# Patient Record
Sex: Male | Born: 1940 | Race: White | Hispanic: No | Smoking: Former smoker
Health system: Southern US, Community
[De-identification: ages and names within clinical notes are randomized; demographics above are authoritative.]

## PROBLEM LIST (undated history)

## (undated) DIAGNOSIS — K635 Polyp of colon: Secondary | ICD-10-CM

## (undated) DIAGNOSIS — N451 Epididymitis: Secondary | ICD-10-CM

## (undated) DIAGNOSIS — J0100 Acute maxillary sinusitis, unspecified: Secondary | ICD-10-CM

## (undated) DIAGNOSIS — J449 Chronic obstructive pulmonary disease, unspecified: Secondary | ICD-10-CM

## (undated) DIAGNOSIS — K602 Anal fissure, unspecified: Secondary | ICD-10-CM

## (undated) DIAGNOSIS — J069 Acute upper respiratory infection, unspecified: Secondary | ICD-10-CM

## (undated) DIAGNOSIS — T7840XA Allergy, unspecified, initial encounter: Secondary | ICD-10-CM

## (undated) DIAGNOSIS — L820 Inflamed seborrheic keratosis: Secondary | ICD-10-CM

## (undated) HISTORY — PX: HEMORRHOID BANDING: SHX5850

## (undated) HISTORY — DX: Anal fissure, unspecified: K60.2

## (undated) HISTORY — DX: Inflamed seborrheic keratosis: L82.0

## (undated) HISTORY — DX: Chronic obstructive pulmonary disease, unspecified: J44.9

## (undated) HISTORY — DX: Polyp of colon: K63.5

## (undated) HISTORY — DX: Epididymitis: N45.1

## (undated) HISTORY — DX: Hemochromatosis, unspecified: E83.119

## (undated) HISTORY — DX: Acute maxillary sinusitis, unspecified: J01.00

## (undated) HISTORY — DX: Acute upper respiratory infection, unspecified: J06.9

## (undated) HISTORY — DX: Allergy, unspecified, initial encounter: T78.40XA

---
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
## 2004-01-12 ENCOUNTER — Ambulatory Visit (HOSPITAL_COMMUNITY): Admission: RE | Admit: 2004-01-12 | Discharge: 2004-01-12 | Payer: Self-pay | Admitting: General Surgery

## 2004-01-12 ENCOUNTER — Encounter (INDEPENDENT_AMBULATORY_CARE_PROVIDER_SITE_OTHER): Payer: Self-pay | Admitting: *Deleted

## 2004-09-06 ENCOUNTER — Ambulatory Visit: Payer: Self-pay | Admitting: Internal Medicine

## 2004-10-06 ENCOUNTER — Ambulatory Visit: Payer: Self-pay | Admitting: Internal Medicine

## 2004-10-12 ENCOUNTER — Ambulatory Visit: Payer: Self-pay | Admitting: Internal Medicine

## 2004-11-03 ENCOUNTER — Ambulatory Visit: Payer: Self-pay | Admitting: Internal Medicine

## 2004-12-08 ENCOUNTER — Ambulatory Visit: Payer: Self-pay | Admitting: Internal Medicine

## 2005-01-03 ENCOUNTER — Ambulatory Visit: Payer: Self-pay | Admitting: Internal Medicine

## 2005-02-26 ENCOUNTER — Ambulatory Visit: Payer: Self-pay | Admitting: Internal Medicine

## 2005-05-25 ENCOUNTER — Ambulatory Visit: Payer: Self-pay | Admitting: Internal Medicine

## 2005-08-27 ENCOUNTER — Ambulatory Visit: Payer: Self-pay | Admitting: Internal Medicine

## 2005-11-26 ENCOUNTER — Ambulatory Visit: Payer: Self-pay | Admitting: Internal Medicine

## 2005-12-21 ENCOUNTER — Ambulatory Visit: Payer: Self-pay | Admitting: Internal Medicine

## 2006-01-21 ENCOUNTER — Ambulatory Visit: Payer: Self-pay | Admitting: Internal Medicine

## 2006-02-27 ENCOUNTER — Ambulatory Visit: Payer: Self-pay | Admitting: Internal Medicine

## 2006-03-26 ENCOUNTER — Ambulatory Visit: Payer: Self-pay | Admitting: Internal Medicine

## 2006-04-29 ENCOUNTER — Ambulatory Visit: Payer: Self-pay | Admitting: Internal Medicine

## 2006-05-27 ENCOUNTER — Ambulatory Visit: Payer: Self-pay | Admitting: Internal Medicine

## 2006-06-07 ENCOUNTER — Ambulatory Visit: Payer: Self-pay | Admitting: Family Medicine

## 2006-08-26 ENCOUNTER — Ambulatory Visit: Payer: Self-pay | Admitting: Internal Medicine

## 2006-08-26 LAB — CONVERTED CEMR LAB: Ferritin: 45.6 ng/mL (ref 22.0–322.0)

## 2006-11-25 ENCOUNTER — Ambulatory Visit: Payer: Self-pay | Admitting: Internal Medicine

## 2006-12-04 ENCOUNTER — Ambulatory Visit: Payer: Self-pay | Admitting: Internal Medicine

## 2007-01-31 ENCOUNTER — Ambulatory Visit: Payer: Self-pay | Admitting: Internal Medicine

## 2007-03-03 ENCOUNTER — Ambulatory Visit: Payer: Self-pay | Admitting: Internal Medicine

## 2007-04-02 ENCOUNTER — Ambulatory Visit: Payer: Self-pay | Admitting: Internal Medicine

## 2007-05-02 ENCOUNTER — Ambulatory Visit: Payer: Self-pay | Admitting: Internal Medicine

## 2007-05-02 LAB — CONVERTED CEMR LAB
HCT: 46.1 % (ref 39.0–52.0)
Hemoglobin: 16.3 g/dL (ref 13.0–17.0)

## 2007-06-02 ENCOUNTER — Ambulatory Visit: Payer: Self-pay | Admitting: Internal Medicine

## 2007-06-02 LAB — CONVERTED CEMR LAB
Basophils Relative: 0.3 % (ref 0.0–1.0)
Eosinophils Relative: 2.1 % (ref 0.0–5.0)
Hemoglobin: 15.6 g/dL (ref 13.0–17.0)
Monocytes Absolute: 0.6 10*3/uL (ref 0.2–0.7)
Monocytes Relative: 7.8 % (ref 3.0–11.0)
RDW: 12.6 % (ref 11.5–14.6)
WBC: 7.2 10*3/uL (ref 4.5–10.5)

## 2007-07-04 ENCOUNTER — Ambulatory Visit: Payer: Self-pay | Admitting: Internal Medicine

## 2007-07-04 LAB — CONVERTED CEMR LAB
Eosinophils Absolute: 0.2 10*3/uL (ref 0.0–0.6)
Eosinophils Relative: 3.1 % (ref 0.0–5.0)
HCT: 43 % (ref 39.0–52.0)
Lymphocytes Relative: 22.7 % (ref 12.0–46.0)
MCV: 97.8 fL (ref 78.0–100.0)
Neutro Abs: 3.2 10*3/uL (ref 1.4–7.7)
Neutrophils Relative %: 60.6 % (ref 43.0–77.0)
Platelets: 167 10*3/uL (ref 150–400)
WBC: 5.3 10*3/uL (ref 4.5–10.5)

## 2007-08-01 ENCOUNTER — Ambulatory Visit: Payer: Self-pay | Admitting: Internal Medicine

## 2007-08-15 ENCOUNTER — Ambulatory Visit: Payer: Self-pay | Admitting: Family Medicine

## 2007-08-15 DIAGNOSIS — L820 Inflamed seborrheic keratosis: Secondary | ICD-10-CM

## 2007-08-29 ENCOUNTER — Encounter (INDEPENDENT_AMBULATORY_CARE_PROVIDER_SITE_OTHER): Payer: Self-pay | Admitting: *Deleted

## 2007-08-29 ENCOUNTER — Telehealth (INDEPENDENT_AMBULATORY_CARE_PROVIDER_SITE_OTHER): Payer: Self-pay | Admitting: *Deleted

## 2007-08-29 ENCOUNTER — Ambulatory Visit: Payer: Self-pay | Admitting: Family Medicine

## 2007-08-29 LAB — CONVERTED CEMR LAB
Glucose, Bld: 87 mg/dL (ref 70–99)
Total CHOL/HDL Ratio: 3.7
Triglycerides: 112 mg/dL (ref 0–149)
VLDL: 22 mg/dL (ref 0–40)

## 2007-09-01 ENCOUNTER — Ambulatory Visit: Payer: Self-pay | Admitting: Internal Medicine

## 2007-10-02 ENCOUNTER — Ambulatory Visit: Payer: Self-pay | Admitting: Internal Medicine

## 2007-11-03 ENCOUNTER — Ambulatory Visit: Payer: Self-pay | Admitting: Internal Medicine

## 2007-12-01 ENCOUNTER — Ambulatory Visit: Payer: Self-pay | Admitting: Internal Medicine

## 2007-12-01 LAB — CONVERTED CEMR LAB: Ferritin: 22 ng/mL (ref 22.0–322.0)

## 2007-12-29 ENCOUNTER — Ambulatory Visit: Payer: Self-pay | Admitting: Internal Medicine

## 2007-12-29 LAB — CONVERTED CEMR LAB
Ferritin: 18 ng/mL — ABNORMAL LOW (ref 22.0–322.0)
HCT: 48 % (ref 39.0–52.0)

## 2008-01-19 ENCOUNTER — Ambulatory Visit: Payer: Self-pay | Admitting: Family Medicine

## 2008-01-19 DIAGNOSIS — J069 Acute upper respiratory infection, unspecified: Secondary | ICD-10-CM | POA: Insufficient documentation

## 2008-01-26 ENCOUNTER — Ambulatory Visit: Payer: Self-pay | Admitting: Internal Medicine

## 2008-01-26 LAB — CONVERTED CEMR LAB: Hemoglobin: 14.5 g/dL (ref 13.0–17.0)

## 2008-02-25 ENCOUNTER — Ambulatory Visit: Payer: Self-pay | Admitting: Internal Medicine

## 2008-04-05 ENCOUNTER — Ambulatory Visit: Payer: Self-pay | Admitting: Internal Medicine

## 2008-04-08 LAB — CONVERTED CEMR LAB
Ferritin: 10.7 ng/mL — ABNORMAL LOW (ref 22.0–322.0)
HCT: 42.4 % (ref 39.0–52.0)

## 2008-04-26 ENCOUNTER — Ambulatory Visit: Payer: Self-pay | Admitting: Internal Medicine

## 2008-04-26 LAB — CONVERTED CEMR LAB: Hemoglobin: 14.4 g/dL (ref 13.0–17.0)

## 2008-05-26 ENCOUNTER — Ambulatory Visit: Payer: Self-pay | Admitting: Internal Medicine

## 2008-05-26 LAB — CONVERTED CEMR LAB: Hemoglobin: 14 g/dL (ref 13.0–17.0)

## 2008-06-30 ENCOUNTER — Ambulatory Visit: Payer: Self-pay | Admitting: Internal Medicine

## 2008-07-01 LAB — CONVERTED CEMR LAB: Hemoglobin: 14.1 g/dL (ref 13.0–17.0)

## 2008-07-26 ENCOUNTER — Ambulatory Visit: Payer: Self-pay | Admitting: Internal Medicine

## 2008-08-30 ENCOUNTER — Ambulatory Visit: Payer: Self-pay | Admitting: Family Medicine

## 2008-09-01 ENCOUNTER — Ambulatory Visit: Payer: Self-pay | Admitting: Internal Medicine

## 2008-09-02 LAB — CONVERTED CEMR LAB
Basophils Absolute: 0 10*3/uL (ref 0.0–0.1)
Basophils Relative: 1 % (ref 0.0–3.0)
Ferritin: 9 ng/mL — ABNORMAL LOW (ref 22.0–322.0)
HCT: 40.9 % (ref 39.0–52.0)
Hemoglobin: 13.7 g/dL (ref 13.0–17.0)
MCHC: 33.5 g/dL (ref 30.0–36.0)
MCV: 90.4 fL (ref 78.0–100.0)
Monocytes Absolute: 0.5 10*3/uL (ref 0.1–1.0)
Neutro Abs: 2.9 10*3/uL (ref 1.4–7.7)
RBC: 4.53 M/uL (ref 4.22–5.81)
RDW: 13.1 % (ref 11.5–14.6)

## 2008-09-17 ENCOUNTER — Encounter (INDEPENDENT_AMBULATORY_CARE_PROVIDER_SITE_OTHER): Payer: Self-pay | Admitting: *Deleted

## 2008-09-27 ENCOUNTER — Ambulatory Visit: Payer: Self-pay | Admitting: Internal Medicine

## 2008-10-20 ENCOUNTER — Ambulatory Visit: Payer: Self-pay | Admitting: Internal Medicine

## 2008-10-20 ENCOUNTER — Encounter: Payer: Self-pay | Admitting: Internal Medicine

## 2008-10-20 HISTORY — PX: COLONOSCOPY W/ BIOPSIES: SHX1374

## 2008-10-25 ENCOUNTER — Encounter: Payer: Self-pay | Admitting: Internal Medicine

## 2008-12-24 ENCOUNTER — Ambulatory Visit: Payer: Self-pay | Admitting: Internal Medicine

## 2009-03-25 ENCOUNTER — Ambulatory Visit: Payer: Self-pay | Admitting: Internal Medicine

## 2009-03-28 LAB — CONVERTED CEMR LAB: Ferritin: 12.6 ng/mL — ABNORMAL LOW (ref 22.0–322.0)

## 2009-04-01 ENCOUNTER — Ambulatory Visit: Payer: Self-pay | Admitting: Internal Medicine

## 2009-06-28 ENCOUNTER — Ambulatory Visit: Payer: Self-pay | Admitting: Internal Medicine

## 2009-06-28 LAB — CONVERTED CEMR LAB
Ferritin: 18.2 ng/mL — ABNORMAL LOW (ref 22.0–322.0)
HCT: 44.9 % (ref 39.0–52.0)

## 2009-07-18 ENCOUNTER — Ambulatory Visit: Payer: Self-pay | Admitting: Family Medicine

## 2009-07-18 ENCOUNTER — Encounter: Payer: Self-pay | Admitting: Family Medicine

## 2009-07-18 DIAGNOSIS — R05 Cough: Secondary | ICD-10-CM

## 2009-07-20 ENCOUNTER — Encounter: Payer: Self-pay | Admitting: Family Medicine

## 2009-07-20 LAB — CONVERTED CEMR LAB
ALT: 14 units/L (ref 0–53)
Albumin: 4.1 g/dL (ref 3.5–5.2)
BUN: 15 mg/dL (ref 6–23)
Basophils Relative: 0.3 % (ref 0.0–3.0)
Bilirubin, Direct: 0.1 mg/dL (ref 0.0–0.3)
CO2: 31 meq/L (ref 19–32)
Chloride: 106 meq/L (ref 96–112)
Cholesterol: 192 mg/dL (ref 0–200)
Creatinine, Ser: 1.1 mg/dL (ref 0.4–1.5)
Eosinophils Absolute: 0.1 10*3/uL (ref 0.0–0.7)
Eosinophils Relative: 1.8 % (ref 0.0–5.0)
HCT: 44.5 % (ref 39.0–52.0)
Lymphs Abs: 1.5 10*3/uL (ref 0.7–4.0)
MCHC: 34 g/dL (ref 30.0–36.0)
MCV: 98.8 fL (ref 78.0–100.0)
Monocytes Absolute: 0.8 10*3/uL (ref 0.1–1.0)
Neutro Abs: 5.3 10*3/uL (ref 1.4–7.7)
Neutrophils Relative %: 68.6 % (ref 43.0–77.0)
PSA: 1.1 ng/mL (ref 0.10–4.00)
Potassium: 4.4 meq/L (ref 3.5–5.1)
RBC: 4.51 M/uL (ref 4.22–5.81)
TSH: 1.65 microintl units/mL (ref 0.35–5.50)
Total CHOL/HDL Ratio: 4
Total Protein: 6.8 g/dL (ref 6.0–8.3)
Triglycerides: 103 mg/dL (ref 0.0–149.0)
WBC: 7.7 10*3/uL (ref 4.5–10.5)

## 2009-07-21 ENCOUNTER — Encounter: Payer: Self-pay | Admitting: Family Medicine

## 2009-09-23 ENCOUNTER — Ambulatory Visit: Payer: Self-pay | Admitting: Internal Medicine

## 2009-09-26 LAB — CONVERTED CEMR LAB: HCT: 46.7 % (ref 39.0–52.0)

## 2009-12-23 ENCOUNTER — Ambulatory Visit: Payer: Self-pay | Admitting: Internal Medicine

## 2009-12-23 LAB — CONVERTED CEMR LAB
Basophils Absolute: 0 10*3/uL (ref 0.0–0.1)
Ferritin: 34.2 ng/mL (ref 22.0–322.0)
HCT: 47.6 % (ref 39.0–52.0)
Hemoglobin: 15.8 g/dL (ref 13.0–17.0)
Lymphs Abs: 0.8 10*3/uL (ref 0.7–4.0)
MCHC: 33.2 g/dL (ref 30.0–36.0)
MCV: 100.5 fL — ABNORMAL HIGH (ref 78.0–100.0)
Monocytes Absolute: 0.5 10*3/uL (ref 0.1–1.0)
Monocytes Relative: 9.9 % (ref 3.0–12.0)
Neutro Abs: 4.1 10*3/uL (ref 1.4–7.7)
Platelets: 181 10*3/uL (ref 150.0–400.0)
RDW: 12.3 % (ref 11.5–14.6)

## 2010-03-24 ENCOUNTER — Ambulatory Visit: Payer: Self-pay | Admitting: Internal Medicine

## 2010-03-29 LAB — CONVERTED CEMR LAB: Ferritin: 30.7 ng/mL (ref 22.0–322.0)

## 2010-06-23 ENCOUNTER — Ambulatory Visit: Payer: Self-pay | Admitting: Internal Medicine

## 2010-06-23 LAB — CONVERTED CEMR LAB
Basophils Relative: 0.3 % (ref 0.0–3.0)
Eosinophils Absolute: 0.2 10*3/uL (ref 0.0–0.7)
Eosinophils Relative: 3 % (ref 0.0–5.0)
HCT: 45 % (ref 39.0–52.0)
Lymphs Abs: 1.3 10*3/uL (ref 0.7–4.0)
MCHC: 35 g/dL (ref 30.0–36.0)
MCV: 98.7 fL (ref 78.0–100.0)
Monocytes Absolute: 0.7 10*3/uL (ref 0.1–1.0)
Platelets: 185 10*3/uL (ref 150.0–400.0)
WBC: 5.9 10*3/uL (ref 4.5–10.5)

## 2010-08-07 ENCOUNTER — Ambulatory Visit: Payer: Self-pay | Admitting: Family Medicine

## 2010-08-07 ENCOUNTER — Encounter: Payer: Self-pay | Admitting: Family Medicine

## 2010-08-25 ENCOUNTER — Ambulatory Visit: Payer: Self-pay | Admitting: Family Medicine

## 2010-08-25 LAB — CONVERTED CEMR LAB
Glucose, Urine, Semiquant: NEGATIVE
Protein, U semiquant: NEGATIVE
Urobilinogen, UA: 0.2
WBC Urine, dipstick: NEGATIVE

## 2010-08-26 ENCOUNTER — Encounter: Payer: Self-pay | Admitting: Family Medicine

## 2010-08-31 LAB — CONVERTED CEMR LAB
ALT: 14 units/L (ref 0–53)
AST: 16 units/L (ref 0–37)
BUN: 18 mg/dL (ref 6–23)
Basophils Relative: 0.1 % (ref 0.0–3.0)
CO2: 31 meq/L (ref 19–32)
Chloride: 106 meq/L (ref 96–112)
Cholesterol: 173 mg/dL (ref 0–200)
Creatinine, Ser: 1.1 mg/dL (ref 0.4–1.5)
Eosinophils Relative: 2.9 % (ref 0.0–5.0)
HCT: 48.3 % (ref 39.0–52.0)
LDL Cholesterol: 104 mg/dL — ABNORMAL HIGH (ref 0–99)
Lymphocytes Relative: 21.4 % (ref 12.0–46.0)
MCHC: 34.2 g/dL (ref 30.0–36.0)
MCV: 101.3 fL — ABNORMAL HIGH (ref 78.0–100.0)
Monocytes Relative: 13.2 % — ABNORMAL HIGH (ref 3.0–12.0)
Neutro Abs: 4.1 10*3/uL (ref 1.4–7.7)
Neutrophils Relative %: 62.4 % (ref 43.0–77.0)
Potassium: 4.3 meq/L (ref 3.5–5.1)
Total CHOL/HDL Ratio: 3
Total Protein: 6.3 g/dL (ref 6.0–8.3)
Triglycerides: 91 mg/dL (ref 0.0–149.0)

## 2010-09-06 ENCOUNTER — Ambulatory Visit: Payer: Self-pay | Admitting: Family Medicine

## 2010-09-08 LAB — CONVERTED CEMR LAB
Basophils Relative: 0.2 % (ref 0.0–3.0)
Eosinophils Absolute: 0.2 10*3/uL (ref 0.0–0.7)
Eosinophils Relative: 3.1 % (ref 0.0–5.0)
Hemoglobin: 16.1 g/dL (ref 13.0–17.0)
Lymphocytes Relative: 22.6 % (ref 12.0–46.0)
MCHC: 33.8 g/dL (ref 30.0–36.0)
MCV: 101.6 fL — ABNORMAL HIGH (ref 78.0–100.0)
Neutro Abs: 4 10*3/uL (ref 1.4–7.7)
Neutrophils Relative %: 63.1 % (ref 43.0–77.0)
RBC: 4.69 M/uL (ref 4.22–5.81)
WBC: 6.4 10*3/uL (ref 4.5–10.5)

## 2010-09-25 ENCOUNTER — Ambulatory Visit: Payer: Self-pay | Admitting: Internal Medicine

## 2010-09-26 LAB — CONVERTED CEMR LAB
Basophils Absolute: 0 10*3/uL (ref 0.0–0.1)
Basophils Relative: 0.4 % (ref 0.0–3.0)
Eosinophils Absolute: 0.2 10*3/uL (ref 0.0–0.7)
Hemoglobin: 15.4 g/dL (ref 13.0–17.0)
Lymphs Abs: 1.4 10*3/uL (ref 0.7–4.0)
MCHC: 34.9 g/dL (ref 30.0–36.0)
MCV: 99 fL (ref 78.0–100.0)
Monocytes Absolute: 0.7 10*3/uL (ref 0.1–1.0)
Neutro Abs: 6 10*3/uL (ref 1.4–7.7)
RBC: 4.46 M/uL (ref 4.22–5.81)
RDW: 13.3 % (ref 11.5–14.6)

## 2010-10-06 ENCOUNTER — Ambulatory Visit: Payer: Self-pay | Admitting: Family Medicine

## 2010-11-19 LAB — CONVERTED CEMR LAB
ALT: 17 units/L (ref 0–53)
AST: 18 units/L (ref 0–37)
Alkaline Phosphatase: 68 units/L (ref 39–117)
Basophils Absolute: 0.1 10*3/uL (ref 0.0–0.1)
Basophils Relative: 1.2 % (ref 0.0–3.0)
Bilirubin, Direct: 0.1 mg/dL (ref 0.0–0.3)
CO2: 30 meq/L (ref 19–32)
Chloride: 106 meq/L (ref 96–112)
Cholesterol: 167 mg/dL (ref 0–200)
Creatinine, Ser: 1.1 mg/dL (ref 0.4–1.5)
LDL Cholesterol: 97 mg/dL (ref 0–99)
Lymphocytes Relative: 22.5 % (ref 12.0–46.0)
MCHC: 34 g/dL (ref 30.0–36.0)
Neutrophils Relative %: 58.7 % (ref 43.0–77.0)
Platelets: 136 10*3/uL — ABNORMAL LOW (ref 150–400)
Potassium: 4.5 meq/L (ref 3.5–5.1)
RBC: 4.6 M/uL (ref 4.22–5.81)
Sodium: 141 meq/L (ref 135–145)
Total Bilirubin: 0.7 mg/dL (ref 0.3–1.2)
VLDL: 16 mg/dL (ref 0–40)

## 2010-11-21 NOTE — Assessment & Plan Note (Signed)
Summary: cpx/cbs   Vital Signs:  Patient profile:   70 year old male Height:      73 inches Weight:      181 pounds BMI:     23.97 Temp:     97.9 degrees F oral Pulse rate:   76 / minute Resp:     18 per minute BP sitting:   110 / 78  (left arm)  Vitals Entered By: Jeremy Johann CMA (August 07, 2010 1:09 PM) CC: yearly, not fasting  Does patient need assistance? Functional Status Self care, Cook/clean, Shopping, Social activities Ambulation Normal Comments Pt decline to get flu shot today pt is able to do all adls and can read and write pt sees no other doctors except Dentist[facility]  Dr Erroll Luna  Vision Screening:Left eye w/o correction: 20 / 30 Right Eye w/o correction: 20 / 25 Both eyes w/o correction:  20/ 25        Vision Entered By: Almeta Monas CMA Duncan Dull) (August 07, 2010 1:44 PM) 40db HL: Left  Right  Audiometry Comment: grossly normal    History of Present Illness: Pt here for cpe.   No complaints.    Preventive Screening-Counseling & Management  Alcohol-Tobacco     Alcohol drinks/day: <1     Alcohol type: scotch      Smoking Status: quit     Year Quit: 6/ 2008     Pack years: 50     Passive Smoke Exposure: no  Caffeine-Diet-Exercise     Caffeine use/day: 3     Does Patient Exercise: yes     Type of exercise: walking     Exercise (avg: min/session): >60     Times/week: 3  Hep-HIV-STD-Contraception     HIV Risk: no     Dental Visit-last 6 months yes     Dental Care Counseling: not indicated; dental care within six months  Safety-Violence-Falls     Seat Belt Use: 100      Sexual History:  divorced.        Drug Use:  never.    Current Medications (verified): 1)  Zostavax 25956 Unt/0.29ml Solr (Zoster Vaccine Live) .Marland Kitchen.. 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  Past Medical History: Reviewed history from 08/30/2008 and no changes required.  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 Appendectomy Inguinal herniorrhaphy Tonsillectomy  Family History: Reviewed history from 08/30/2008 and no changes required. Family History Diabetes 1st degree relative  Social History: Reviewed history from 08/30/2008 and no changes required. 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 Eyes:  Denies blurring, discharge, double vision, eye irritation, eye pain, halos, itching, light sensitivity, red eye, vision loss-1 eye, and vision loss-both eyes. ENT:  Denies decreased hearing, difficulty swallowing, ear discharge, earache, hoarseness, nasal congestion, nosebleeds, postnasal drainage, ringing in ears, sinus pressure, and sore throat. CV:  Denies bluish discoloration of lips or nails, chest pain or discomfort, difficulty  breathing at night, difficulty breathing while lying down, fainting, fatigue, leg cramps with exertion, lightheadness, near fainting, palpitations, shortness of breath with exertion, swelling of feet, swelling of hands, and weight gain. Resp:  Denies chest discomfort, chest pain with inspiration, cough, coughing up blood, excessive snoring, hypersomnolence, morning headaches, pleuritic, shortness of breath, sputum productive, and wheezing. GI:  Denies abdominal pain, bloody stools, change in bowel habits, constipation, dark tarry stools, diarrhea, excessive appetite, gas, hemorrhoids, indigestion, loss of appetite, nausea, and vomiting. GU:  Denies decreased libido, discharge, dysuria, erectile dysfunction, genital sores, hematuria, incontinence, nocturia, urinary frequency, and urinary hesitancy. MS:  Denies joint pain, joint redness, joint swelling, loss of strength, low back pain, mid back pain, muscle aches, muscle , cramps, muscle weakness, stiffness, and thoracic pain. Derm:  Denies changes in color of skin, changes in nail beds, dryness, excessive perspiration, flushing, hair loss, insect bite(s), itching, lesion(s), poor wound healing, and rash. Neuro:  Denies brief paralysis, difficulty with concentration, disturbances in coordination, falling down, headaches, inability to speak, memory loss, numbness, poor balance, seizures, sensation of room spinning, tingling, tremors, visual disturbances, and weakness. Psych:  Denies alternate hallucination ( auditory/visual), anxiety, depression, easily angered, easily tearful, irritability, mental problems, panic attacks, sense of great danger, suicidal thoughts/plans, thoughts of violence, unusual visions or sounds, and thoughts /plans of harming others. Endo:  Denies cold intolerance, excessive hunger, excessive thirst, excessive urination, heat intolerance, polyuria, and weight change. Heme:  Denies abnormal bruising, bleeding, enlarge lymph nodes, fevers,  pallor, and skin discoloration. Allergy:  Denies hives or rash, itching eyes, persistent infections, seasonal allergies, and sneezing.  Physical Exam  General:  Well-developed,well-nourished,in no acute distress; alert,appropriate and cooperative throughout examination Head:  Normocephalic and atraumatic without obvious abnormalities. No apparent alopecia or balding. Eyes:  pupils equal, pupils round, and pupils reactive to light.   Ears:  External ear exam shows no significant lesions or deformities.  Otoscopic examination reveals clear canals, tympanic membranes are intact bilaterally without bulging, retraction, inflammation or discharge. Hearing is grossly normal bilaterally. Nose:  External nasal examination shows no deformity or inflammation. Nasal mucosa are pink and moist without lesions or exudates. Mouth:  Oral mucosa and oropharynx without lesions or exudates.  Teeth in good repair. Neck:  No deformities, masses, or tenderness noted. Chest Wall:  No deformities, masses, tenderness or gynecomastia noted. Lungs:  Normal respiratory effort, chest expands symmetrically. Lungs are clear to auscultation, no crackles or wheezes. Heart:  normal rate and no murmur.   Abdomen:  Bowel sounds positive,abdomen soft and non-tender without masses, organomegaly or hernias noted. Rectal:  No external abnormalities noted. Normal sphincter tone. No rectal masses or tenderness. Genitalia:  Testes bilaterally descended without nodularity, tenderness or masses. No scrotal masses or lesions. No penis lesions or urethral discharge. Prostate:  Prostate gland firm and smooth, no enlargement, nodularity, tenderness, mass, asymmetry or induration. Msk:  normal ROM, no joint tenderness, no joint swelling, no joint warmth, no redness over joints, no joint deformities, no joint instability, and no crepitation.   Pulses:  R and L  carotid,radial,femoral,dorsalis pedis and posterior tibial pulses are full and equal  bilaterally Extremities:  No clubbing, cyanosis, edema, or deformity noted with normal full range of motion of all joints.   Neurologic:  No cranial nerve deficits noted. Station and gait are normal. Plantar reflexes are down-going bilaterally. DTRs are symmetrical throughout. Sensory, motor and coordinative functions appear intact. Skin:  Intact without suspicious lesions or rashes Cervical Nodes:  No lymphadenopathy noted Psych:  Cognition and judgment appear intact. Alert and cooperative with normal attention span and concentration. No apparent delusions, illusions, hallucinations   Impression & Recommendations:  Problem # 1:  PREVENTIVE HEALTH CARE (ICD-V70.0)  rto fasting labs Reviewed preventive care protocols, scheduled due services, and updated immunizations.  Orders: Medicare -1st Annual Wellness Visit [phone]) EKG w/ Interpretation (93000)  Complete Medication List: 1)  Zostavax 60454 Unt/0.24ml Solr (Zoster vaccine live) .Marland Kitchen.. 1 ml im x1  Patient Instructions: 1)  RTO fasting labs----V70.0  cbcd, bmp, hep, lipid, psa, UA mPrescriptions: ZOSTAVAX 09811 UNT/0.65ML SOLR (ZOSTER VACCINE LIVE) 1 ml IM x1  #1 x 0   Entered and Authorized by:   Loreen Freud DO   Signed by:   Loreen Freud DO on 08/07/2010   Method used:   Print then Give to Patient   RxID:   [id number]    Orders Added: 1)  Medicare -1st Annual Wellness Visit [G0438] 2)  EKG w/ Interpretation [93000]    Last Flu Vaccine:  refused (07/18/2009 11:14:30 AM) Flu Vaccine Next Due:  Refused Pneumovax Next Due:  Refused

## 2010-11-23 NOTE — Assessment & Plan Note (Signed)
Summary: TO REMOVE A SPOT//PH   Vital Signs:  Patient profile:   70 year old male Weight:      181.6 pounds Temp:     98.3 degrees F oral BP sitting:   118 / 72  (left arm) Cuff size:   regular  Vitals Entered By: Almeta Monas CMA Duncan Dull) (October 06, 2010 2:35 PM) CC: mole removal   History of Present Illness: pt here to have SK frozen.  Pt has had procedure before but it came back.  Current Medications (verified): 1)  Zostavax 10272 Unt/0.98ml Solr (Zoster Vaccine Live) .Marland Kitchen.. 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)  Past Surgical History: Last updated: 08/15/2007 Appendectomy Inguinal herniorrhaphy Tonsillectomy  Family History: Last updated: 08/30/2008 Family History Diabetes 1st degree relative  Social History: Last updated: 08/30/2008 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  Single Alcohol use-yes 2 drinks per weeks Former Smoker 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 Family History Diabetes 1st degree relative  Social History: Reviewed history from 08/30/2008 and no changes required. 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 1 ml im x1   Orders Added: 1)  Cryotherapy/Destruction benign or premalignant lesion (1st lesion)  [17000] 2)  Cryotherapy/Destruction benign or premalignant lesion (2nd-14th lesions) [17003]    Procedure Note  Mole Biopsy/Removal: The patient complains of redness and irritation. Onset of lesion: > 3 months Indication: inflamed lesion Consent signed: yes  Procedure # 1: cryo with liquid nitrogen    Size (in cm): 0.5 x 0.5    Region: lateral    Location: temporal-right    Comment: SK on R temple    Anesthesia: none  Procedure # 2: cryo with liquid nitrogen    Size (in cm): 1.0 x 0.5    Region: lateral    Location: mandibular-left    Anesthesia: none  Additional Instructions: procedure d/w pt---he has had this before so is aware of what to expect

## 2010-11-23 NOTE — Letter (Signed)
Summary: Wend[facility]reslow at [facility]  [address] [place], [place] [address]   Phone: [phone]  Fax: [phone]    I hereby consent to, and authorize:   to perform the following procedure:   on patient: Devin Schmidt Trimmer   utilizing the following anesthetic or anesthesia: local  administered by the following individual:   My physician has explained the following to me in language that I understand: The nature of the treatment/procedure, the risks of the procedure, the possible complications of the procedure, the expected benefits or effects of the treatment/procedure, and any alternatives to the procedure and their risks and benefits.  I understand that no guarantees have been made to me concerning the results of treatment, surgery, or other procedures.  If unforeseen conditions require additional procedures, and it is not reasonably practical to obtain my consent, I authorize my physician to proceed as he/she considers advisable and in my best interest, unless otherwise specified as follows.  Exceptions, if any:   I have read the previous information, and I understand it.  Any questions which may have occurred to me have been answered to my satisfaction.  ______________________________________________ Patient Signature/Date/Time  ______________________________________________ Parent, Guardian, or Legal Representative/Date (State your relationship to the patient)  ______________________________________________ Witness Signature/Date/Time

## 2010-11-23 NOTE — Miscellaneous (Signed)
Summary: Consent to Special Procedure  Consent to Special Procedure   Imported By: Lanelle Bal 10/13/2010 12:47:50  _____________________________________________________________________  External Attachment:    Type:   Image     Comment:   External Document

## 2010-11-24 ENCOUNTER — Telehealth (INDEPENDENT_AMBULATORY_CARE_PROVIDER_SITE_OTHER): Payer: Self-pay | Admitting: *Deleted

## 2010-12-06 ENCOUNTER — Encounter (INDEPENDENT_AMBULATORY_CARE_PROVIDER_SITE_OTHER): Payer: Self-pay | Admitting: *Deleted

## 2010-12-06 ENCOUNTER — Other Ambulatory Visit (INDEPENDENT_AMBULATORY_CARE_PROVIDER_SITE_OTHER): Payer: Medicare Other

## 2010-12-06 ENCOUNTER — Other Ambulatory Visit: Payer: Self-pay

## 2010-12-06 DIAGNOSIS — D7289 Other specified disorders of white blood cells: Secondary | ICD-10-CM

## 2010-12-07 NOTE — Progress Notes (Signed)
Summary: need diag code for [facility] on 2/15---added to appt  Phone Note Call from Patient   Caller: Patient Summary of Call: need diag code for CBCD lab for 2/15 at 10:00 Initial call taken by: Jerolyn Shin,  November 24, 2010 1:04 PM  Follow-up for Phone Call        288.8 Follow-up by: Almeta Monas CMA Duncan Dull),  November 24, 2010 3:17 PM

## 2010-12-08 ENCOUNTER — Other Ambulatory Visit: Payer: Self-pay | Admitting: Family Medicine

## 2010-12-08 ENCOUNTER — Encounter (INDEPENDENT_AMBULATORY_CARE_PROVIDER_SITE_OTHER): Payer: Self-pay | Admitting: *Deleted

## 2010-12-08 ENCOUNTER — Other Ambulatory Visit (INDEPENDENT_AMBULATORY_CARE_PROVIDER_SITE_OTHER): Payer: Medicare Other

## 2010-12-08 DIAGNOSIS — D7289 Other specified disorders of white blood cells: Secondary | ICD-10-CM

## 2010-12-08 LAB — CBC WITH DIFFERENTIAL/PLATELET
Basophils Relative: 0.2 % (ref 0.0–3.0)
Eosinophils Relative: 2.7 % (ref 0.0–5.0)
HCT: 47.1 % (ref 39.0–52.0)
Hemoglobin: 16.3 g/dL (ref 13.0–17.0)
Lymphs Abs: 1.5 10*3/uL (ref 0.7–4.0)
MCV: 100.5 fl — ABNORMAL HIGH (ref 78.0–100.0)
Monocytes Absolute: 0.8 10*3/uL (ref 0.1–1.0)
Monocytes Relative: 11.1 % (ref 3.0–12.0)
RBC: 4.69 Mil/uL (ref 4.22–5.81)
WBC: 7.5 10*3/uL (ref 4.5–10.5)

## 2010-12-19 ENCOUNTER — Encounter (INDEPENDENT_AMBULATORY_CARE_PROVIDER_SITE_OTHER): Payer: Self-pay | Admitting: *Deleted

## 2010-12-22 ENCOUNTER — Other Ambulatory Visit: Payer: Self-pay | Admitting: Internal Medicine

## 2010-12-22 ENCOUNTER — Encounter (INDEPENDENT_AMBULATORY_CARE_PROVIDER_SITE_OTHER): Payer: Self-pay | Admitting: *Deleted

## 2010-12-22 ENCOUNTER — Other Ambulatory Visit: Payer: Medicare Other

## 2010-12-22 LAB — CBC WITH DIFFERENTIAL/PLATELET
Basophils Absolute: 0 10*3/uL (ref 0.0–0.1)
Eosinophils Relative: 2.1 % (ref 0.0–5.0)
HCT: 45.5 % (ref 39.0–52.0)
Hemoglobin: 16 g/dL (ref 13.0–17.0)
Lymphs Abs: 1.2 10*3/uL (ref 0.7–4.0)
MCV: 99.3 fl (ref 78.0–100.0)
Monocytes Absolute: 0.7 10*3/uL (ref 0.1–1.0)
Neutro Abs: 3.7 10*3/uL (ref 1.4–7.7)
Platelets: 170 10*3/uL (ref 150.0–400.0)
RDW: 13.1 % (ref 11.5–14.6)

## 2010-12-25 ENCOUNTER — Other Ambulatory Visit: Payer: Self-pay | Admitting: Family Medicine

## 2010-12-25 ENCOUNTER — Encounter (INDEPENDENT_AMBULATORY_CARE_PROVIDER_SITE_OTHER): Payer: Self-pay | Admitting: *Deleted

## 2010-12-25 ENCOUNTER — Other Ambulatory Visit (INDEPENDENT_AMBULATORY_CARE_PROVIDER_SITE_OTHER): Payer: Medicare Other

## 2010-12-25 DIAGNOSIS — D7289 Other specified disorders of white blood cells: Secondary | ICD-10-CM

## 2010-12-25 LAB — CBC WITH DIFFERENTIAL/PLATELET
Basophils Absolute: 0 10*3/uL (ref 0.0–0.1)
Eosinophils Relative: 3.1 % (ref 0.0–5.0)
HCT: 43.8 % (ref 39.0–52.0)
Lymphs Abs: 1.1 10*3/uL (ref 0.7–4.0)
MCV: 100.6 fl — ABNORMAL HIGH (ref 78.0–100.0)
Monocytes Absolute: 0.6 10*3/uL (ref 0.1–1.0)
Neutrophils Relative %: 66.5 % (ref 43.0–77.0)
Platelets: 190 10*3/uL (ref 150.0–400.0)
RDW: 13.2 % (ref 11.5–14.6)
WBC: 5.4 10*3/uL (ref 4.5–10.5)

## 2010-12-28 NOTE — Letter (Signed)
Summary: Generic Letter  [facility] [place]  [address] [place], [place] [address]   Phone: [phone]  Fax: [phone]    12/19/2010    Devin Schmidt [address] [place], [place]  [address]  [place]     Dear [name],    I have made several attempts to contact you by telephone to review your previous results with no success. Please give me a call or call the office to schedule a repeat CBC with Diff. We will be available Monday through Friday from 8am until 5pm.       Sincerely,   Almeta Monas CMA Duncan Dull)  Appended Document: Generic Letter spoke with patient and letter not mailed

## 2011-03-09 NOTE — Op Note (Signed)
NAME:  Devin Schmidt, Devin Schmidt                          ACCOUNT NO.:  [id number]   MEDICAL RECORD NO.:  [id number]                   PATIENT TYPE:  AMB   LOCATION:  DAY                                  FACILITY:  [facility]   PHYSICIAN:  Leonie Man, M.D.                DATE OF BIRTH:  [birth year]   DATE OF PROCEDURE:  01/12/2004  DATE OF DISCHARGE:                                 OPERATIVE REPORT   PREOPERATIVE DIAGNOSES:  Hemorrhoidal disease, grade 3.   POSTOPERATIVE DIAGNOSES:  Hemorrhoidal disease, grade 3.   PROCEDURE:  PPH hemorrhoidopexy.   SURGEON:  Leonie Man, M.D.   ASSISTANT:  Nurse.   ANESTHESIA:  General.   The patient is a 70 year old recently retired man with persistent bleeding  and prolapse of his hemorrhoids who presents now for hemorrhoidopexy. He  understands the risks and potential benefits of the proposed procedure and  specifically the risk of postoperative bleeding, infection, tenesmus and  spasm.  He gives consent for surgery.   DESCRIPTION OF PROCEDURE:  Following the induction of satisfactory general  anesthesia, the patient is positioned in the prone jackknife position and  the perianal region is infiltrated with 0.25% Marcaine with epinephrine. The  anal sphincter is dilated up to approximately three fingerbreadths. The  operating anoscope is inserted into the anus and a pursestring suture of 2-0  Prolene is placed at approximately 5 cm above the dentate line.  The  pursestring suture is then drawn taught and tested.  There were no areas of  unretracted mucosa. The BPH stapling device was inserted with its anvil  above the pursestring and the pursestring sutures tied down above the anvil.  The sutures were then brought through appropriately and the EEA device was  closed down pulling the hemorrhoidal tissues and mucosa into the device.  This was held for one full minute.  The device was then fired and then  released.  The circle of mucosa was removed in  order to be intact and there  was no associated muscle with the mucosa. The suture line was then  thoroughly inspected for hemostasis and was noted to be dry.  A few bleeding  points were treated with electrocautery.  Sponge, instrument and sharp  counts were then verified. I placed a Gelfoam roll at the suture line for  additional hemostasis and sterile dressings were then placed on the wound.  The anesthetic reversed, patient removed from the operating room to the  recovery room in stable condition.  He tolerated the procedure well.                                               Leonie Man, M.D.    PB/MEDQ  D:  01/12/2004  T:  01/13/2004  Job:  [id number]

## 2011-03-26 ENCOUNTER — Other Ambulatory Visit: Payer: Self-pay | Admitting: Internal Medicine

## 2011-04-07 ENCOUNTER — Encounter: Payer: Self-pay | Admitting: Family Medicine

## 2011-04-30 ENCOUNTER — Ambulatory Visit (INDEPENDENT_AMBULATORY_CARE_PROVIDER_SITE_OTHER): Payer: Medicare Other | Admitting: Family Medicine

## 2011-04-30 ENCOUNTER — Ambulatory Visit: Payer: Medicare Other | Admitting: Family Medicine

## 2011-04-30 ENCOUNTER — Encounter: Payer: Self-pay | Admitting: Family Medicine

## 2011-04-30 VITALS — BP 124/72 | HR 81 | Temp 98.7°F | Wt 178.6 lb

## 2011-04-30 DIAGNOSIS — G250 Essential tremor: Secondary | ICD-10-CM | POA: Insufficient documentation

## 2011-04-30 DIAGNOSIS — R251 Tremor, unspecified: Secondary | ICD-10-CM

## 2011-04-30 MED ORDER — ALPRAZOLAM 0.25 MG PO TABS: ORAL_TABLET | ORAL | Status: DC

## 2011-04-30 NOTE — Progress Notes (Signed)
  Subjective:    Patient ID: MACALLAN ORD, male    DOB: [birth year], 70 y.o.   MRN: [id number]  HPI Pt here c/o tremor that is progressively getting worse.  No other complaints.  He notices it the worse when he is eating.     Review of Systems As above    Objective:   Physical Exam  Constitutional: He is oriented to person, place, and time. He appears well-developed and well-nourished.  Neurological: He is alert and oriented to person, place, and time.       When pt reaches out his hand shakes   Skin: Skin is warm and dry. No rash noted. No erythema. No pallor.  Psychiatric: He has a normal mood and affect. His behavior is normal. Judgment and thought content normal.          Assessment & Plan:

## 2011-04-30 NOTE — Assessment & Plan Note (Signed)
Xanax 0.25 mg tid prn  Neuro if no better

## 2011-04-30 NOTE — Patient Instructions (Signed)
Tremor Tremor is a rhythmic, involuntary muscular contraction characterized by oscillations (to-and-fro movements) of a part of the body. The most common of all involuntary movements, tremor can affect various body parts such as the hands, head, facial structures, vocal cords, trunk, and legs; most tremors, however, occur in the hands. Tremor often accompanies neurological disorders associated with aging. Although the disorder is not life-threatening, it can be responsible for functional disability and social embarrassment. TREATMENT There are many types of tremor and several ways in which tremor is classified. The most common classification is by behavioral context or position. There are five categories of tremor within this classification: resting, postural, kinetic, task-specific, and psychogenic. Resting or static tremor occurs when the muscle is at rest, for example when the hands are lying on the lap. This type of tremor is often seen in patients with Parkinson's disease. Postural tremor occurs when a patient attempts to maintain posture, such as holding the hands outstretched. Postural tremors include physiological tremor, essential tremor, tremor with basal ganglia disease (also seen in patients with Parkinson's disease), cerebellar postural tremor, tremor with peripheral neuropathy, post-traumatic tremor, and alcoholic tremor. Kinetic or intention (action) tremor occurs during purposeful movement, for example during finger-to-nose testing. Task-specific tremor appears when performing goal-oriented tasks such as handwriting, speaking, or standing. This group consists of primary writing tremor, vocal tremor, and orthostatic tremor. Psychogenic tremor occurs in both older and younger patients. The key feature of this tremor is that it dramatically lessens or disappears when the patient is distracted. WHAT WILL HAPPEN There are some treatment options available for tremor; the appropriate treatment depends  on accurate diagnosis of the cause. Some tremors respond to treatment of the underlying condition, for example in some cases of psychogenic tremor treating the patient's underlying mental problem may cause the tremor to disappear. Also, patients with tremor due to Parkinson's disease may be treated with Levodopa drug therapy. Symptomatic drug therapy is available for several other tremors as well. For those cases of tremor in which there is no effective drug treatment, physical measures such as teaching the patient to brace the affected limb during the tremor are sometimes useful. Surgical intervention such as thalamotomy or deep brain stimulation may be useful in certain cases. Document Released: 09/28/2002 Document Re-Released: 03/28/2010 [facility] Patient Information 2011 [place], [place].

## 2011-06-27 ENCOUNTER — Other Ambulatory Visit: Payer: Self-pay

## 2011-06-27 ENCOUNTER — Other Ambulatory Visit (INDEPENDENT_AMBULATORY_CARE_PROVIDER_SITE_OTHER): Payer: Medicare Other

## 2011-06-27 LAB — HEMATOCRIT: HCT: 46.6 % (ref 39.0–52.0)

## 2011-06-27 NOTE — Progress Notes (Signed)
Quick Note:  Have him repeat phlebotomy with a ferritin in 1 month please (sooner than in past but ferritin 54 in March and need to keep it below 50. Will advise further plans after next phlebotomy ______

## 2011-07-27 ENCOUNTER — Other Ambulatory Visit (INDEPENDENT_AMBULATORY_CARE_PROVIDER_SITE_OTHER): Payer: Medicare Other

## 2011-07-27 LAB — HEMATOCRIT: HCT: 47 % (ref 39.0–52.0)

## 2011-07-31 NOTE — Progress Notes (Signed)
Quick Note:  Repeat phlebotomy in 2 months with Hgb and ferritin again then dx: hemochroomatosis ______

## 2011-08-01 ENCOUNTER — Other Ambulatory Visit: Payer: Self-pay

## 2011-08-14 ENCOUNTER — Encounter: Payer: Self-pay | Admitting: Family Medicine

## 2011-08-15 ENCOUNTER — Encounter: Payer: Self-pay | Admitting: Family Medicine

## 2011-08-15 ENCOUNTER — Ambulatory Visit (INDEPENDENT_AMBULATORY_CARE_PROVIDER_SITE_OTHER): Payer: Medicare Other | Admitting: Family Medicine

## 2011-08-15 DIAGNOSIS — R319 Hematuria, unspecified: Secondary | ICD-10-CM

## 2011-08-15 DIAGNOSIS — Z0000 Encounter for general adult medical examination without abnormal findings: Secondary | ICD-10-CM

## 2011-08-15 DIAGNOSIS — Z125 Encounter for screening for malignant neoplasm of prostate: Secondary | ICD-10-CM

## 2011-08-15 LAB — LIPID PANEL
Cholesterol: 188 mg/dL (ref 0–200)
HDL: 56.4 mg/dL (ref >39.00)

## 2011-08-15 LAB — BASIC METABOLIC PANEL
BUN: 17 mg/dL (ref 6–23)
Chloride: 105 mEq/L (ref 96–112)
Creatinine, Ser: 1.2 mg/dL (ref 0.4–1.5)
Glucose, Bld: 95 mg/dL (ref 70–99)

## 2011-08-15 LAB — HEPATIC FUNCTION PANEL
Albumin: 4.2 g/dL (ref 3.5–5.2)
Alkaline Phosphatase: 70 U/L (ref 39–117)
Total Protein: 6.8 g/dL (ref 6.0–8.3)

## 2011-08-15 LAB — CBC WITH DIFFERENTIAL/PLATELET
Basophils Absolute: 0 10*3/uL (ref 0.0–0.1)
Eosinophils Absolute: 0.3 10*3/uL (ref 0.0–0.7)
Eosinophils Relative: 3.5 % (ref 0.0–5.0)
MCV: 101.3 fl — ABNORMAL HIGH (ref 78.0–100.0)
Monocytes Absolute: 0.8 10*3/uL (ref 0.1–1.0)
Neutrophils Relative %: 66.6 % (ref 43.0–77.0)
Platelets: 171 10*3/uL (ref 150.0–400.0)
RDW: 13.8 % (ref 11.5–14.6)
WBC: 7.2 10*3/uL (ref 4.5–10.5)

## 2011-08-15 LAB — POCT URINALYSIS DIPSTICK
Bilirubin, UA: NEGATIVE
Ketones, UA: NEGATIVE
Leukocytes, UA: NEGATIVE
Spec Grav, UA: 1.01
pH, UA: 5

## 2011-08-15 LAB — PSA: PSA: 1.17 ng/mL (ref 0.10–4.00)

## 2011-08-15 NOTE — Progress Notes (Signed)
Subjective:    Devin Schmidt is a 70 y.o. male who presents for Medicare Annual/Subsequent preventive examination.   Preventive Screening-Counseling & Management  Tobacco History  Smoking status  . Former Smoker  Smokeless tobacco  . Never Used    Problems Prior to Visit 1. none  Current Problems (verified) Patient Active Problem List  Diagnoses  . HEMOCHROMATOSIS  . URI  . SEBORRHEIC KERATOSIS, INFLAMED  . COUGH  . HEMOCHROMATOSIS  . LYMPHOPENIA  . Tremor, essential    Medications Prior to Visit No current outpatient prescriptions on file prior to visit.    Current Medications (verified) No current outpatient prescriptions on file.     Allergies (verified) Review of patient's allergies indicates no known allergies.   PAST HISTORY  Family History Family History  Problem Relation Age of Onset  . Diabetes      Social History History  Substance Use Topics  . Smoking status: Former Games developer  . Smokeless tobacco: Never Used  . Alcohol Use: 1.0 oz/week    2 drink(s) per week    Are there smokers in your home (other than you)?  No  Risk Factors Current exercise habits: The patient does not participate in regular exercise at present. --- works on golf course Dietary issues discussed: na   Cardiac risk factors: advanced age (older than 28 for men, 16 for women) and male gender.  Depression Screen (Note: if answer to either of the following is "Yes", a more complete depression screening is indicated)   Q1: Over the past two weeks, have you felt down, depressed or hopeless? No  Q2: Over the past two weeks, have you felt little interest or pleasure in doing things? No  Have you lost interest or pleasure in daily life? No  Do you often feel hopeless? No  Do you cry easily over simple problems? No  Activities of Daily Living In your present state of health, do you have any difficulty performing the following activities?:  Driving? No Managing money?   No Feeding yourself? No Getting from bed to chair? No Climbing a flight of stairs? No Preparing food and eating?: No Bathing or showering? No Getting dressed: No Getting to the toilet? No Using the toilet:No Moving around from place to place: No In the past year have you fallen or had a near fall?:No   Are you sexually active?  No  Do you have more than one partner?  No  Hearing Difficulties: No Do you often ask people to speak up or repeat themselves? No Do you experience ringing or noises in your ears? No Do you have difficulty understanding soft or whispered voices? No   Do you feel that you have a problem with memory? No  Do you often misplace items? No  Do you feel safe at home?  No  Cognitive Testing  Alert? Yes  Normal Appearance?Yes  Oriented to person? Yes  Place? Yes   Time? Yes  Recall of three objects?  Yes  Can perform simple calculations? Yes  Displays appropriate judgment?Yes  Can read the correct time from a watch face?Yes   Advanced Directives have been discussed with the patient? Yes   List the Names of Other Physician/Practitioners you currently use: 1.  Dr Cyril Loosen any recent Medical Services you may have received from other than [facility] providers in the past year (date may be approximate).  There is no immunization history for the selected administration types on file for this patient.  Screening Tests  Health Maintenance  Topic Date Due  . Tetanus/tdap  11/19/1957  . Zostavax  11/19/1998  . Pneumococcal Polysaccharide Vaccine Age 81 And Over  11/20/2003  . Influenza Vaccine  07/23/2011  . Colonoscopy  10/20/2018    All answers were reviewed with the patient and necessary referrals were made:  Loreen Freud, DO   08/15/2011   History reviewed: allergies, current medications, past family history, past medical history, past social history, past surgical history and problem list  Review of Systems  Review of Systems  Constitutional:  Negative for activity change, appetite change and fatigue.  HENT: Negative for hearing loss, congestion, tinnitus and ear discharge.  dentist-- q36m Eyes: Negative for visual disturbance (see opt-- due) Respiratory: Negative for cough, chest tightness and shortness of breath.   Cardiovascular: Negative for chest pain, palpitations and leg swelling.  Gastrointestinal: Negative for abdominal pain, diarrhea, constipation and abdominal distention.  Genitourinary: Negative for urgency, frequency, decreased urine volume and difficulty urinating.  Musculoskeletal: Negative for back pain, arthralgias and gait problem.  Skin: Negative for color change, pallor and rash.  Neurological: Negative for dizziness, light-headedness, numbness and headaches.  Hematological: Negative for adenopathy. Does not bruise/bleed easily.  Psychiatric/Behavioral: Negative for suicidal ideas, confusion, sleep disturbance, self-injury, dysphoric mood, decreased concentration and agitation.  Pt is able to read and write and can do all ADLs No risk for falling No abuse/ violence in home     Objective:     Vision by Snellen chart: right eye:20/20, left eye:20/20 Blood pressure 120/74, pulse 73, temperature 97.9 F (36.6 C), temperature source Oral, height 6' (1.829 m), weight 177 lb 6.4 oz (80.468 kg), SpO2 98.00%. Body mass index is 24.06 kg/(m^2). BP 120/74  Pulse 73  Temp(Src) 97.9 F (36.6 C) (Oral)  Ht 6' (1.829 m)  Wt 177 lb 6.4 oz (80.468 kg)  BMI 24.06 kg/m2  SpO2 98%  General Appearance:    Alert, cooperative, no distress, appears stated age  Head:    Normocephalic, without obvious abnormality, atraumatic  Eyes:    PERRL, conjunctiva/corneas clear, EOM's intact, fundi    benign, both eyes       Ears:    Normal TM's and external ear canals, both ears  Nose:   Nares normal, septum midline, mucosa normal, no drainage   or sinus tenderness  Throat:   Lips, mucosa, and tongue normal; teeth and gums normal    Neck:   Supple, symmetrical, trachea midline, no adenopathy;       thyroid:  No enlargement/tenderness/nodules; no carotid   bruit or JVD  Back:     Symmetric, no curvature, ROM normal, no CVA tenderness  Lungs:     Clear to auscultation bilaterally, respirations unlabored  Chest wall:    No tenderness or deformity  Heart:    Regular rate and rhythm, S1 and S2 normal, no murmur, rub   or gallop  Abdomen:     Soft, non-tender, bowel sounds active all four quadrants,    no masses, no organomegaly  Genitalia:    Normal male without lesion, discharge or tenderness  Rectal:    Normal tone, normal prostate, no masses or tenderness;   guaiac negative stool  Extremities:   Extremities normal, atraumatic, no cyanosis or edema  Pulses:   2+ and symmetric all extremities  Skin:   Skin color, texture, turgor normal, no rashes or lesions  Lymph nodes:   Cervical, supraclavicular, and axillary nodes normal  Neurologic:   CNII-XII intact. Normal strength, sensation  and reflexes      throughout       Assessment:     cpe--  ghm utd             Check labs              See AVS            Plan:     During the course of the visit the patient was educated and counseled about appropriate screening and preventive services including:    Pneumococcal vaccine   Influenza vaccine  Hepatitis B vaccine  Prostate cancer screening  Colorectal cancer screening  Advanced directives: has an advanced directive - a copy HAS NOT been provided.  Diet review for nutrition referral? Yes ____  Not Indicated _x___   Patient Instructions (the written plan) was given to the patient.  Medicare Attestation I have personally reviewed: The patient's medical and social history Their use of alcohol, tobacco or illicit drugs Their current medications and supplements The patient's functional ability including ADLs,fall risks, home safety risks, cognitive, and hearing and visual impairment Diet and  physical activities Evidence for depression or mood disorders  The patient's weight, height, BMI, and visual acuity have been recorded in the chart.  I have made referrals, counseling, and provided education to the patient based on review of the above and I have provided the patient with a written personalized care plan for preventive services.     Loreen Freud, DO   08/15/2011

## 2011-08-15 NOTE — Progress Notes (Signed)
Addended by: Legrand Como on: 08/15/2011 01:50 PM   Modules accepted: Orders

## 2011-08-15 NOTE — Patient Instructions (Signed)
Preventative Care for Adults, Male A healthy lifestyle and preventative care can promote health and wellness. Preventative health guidelines for men include the following key practices:  A routine yearly physical is a good way to check with your caregiver about your health and preventative screening. It is a chance to share any concerns and updates on your health, and to receive a thorough exam.   Visit your dentist for a routine exam and preventative care every 6 months. Brush your teeth twice a day and floss once a day. Good oral hygiene prevents tooth decay and gum disease.   The frequency of eye exams is based on your age, health, family medical history, use of contact lenses, and other factors. Follow your caregiver's recommendations for frequency of eye exams.   Eat a healthy diet. Foods like vegetables, fruits, whole grains, low-fat dairy products, and lean protein foods contain the nutrients you need without too many calories. Decrease your intake of foods high in solid fats, added sugars, and salt. Eat the right amount of calories for you.Get information about a proper diet from your caregiver, if necessary.   Regular physical exercise is one of the most important things you can do for your health. Most adults should get at least 150 minutes of moderate-intensity exercise (any activity that increases your heart rate and causes you to sweat) each week. In addition, most adults need muscle-strengthening exercises on 2 or more days a week.   Maintain a healthy weight. The body mass index (BMI) is a screening tool to identify possible weight problems. It provides an estimate of body fat based on height and weight. Your caregiver can help determine your BMI, and can help you achieve or maintain a healthy weight.For adults 20 years and older:   A BMI below 18.5 is considered underweight.   A BMI of 18.5 to 24.9 is normal.   A BMI of 25 to 29.9 is considered overweight.   A BMI of 30 and  above is considered obese.   Maintain normal blood lipids and cholesterol levels by exercising and minimizing your intake of saturated fat. Eat a balanced diet with plenty of fruit and vegetables. Blood tests for lipids and cholesterol should begin at age 20 and be repeated every 5 years. If your lipid or cholesterol levels are high, you are over 50, or you are a high risk for heart disease, you may need your cholesterol levels checked more frequently.Ongoing high lipid and cholesterol levels should be treated with medicines if diet and exercise are not effective.   If you smoke, find out from your caregiver how to quit. If you do not use tobacco, do not start.   If you choose to drink alcohol, do not exceed 2 drinks per day. One drink is considered to be 12 ounces (355 mL) of beer, 5 ounces (148 mL) of wine, or 1.5 ounces (44 mL) of liquor.   Avoid use of street drugs. Do not share needles with anyone. Ask for help if you need support or instructions about stopping the use of drugs.   High blood pressure causes heart disease and increases the risk of stroke. Your blood pressure should be checked at least every 1 to 2 years. Ongoing high blood pressure should be treated with medicines, if weight loss and exercise are not effective.   If you are 45 to 72 years old, ask your caregiver if you should take aspirin to prevent heart disease.   Diabetes screening involves taking a blood   sample to check your fasting blood sugar level. This should be done once every 3 years, after age 45, if you are within normal weight and without risk factors for diabetes. Testing should be considered at a younger age or be carried out more frequently if you are overweight and have at least 1 risk factor for diabetes.   Colorectal cancer can be detected and often prevented. Most routine colorectal cancer screening begins at the age of 50 and continues through age 75. However, your caregiver may recommend screening at an  earlier age if you have risk factors for colon cancer. On a yearly basis, your caregiver may provide home test kits to check for hidden blood in the stool. Use of a small camera at the end of a tube, to directly examine the colon (sigmoidoscopy or colonoscopy), can detect the earliest forms of colorectal cancer. Talk to your caregiver about this at age 50, when routine screening begins. Direct examination of the colon should be repeated every 5 to 10 years through age 75, unless early forms of pre-cancerous polyps or small growths are found.   Practice safe sex. Use condoms and avoid high-risk sexual practices to reduce the spread of sexually transmitted infections (STIs). STIs include gonorrhea, chlamydia, syphilis, trichomonas, herpes, HPV, and human immunodeficiency virus (HIV). Herpes, HIV, and HPV are viral illnesses that have no cure. They can result in disability, cancer, and death.   A one-time screening for abdominal aortic aneurysm (AAA) and surgical repair of large AAAs by sound wave imaging (ultrasonography) is recommended for ages 65 to 75 years who are current or former smokers.   Healthy men should no longer receive prostate-specific antigen (PSA) blood tests as part of routine cancer screening. Consult with your caregiver about prostate cancer screening.   Use sunscreen with skin protection factor (SPF) of 30 or more. Apply sunscreen liberally and repeatedly throughout the day. You should seek shade when your shadow is shorter than you. Protect yourself by wearing long sleeves, pants, a wide-brimmed hat, and sunglasses year round, whenever you are outdoors.   Once a month, do a whole body skin exam, using a mirror to look at the skin on your back. Notify your caregiver of new moles, moles that have irregular borders, moles that are larger than a pencil eraser, or moles that have changed in shape or color.   Stay current with required immunizations.   Influenza. You need a dose every  fall (or winter). The composition of the flu vaccine changes each year, so being vaccinated once is not enough.   Pneumococcal polysaccharide. You need 1 to 2 doses if you smoke cigarettes or if you have certain chronic medical conditions. You need 1 dose at age 65 (or older) if you have never been vaccinated.   Tetanus, diphtheria, pertussis (Tdap, Td). Get 1 dose of Tdap vaccine if you are younger than age 65 years, are over 65 and have contact with an infant, are a healthcare worker, or simply want to be protected from whooping cough. After that, you need a Td booster dose every 10 years. Consult your caregiver if you have not had at least 3 tetanus and diphtheria-containing shots sometime in your life or have a deep or dirty wound.   HPV. This vaccine is recommended for males 13 through 72 years of age. This vaccine may be given to men 22 through 72 years of age who have not completed the 3 dose series. It is recommended for men through age 26   who have sex with men or whose immune system is weakened because of HIV infection, other illness, or medications. The vaccine is given in 3 doses over 6 months.   Measles, mumps, rubella (MMR). You need at least 1 dose of MMR if you were born in 1957 or later. You may also need a 2nd dose.   Meningococcal. If you are age 19 to 21 years and a first-year college student living in a residence hall, or have one of several medical conditions, you need to get vaccinated against meningococcal disease. You may also need additional booster doses.   Zoster (shingles). If you are age 60 years or older, you should get this vaccine.   Varicella (chickenpox). If you have never had chickenpox or you were vaccinated but received only 1 dose, talk to your caregiver to find out if you need this vaccine.   Hepatitis A. You need this vaccine if you have a specific risk factor for hepatitis A virus infection, or you simply wish to be protected from this disease. The vaccine is  usually given as 2 doses, 6 to 18 months apart.   Hepatitis B. You need this vaccine if you have a specific risk factor for hepatitis B virus infection or you simply wish to be protected from this disease. The vaccine is given in 3 doses, usually over 6 months.  Preventative Service / Frequency Ages 19 to 39  Blood pressure check.** / Every 1 to 2 years.   Lipid and cholesterol check.**/ Every 5 years beginning at age 20.   Skin self-exam. / Monthly.   Influenza immunization.** / Every year.   Pneumococcal polysaccharide immunization.** / 1 to 2 doses if you smoke cigarettes or if you have certain chronic medical conditions.   Tetanus, diphtheria, pertussis (Tdap,Td) immunization. / A one-time dose of Tdap vaccine. After that, you need a Td booster dose every 10 years.   HPV immunization. / 3 doses over 6 months, if 26 and younger.   Measles, mumps, rubella (MMR) immunization. / You need at least 1 dose of MMR if you were born in 1957 or later. You may also need a 2nd dose.   Meningococcal immunization. / 1 dose if you are age 19 to 21 years and a first-year college student living in a residence hall, or have one of several medical conditions, you need to get vaccinated against meningococcal disease. You may also need additional booster doses.   Varicella immunization. **/ Consult your caregiver.   Hepatitis A immunization. ** / Consult your caregiver. 2 doses, 6 to 18 months apart.   Hepatitis B immunization.** / Consult your caregiver. 3 doses usually over 6 months.  Ages 40 to 64  Blood pressure check.** / Every 1 to 2 years.   Lipid and cholesterol check.**/ Every 5 years beginning at age 20.   Fecal occult blood test (FOBT) of stool. / Every year beginning at age 50 and continuing until age 75. You may not have to do this test if you get colonoscopy every 10 years.   Flexible sigmoidoscopy** or colonoscopy.** / Every 5 years for a flexible sigmoidoscopy or every 10 years for  a colonoscopy beginning at age 50 and continuing until age 75.   Skin self-exam. / Monthly.   Influenza immunization.** / Every year.   Pneumococcal polysaccharide immunization.** / 1 to 2 doses if you smoke cigarettes or if you have certain chronic medical conditions.   Tetanus, diphtheria, pertussis (Tdap/Td) immunization.** / A one-time dose of   Tdap vaccine. After that, you need a Td booster dose every 10 years.   Measles, mumps, rubella (MMR) immunization. / You need at least 1 dose of MMR if you were born in 1957 or later. You may also need a 2nd dose.   Varicella immunization. **/ Consult your caregiver.   Meningococcal immunization.** / Consult your caregiver.   Hepatitis A immunization. ** / Consult your caregiver. 2 doses, 6 to 18 months apart.   Hepatitis B immunization.** / Consult your caregiver. 3 doses, usually over 6 months.  Ages 65 and over  Blood pressure check.** / Every 1 to 2 years.   Lipid and cholesterol check.**/ Every 5 years beginning at age 20.   Fecal occult blood test (FOBT) of stool. / Every year beginning at age 50 and continuing until age 75. You may not have to do this test if you get colonoscopy every 10 years.   Flexible sigmoidoscopy** or colonoscopy.** / Every 5 years for a flexible sigmoidoscopy or every 10 years for a colonoscopy beginning at age 50 and continuing until age 75.   Abdominal aortic aneurysm (AAA) screening.** / A one-time screening for ages 65 to 75 years who are current or former smokers.   Skin self-exam. / Monthly.   Influenza immunization.** / Every year.   Pneumococcal polysaccharide immunization.** / 1 dose at age 65 (or older) if you have never been vaccinated.   Tetanus, diphtheria, pertussis (Tdap, Td) immunization. / A one-time dose of Tdap vaccine if you are over 65 and have contact with an infant, are a healthcare worker, or simply want to be protected from whooping cough. After that, you need a Td booster dose  every 10 years.   Varicella immunization. **/ Consult your caregiver.   Meningococcal immunization.** / Consult your caregiver.   Hepatitis A immunization. ** / Consult your caregiver. 2 doses, 6 to 18 months apart.   Hepatitis B immunization.** / Check with your caregiver. 3 doses, usually over 6 months.  **Family history and personal history of risk and conditions may change your caregiver's recommendations. Document Released: 12/04/2001 Document Revised: 06/20/2011 Document Reviewed: 03/05/2011 [employer] Patient Information 2012 [employer]. 

## 2011-08-20 LAB — URINE CULTURE

## 2011-09-28 ENCOUNTER — Other Ambulatory Visit (INDEPENDENT_AMBULATORY_CARE_PROVIDER_SITE_OTHER): Payer: Medicare Other

## 2011-09-28 LAB — FERRITIN: Ferritin: 34 ng/mL (ref 22.0–322.0)

## 2011-09-28 NOTE — Progress Notes (Signed)
Quick Note:  Continue phlebotomy for hemochromatosis every 2 months. Do not need a ferritin next time. ______

## 2011-11-26 ENCOUNTER — Other Ambulatory Visit (INDEPENDENT_AMBULATORY_CARE_PROVIDER_SITE_OTHER): Payer: Medicare Other

## 2011-11-26 NOTE — Progress Notes (Signed)
Quick Note:  Repeat phlebotomy in 2 months ______

## 2012-02-07 ENCOUNTER — Other Ambulatory Visit (INDEPENDENT_AMBULATORY_CARE_PROVIDER_SITE_OTHER): Payer: Medicare Other

## 2012-02-07 LAB — HEMATOCRIT: HCT: 47.3 % (ref 39.0–52.0)

## 2012-02-08 ENCOUNTER — Other Ambulatory Visit: Payer: Self-pay

## 2012-02-08 NOTE — Progress Notes (Signed)
Quick Note:  Repeat phlebotomy in 2 months Check ferritin then also ______ 

## 2012-03-26 ENCOUNTER — Other Ambulatory Visit (INDEPENDENT_AMBULATORY_CARE_PROVIDER_SITE_OTHER): Payer: Medicare Other

## 2012-03-26 ENCOUNTER — Other Ambulatory Visit: Payer: Self-pay

## 2012-03-26 LAB — HEMOGLOBIN: Hemoglobin: 16.5 g/dL (ref 13.0–17.0)

## 2012-03-26 LAB — HEMATOCRIT: HCT: 48.9 % (ref 39.0–52.0)

## 2012-03-26 NOTE — Progress Notes (Signed)
Quick Note:  Phlebotomy again in 2 months  Draw ferritin then ______

## 2012-05-23 ENCOUNTER — Other Ambulatory Visit (INDEPENDENT_AMBULATORY_CARE_PROVIDER_SITE_OTHER): Payer: Medicare Other

## 2012-05-23 LAB — HEMOGLOBIN: Hemoglobin: 16.1 g/dL (ref 13.0–17.0)

## 2012-05-23 LAB — FERRITIN: Ferritin: 35.7 ng/mL (ref 22.0–322.0)

## 2012-05-23 LAB — HEMATOCRIT: HCT: 47.7 % (ref 39.0–52.0)

## 2012-07-24 ENCOUNTER — Other Ambulatory Visit (INDEPENDENT_AMBULATORY_CARE_PROVIDER_SITE_OTHER): Payer: Medicare Other

## 2012-07-24 ENCOUNTER — Other Ambulatory Visit: Payer: Self-pay

## 2012-07-24 NOTE — Progress Notes (Signed)
Quick Note:  Continue phlebotomy every 2 months ______ 

## 2012-08-22 ENCOUNTER — Ambulatory Visit (INDEPENDENT_AMBULATORY_CARE_PROVIDER_SITE_OTHER): Payer: Medicare Other | Admitting: Family Medicine

## 2012-08-22 ENCOUNTER — Encounter: Payer: Self-pay | Admitting: Family Medicine

## 2012-08-22 VITALS — BP 116/74 | HR 72 | Temp 98.2°F | Ht 72.0 in | Wt 177.2 lb

## 2012-08-22 DIAGNOSIS — Z125 Encounter for screening for malignant neoplasm of prostate: Secondary | ICD-10-CM

## 2012-08-22 DIAGNOSIS — Z0000 Encounter for general adult medical examination without abnormal findings: Secondary | ICD-10-CM

## 2012-08-22 DIAGNOSIS — D229 Melanocytic nevi, unspecified: Secondary | ICD-10-CM

## 2012-08-22 NOTE — Assessment & Plan Note (Signed)
Per GI

## 2012-08-22 NOTE — Patient Instructions (Addendum)
Preventive Care for Adults, Male A healthy lifestyle and preventive care can promote health and wellness. Preventive health guidelines for men include the following key practices:  A routine yearly physical is a good way to check with your caregiver about your health and preventative screening. It is a chance to share any concerns and updates on your health, and to receive a thorough exam.  Visit your dentist for a routine exam and preventative care every 6 months. Brush your teeth twice a day and floss once a day. Good oral hygiene prevents tooth decay and gum disease.  The frequency of eye exams is based on your age, health, family medical history, use of contact lenses, and other factors. Follow your caregiver's recommendations for frequency of eye exams.  Eat a healthy diet. Foods like vegetables, fruits, whole grains, low-fat dairy products, and lean protein foods contain the nutrients you need without too many calories. Decrease your intake of foods high in solid fats, added sugars, and salt. Eat the right amount of calories for you.Get information about a proper diet from your caregiver, if necessary.  Regular physical exercise is one of the most important things you can do for your health. Most adults should get at least 150 minutes of moderate-intensity exercise (any activity that increases your heart rate and causes you to sweat) each week. In addition, most adults need muscle-strengthening exercises on 2 or more days a week.  Maintain a healthy weight. The body mass index (BMI) is a screening tool to identify possible weight problems. It provides an estimate of body fat based on height and weight. Your caregiver can help determine your BMI, and can help you achieve or maintain a healthy weight.For adults 20 years and older:  A BMI below 18.5 is considered underweight.  A BMI of 18.5 to 24.9 is normal.  A BMI of 25 to 29.9 is considered overweight.  A BMI of 30 and above is  considered obese.  Maintain normal blood lipids and cholesterol levels by exercising and minimizing your intake of saturated fat. Eat a balanced diet with plenty of fruit and vegetables. Blood tests for lipids and cholesterol should begin at age 20 and be repeated every 5 years. If your lipid or cholesterol levels are high, you are over 50, or you are a high risk for heart disease, you may need your cholesterol levels checked more frequently.Ongoing high lipid and cholesterol levels should be treated with medicines if diet and exercise are not effective.  If you smoke, find out from your caregiver how to quit. If you do not use tobacco, do not start.  If you choose to drink alcohol, do not exceed 2 drinks per day. One drink is considered to be 12 ounces (355 mL) of beer, 5 ounces (148 mL) of wine, or 1.5 ounces (44 mL) of liquor.  Avoid use of street drugs. Do not share needles with anyone. Ask for help if you need support or instructions about stopping the use of drugs.  High blood pressure causes heart disease and increases the risk of stroke. Your blood pressure should be checked at least every 1 to 2 years. Ongoing high blood pressure should be treated with medicines, if weight loss and exercise are not effective.  If you are 45 to 73 years old, ask your caregiver if you should take aspirin to prevent heart disease.  Diabetes screening involves taking a blood sample to check your fasting blood sugar level. This should be done once every 3 years,   after age 45, if you are within normal weight and without risk factors for diabetes. Testing should be considered at a younger age or be carried out more frequently if you are overweight and have at least 1 risk factor for diabetes.  Colorectal cancer can be detected and often prevented. Most routine colorectal cancer screening begins at the age of 50 and continues through age 75. However, your caregiver may recommend screening at an earlier age if you  have risk factors for colon cancer. On a yearly basis, your caregiver may provide home test kits to check for hidden blood in the stool. Use of a small camera at the end of a tube, to directly examine the colon (sigmoidoscopy or colonoscopy), can detect the earliest forms of colorectal cancer. Talk to your caregiver about this at age 50, when routine screening begins. Direct examination of the colon should be repeated every 5 to 10 years through age 75, unless early forms of pre-cancerous polyps or small growths are found.  Hepatitis C blood testing is recommended for all people born from 1945 through 1965 and any individual with known risks for hepatitis C.  Practice safe sex. Use condoms and avoid high-risk sexual practices to reduce the spread of sexually transmitted infections (STIs). STIs include gonorrhea, chlamydia, syphilis, trichomonas, herpes, HPV, and human immunodeficiency virus (HIV). Herpes, HIV, and HPV are viral illnesses that have no cure. They can result in disability, cancer, and death.  A one-time screening for abdominal aortic aneurysm (AAA) and surgical repair of large AAAs by sound wave imaging (ultrasonography) is recommended for ages 65 to 75 years who are current or former smokers.  Healthy men should no longer receive prostate-specific antigen (PSA) blood tests as part of routine cancer screening. Consult with your caregiver about prostate cancer screening.  Testicular cancer screening is not recommended for adult males who have no symptoms. Screening includes self-exam, caregiver exam, and other screening tests. Consult with your caregiver about any symptoms you have or any concerns you have about testicular cancer.  Use sunscreen with skin protection factor (SPF) of 30 or more. Apply sunscreen liberally and repeatedly throughout the day. You should seek shade when your shadow is shorter than you. Protect yourself by wearing long sleeves, pants, a wide-brimmed hat, and  sunglasses year round, whenever you are outdoors.  Once a month, do a whole body skin exam, using a mirror to look at the skin on your back. Notify your caregiver of new moles, moles that have irregular borders, moles that are larger than a pencil eraser, or moles that have changed in shape or color.  Stay current with required immunizations.  Influenza. You need a dose every fall (or winter). The composition of the flu vaccine changes each year, so being vaccinated once is not enough.  Pneumococcal polysaccharide. You need 1 to 2 doses if you smoke cigarettes or if you have certain chronic medical conditions. You need 1 dose at age 65 (or older) if you have never been vaccinated.  Tetanus, diphtheria, pertussis (Tdap, Td). Get 1 dose of Tdap vaccine if you are younger than age 65 years, are over 65 and have contact with an infant, are a healthcare worker, or simply want to be protected from whooping cough. After that, you need a Td booster dose every 10 years. Consult your caregiver if you have not had at least 3 tetanus and diphtheria-containing shots sometime in your life or have a deep or dirty wound.  HPV. This vaccine is recommended   for males 13 through 73 years of age. This vaccine may be given to men 22 through 73 years of age who have not completed the 3 dose series. It is recommended for men through age 26 who have sex with men or whose immune system is weakened because of HIV infection, other illness, or medications. The vaccine is given in 3 doses over 6 months.  Measles, mumps, rubella (MMR). You need at least 1 dose of MMR if you were born in 1957 or later. You may also need a 2nd dose.  Meningococcal. If you are age 19 to 21 years and a first-year college student living in a residence hall, or have one of several medical conditions, you need to get vaccinated against meningococcal disease. You may also need additional booster doses.  Zoster (shingles). If you are age 60 years or  older, you should get this vaccine.  Varicella (chickenpox). If you have never had chickenpox or you were vaccinated but received only 1 dose, talk to your caregiver to find out if you need this vaccine.  Hepatitis A. You need this vaccine if you have a specific risk factor for hepatitis A virus infection, or you simply wish to be protected from this disease. The vaccine is usually given as 2 doses, 6 to 18 months apart.  Hepatitis B. You need this vaccine if you have a specific risk factor for hepatitis B virus infection or you simply wish to be protected from this disease. The vaccine is given in 3 doses, usually over 6 months. Preventative Service / Frequency Ages 19 to 39  Blood pressure check.** / Every 1 to 2 years.  Lipid and cholesterol check.** / Every 5 years beginning at age 20.  Hepatitis C blood test.** / For any individual with known risks for hepatitis C.  Skin self-exam. / Monthly.  Influenza immunization.** / Every year.  Pneumococcal polysaccharide immunization.** / 1 to 2 doses if you smoke cigarettes or if you have certain chronic medical conditions.  Tetanus, diphtheria, pertussis (Tdap,Td) immunization. / A one-time dose of Tdap vaccine. After that, you need a Td booster dose every 10 years.  HPV immunization. / 3 doses over 6 months, if 26 and younger.  Measles, mumps, rubella (MMR) immunization. / You need at least 1 dose of MMR if you were born in 1957 or later. You may also need a 2nd dose.  Meningococcal immunization. / 1 dose if you are age 19 to 21 years and a first-year college student living in a residence hall, or have one of several medical conditions, you need to get vaccinated against meningococcal disease. You may also need additional booster doses.  Varicella immunization.** / Consult your caregiver.  Hepatitis A immunization.** / Consult your caregiver. 2 doses, 6 to 18 months apart.  Hepatitis B immunization.** / Consult your caregiver. 3 doses  usually over 6 months. Ages 40 to 64  Blood pressure check.** / Every 1 to 2 years.  Lipid and cholesterol check.** / Every 5 years beginning at age 20.  Fecal occult blood test (FOBT) of stool. / Every year beginning at age 50 and continuing until age 75. You may not have to do this test if you get colonoscopy every 10 years.  Flexible sigmoidoscopy** or colonoscopy.** / Every 5 years for a flexible sigmoidoscopy or every 10 years for a colonoscopy beginning at age 50 and continuing until age 75.  Hepatitis C blood test.** / For all people born from 1945 through 1965 and any   individual with known risks for hepatitis C.  Skin self-exam. / Monthly.  Influenza immunization.** / Every year.  Pneumococcal polysaccharide immunization.** / 1 to 2 doses if you smoke cigarettes or if you have certain chronic medical conditions.  Tetanus, diphtheria, pertussis (Tdap/Td) immunization.** / A one-time dose of Tdap vaccine. After that, you need a Td booster dose every 10 years.  Measles, mumps, rubella (MMR) immunization. / You need at least 1 dose of MMR if you were born in 1957 or later. You may also need a 2nd dose.  Varicella immunization.**/ Consult your caregiver.  Meningococcal immunization.** / Consult your caregiver.  Hepatitis A immunization.** / Consult your caregiver. 2 doses, 6 to 18 months apart.  Hepatitis B immunization.** / Consult your caregiver. 3 doses, usually over 6 months. Ages 65 and over  Blood pressure check.** / Every 1 to 2 years.  Lipid and cholesterol check.**/ Every 5 years beginning at age 20.  Fecal occult blood test (FOBT) of stool. / Every year beginning at age 50 and continuing until age 75. You may not have to do this test if you get colonoscopy every 10 years.  Flexible sigmoidoscopy** or colonoscopy.** / Every 5 years for a flexible sigmoidoscopy or every 10 years for a colonoscopy beginning at age 50 and continuing until age 75.  Hepatitis C blood  test.** / For all people born from 1945 through 1965 and any individual with known risks for hepatitis C.  Abdominal aortic aneurysm (AAA) screening.** / A one-time screening for ages 65 to 75 years who are current or former smokers.  Skin self-exam. / Monthly.  Influenza immunization.** / Every year.  Pneumococcal polysaccharide immunization.** / 1 dose at age 65 (or older) if you have never been vaccinated.  Tetanus, diphtheria, pertussis (Tdap, Td) immunization. / A one-time dose of Tdap vaccine if you are over 65 and have contact with an infant, are a healthcare worker, or simply want to be protected from whooping cough. After that, you need a Td booster dose every 10 years.  Varicella immunization. ** / Consult your caregiver.  Meningococcal immunization.** / Consult your caregiver.  Hepatitis A immunization. ** / Consult your caregiver. 2 doses, 6 to 18 months apart.  Hepatitis B immunization.** / Check with your caregiver. 3 doses, usually over 6 months. **Family history and personal history of risk and conditions may change your caregiver's recommendations. Document Released: 12/04/2001 Document Revised: 12/31/2011 Document Reviewed: 03/05/2011 Exit[employer] Patient Information 2013 [employer].  

## 2012-08-22 NOTE — Progress Notes (Signed)
Subjective:    Devin Schmidt is a 71 y.o. male who presents for Medicare Annual/Subsequent preventive examination.   Preventive Screening-Counseling & Management  Tobacco History  Smoking status  . Former Smoker -- 1.0 packs/day for 50 years  . Types: Cigarettes  . Quit date: 08/14/2009  Smokeless tobacco  . Never Used    Problems Prior to Visit 1.   Current Problems (verified) Patient Active Problem List  Diagnosis  . HEMOCHROMATOSIS  . URI  . SEBORRHEIC KERATOSIS, INFLAMED  . COUGH  . HEMOCHROMATOSIS  . LYMPHOPENIA  . Tremor, essential    Medications Prior to Visit No current outpatient prescriptions on file prior to visit.    Current Medications (verified) No current outpatient prescriptions on file.     Allergies (verified) Review of patient's allergies indicates no known allergies.   PAST HISTORY  Family History History reviewed. No pertinent family history.  Social History History  Substance Use Topics  . Smoking status: Former Smoker -- 1.0 packs/day for 50 years    Types: Cigarettes    Quit date: 08/14/2009  . Smokeless tobacco: Never Used  . Alcohol Use: 1.0 oz/week    2 drink(s) per week     rare    Are there smokers in your home (other than you)?  No  Risk Factors Current exercise habits: Gym/ health club routine includes cardio and swimming.  Dietary issues discussed: na   Cardiac risk factors: advanced age (older than 51 for men, 25 for women) and male gender.  Depression Screen (Note: if answer to either of the following is "Yes", a more complete depression screening is indicated)   Q1: Over the past two weeks, have you felt down, depressed or hopeless? No  Q2: Over the past two weeks, have you felt little interest or pleasure in doing things? No  Have you lost interest or pleasure in daily life? No  Do you often feel hopeless? No  Do you cry easily over simple problems? No  Activities of Daily Living In your present state  of health, do you have any difficulty performing the following activities?:  Driving? No Managing money?  No Feeding yourself? No Getting from bed to chair? No Climbing a flight of stairs? No Preparing food and eating?: No Bathing or showering? No Getting dressed: No Getting to the toilet? No Using the toilet:No Moving around from place to place: No In the past year have you fallen or had a near fall?:No   Are you sexually active?  Yes  Do you have more than one partner?  No  Hearing Difficulties: No Do you often ask people to speak up or repeat themselves? No Do you experience ringing or noises in your ears? No Do you have difficulty understanding soft or whispered voices? No   Do you feel that you have a problem with memory? No  Do you often misplace items? No  Do you feel safe at home?  Yes  Cognitive Testing  Alert? Yes  Normal Appearance?Yes  Oriented to person? Yes  Place? Yes   Time? Yes  Recall of three objects?  Yes  Can perform simple calculations? Yes  Displays appropriate judgment?Yes  Can read the correct time from a watch face?Yes   Advanced Directives have been discussed with the patient? Yes   List the Names of Other Physician/Practitioners you currently use: 1.  none  Indicate any recent Medical Services you may have received from other than [facility] providers in the past year (date  may be approximate).  There is no immunization history for the selected administration types on file for this patient.  Screening Tests Health Maintenance  Topic Date Due  . Zostavax  11/19/1998  . Influenza Vaccine  06/22/2012  . Colonoscopy  10/20/2013  . Tetanus/tdap  08/14/2021  . Pneumococcal Polysaccharide Vaccine Age 21 And Over  Addressed    All answers were reviewed with the patient and necessary referrals were made:  Loreen Freud, DO   08/22/2012   History reviewed:  He  has a past medical history of URI (upper respiratory infection); Inflamed seborrheic  keratosis; and Hemochromatosis. He  does not have any pertinent problems on file. He  has past surgical history that includes Appendectomy; Inguinal hernia repair; and Tonsillectomy. His family history is not on file. He  reports that he quit smoking about 3 years ago. His smoking use included Cigarettes. He has a 50 pack-year smoking history. He has never used smokeless tobacco. He reports that he drinks about one ounce of alcohol per week. He reports that he does not use illicit drugs. He currently has no medications in their medication list. No current outpatient prescriptions on file prior to visit.   He  has no known allergies.  Review of Systems  Review of Systems  Constitutional: Negative for activity change, appetite change and fatigue.  HENT: Negative for hearing loss, congestion, tinnitus and ear discharge.   Eyes: Negative for visual disturbance (see optho --no) Respiratory: Negative for cough, chest tightness and shortness of breath.   Cardiovascular: Negative for chest pain, palpitations and leg swelling.  Gastrointestinal: Negative for abdominal pain, diarrhea, constipation and abdominal distention.  Genitourinary: Negative for urgency, frequency, decreased urine volume and difficulty urinating.  Musculoskeletal: Negative for back pain, arthralgias and gait problem.  Skin: Negative for color change, pallor and rash.  Neurological: Negative for dizziness, light-headedness, numbness and headaches.  Hematological: Negative for adenopathy. Does not bruise/bleed easily.  Psychiatric/Behavioral: Negative for suicidal ideas, confusion, sleep disturbance, self-injury, dysphoric mood, decreased concentration and agitation.  Pt is able to read and write and can do all ADLs No risk for falling No abuse/ violence in home      Objective:     Vision by Snellen chart: right eye:20/30, left eye:20/30 Blood pressure 116/74, pulse 72, temperature 98.2 F (36.8 C), temperature source  Oral, height 6' (1.829 m), weight 177 lb 3.2 oz (80.377 kg), SpO2 98.00%. Body mass index is 24.03 kg/(m^2).   BP 116/74  Pulse 72  Temp 98.2 F (36.8 C) (Oral)  Ht 6' (1.829 m)  Wt 177 lb 3.2 oz (80.377 kg)  BMI 24.03 kg/m2  SpO2 98%  General Appearance:    Alert, cooperative, no distress, appears stated age  Head:    Normocephalic, without obvious abnormality, atraumatic  Eyes:    PERRL, conjunctiva/corneas clear, EOM's intact, fundi    benign, both eyes       Ears:    Normal TM's and external ear canals, both ears  Nose:   Nares normal, septum midline, mucosa normal, no drainage    or sinus tenderness  Throat:   Lips, mucosa, and tongue normal; teeth and gums normal  Neck:   Supple, symmetrical, trachea midline, no adenopathy;       thyroid:  No enlargement/tenderness/nodules; no carotid   bruit or JVD  Back:     Symmetric, no curvature, ROM normal, no CVA tenderness  Lungs:     Clear to auscultation bilaterally, respirations unlabored  Chest wall:  No tenderness or deformity  Heart:    Regular rate and rhythm, S1 and S2 normal, no murmur, rub   or gallop  Abdomen:     Soft, non-tender, bowel sounds active all four quadrants,    no masses, no organomegaly  Genitalia:    Normal male without lesion, discharge or tenderness  Rectal:    Normal tone, normal prostate, no masses or tenderness;   guaiac negative stool  Extremities:   Extremities normal, atraumatic, no cyanosis or edema  Pulses:   2+ and symmetric all extremities  Skin:   Skin color, texture, turgor normal, no rashes or lesions  Lymph nodes:   Cervical, supraclavicular, and axillary nodes normal  Neurologic:   CNII-XII intact. Normal strength, sensation and reflexes      throughout       Assessment:    cpe     Plan:     During the course of the visit the patient was educated and counseled about appropriate screening and preventive services including:    Pneumococcal vaccine   Influenza vaccine  Td  vaccine  Prostate cancer screening  Colorectal cancer screening  Advanced directives: has NO advanced directive  - add't info requested. Referral to SW: pt will call  Diet review for nutrition referral? Yes ____  Not Indicated ___x_   Patient Instructions (the written plan) was given to the patient.  Medicare Attestation I have personally reviewed: The patient's medical and social history Their use of alcohol, tobacco or illicit drugs Their current medications and supplements The patient's functional ability including ADLs,fall risks, home safety risks, cognitive, and hearing and visual impairment Diet and physical activities Evidence for depression or mood disorders  The patient's weight, height, BMI, and visual acuity have been recorded in the chart.  I have made referrals, counseling, and provided education to the patient based on review of the above and I have provided the patient with a written personalized care plan for preventive services.     Loreen Freud, DO   08/22/2012

## 2012-09-26 ENCOUNTER — Other Ambulatory Visit (INDEPENDENT_AMBULATORY_CARE_PROVIDER_SITE_OTHER): Payer: Medicare Other

## 2012-09-26 LAB — HEMATOCRIT: HCT: 47 % (ref 39.0–52.0)

## 2012-09-28 NOTE — Progress Notes (Signed)
Quick Note:  Continue every 2 month phlebotomy ______ 

## 2012-11-11 ENCOUNTER — Telehealth: Payer: Self-pay | Admitting: Family Medicine

## 2012-11-11 DIAGNOSIS — R251 Tremor, unspecified: Secondary | ICD-10-CM

## 2012-11-11 NOTE — Telephone Encounter (Signed)
Pt would like referral for Dr.Tat an [facility] [place] for movement disorders

## 2012-11-11 NOTE — Telephone Encounter (Signed)
Please advise, He had an apt back in 2012 for Tremors       KP

## 2012-11-11 NOTE — Telephone Encounter (Signed)
Ok to put referral in 

## 2012-11-20 ENCOUNTER — Ambulatory Visit (INDEPENDENT_AMBULATORY_CARE_PROVIDER_SITE_OTHER): Payer: Medicare Other | Admitting: Neurology

## 2012-11-20 ENCOUNTER — Encounter: Payer: Self-pay | Admitting: Neurology

## 2012-11-20 ENCOUNTER — Other Ambulatory Visit: Payer: Self-pay | Admitting: Neurology

## 2012-11-20 VITALS — BP 110/76 | HR 62 | Temp 97.8°F | Resp 12 | Ht 72.0 in | Wt 183.0 lb

## 2012-11-20 DIAGNOSIS — G252 Other specified forms of tremor: Secondary | ICD-10-CM

## 2012-11-20 DIAGNOSIS — G609 Hereditary and idiopathic neuropathy, unspecified: Secondary | ICD-10-CM | POA: Insufficient documentation

## 2012-11-20 DIAGNOSIS — G250 Essential tremor: Secondary | ICD-10-CM

## 2012-11-20 LAB — COMPREHENSIVE METABOLIC PANEL
ALT: 15 U/L (ref 0–53)
AST: 16 U/L (ref 0–37)
Alkaline Phosphatase: 68 U/L (ref 39–117)
BUN: 16 mg/dL (ref 6–23)
Calcium: 9.1 mg/dL (ref 8.4–10.5)
Chloride: 103 mEq/L (ref 96–112)
Creatinine, Ser: 1.2 mg/dL (ref 0.4–1.5)
Potassium: 4.4 mEq/L (ref 3.5–5.1)

## 2012-11-20 LAB — TSH: TSH: 0.6 u[IU]/mL (ref 0.35–5.50)

## 2012-11-20 LAB — VITAMIN B12: Vitamin B-12: 1020 pg/mL — ABNORMAL HIGH (ref 211–911)

## 2012-11-20 MED ORDER — PRIMIDONE 50 MG PO TABS: 50.0000 mg | ORAL_TABLET | Freq: Every day | ORAL | Status: DC

## 2012-11-20 NOTE — Progress Notes (Signed)
Subjective:   Devin Schmidt was seen in consultation in the movement disorder clinic at the request of Loreen Freud, DO.  The evaluation is for tremor.  The patient is a 72 y.o. right handed male with a history of tremor.Onset of symptoms was gradual, starting about 2 years ago. Tremor primarily involves the bilateral hand.  The tremor was symmetric but he started taking B12 about a year and a half ago and it helped especially on the R side.   Therefore, the L is worse now.  He notices it most when the hand is being brought to the mouth.  He notices it when eating peas or soup.  He does not need to wear a bib.  Alcohol helps the tremor.  Caffeine worsens it.  Social situations and anxiety increase tremor.  He notices it on the golf course when putting the ball on the tea.  There is no fam hx of tremor.  He tried xanax about 2 years ago and took it for 10 days without relief.  It only made him sleep.  Current/Previously tried tremor medications: xanax, fatigue and no help.  Current medications that may exacerbate tremor:  n/a   No Known Allergies  Current Outpatient Prescriptions on File Prior to Visit  Medication Sig Dispense Refill  . primidone (MYSOLINE) 50 MG tablet Take 1 tablet (50 mg total) by mouth at bedtime.  30 tablet  4    Past Medical History  Diagnosis Date  . URI (upper respiratory infection)   . Inflamed seborrheic keratosis   . Hemochromatosis     HX OF    Past Surgical History  Procedure Date  . Appendectomy   . Inguinal hernia repair   . Tonsillectomy     History   Social History  . Marital Status: Single    Spouse Name: N/A    Number of Children: N/A  . Years of Education: N/A   Occupational History  . RETIRED    Social History Main Topics  . Smoking status: Former Smoker -- 1.0 packs/day for 50 years    Types: Cigarettes    Quit date: 08/14/2009  . Smokeless tobacco: Never Used  . Alcohol Use: 1.0 oz/week    2 drink(s) per week     Comment: rare  (one time per month)  . Drug Use: No  . Sexually Active: Not Currently   Other Topics Concern  . Not on file   Social History Narrative   Exercise-- , except golf, swims     Family Status  Relation Status Death Age  . Mother Deceased 58    old age  . Father Deceased 22    MVA  . Brother Alive     detached retina  . Child Alive     healthy    Review of Systems A complete 10 system ROS was obtained and was negative apart from what is mentioned.   Objective:   VITALS:   Filed Vitals:   11/20/12 1014  BP: 110/76  Pulse: 62  Temp: 97.8 F (36.6 C)  Resp: 12  Height: 6' (1.829 m)  Weight: 183 lb (83.008 kg)   Gen:  Appears stated age and in NAD. HEENT:  Normocephalic, atraumatic. The mucous membranes are moist. The superficial temporal arteries are without ropiness or tenderness. Cardiovascular: Regular rate and rhythm. Lungs: Clear to auscultation bilaterally. Neck: There are no carotid bruits noted bilaterally.  NEUROLOGICAL:  Orientation:  The patient is alert and oriented x 3.  Recent and remote memory are intact.  Attention span and concentration are normal.  Able to name objects and repeat without trouble.  Fund of knowledge is appropriate Cranial nerves: There is good facial symmetry. The pupils are equal round and reactive to light bilaterally. Fundoscopic exam reveals clear disc margins bilaterally. Extraocular muscles are intact and visual fields are full to confrontational testing. Speech is fluent and clear. Soft palate rises symmetrically and there is no tongue deviation. Hearing is intact to conversational tone. Tone: Tone is good throughout. Sensation: Sensation is intact to light touch and pinprick throughout (facial, trunk, extremities). Vibration is decreased in a distal fashion. There is no extinction with double simultaneous stimulation. There is no sensory dermatomal level identified. Coordination:  The patient has no dysdiadichokinesia or  dysmetria. Motor: Strength is 5/5 in the bilateral upper and lower extremities.  Shoulder shrug is equal bilaterally.  There is no pronator drift.  There are no fasciculations noted. DTR's: Deep tendon reflexes are 1/4 at the bilateral biceps, triceps, brachioradialis, patella and achilles.  Plantar responses are downgoing bilaterally. Gait and Station: The patient is able to ambulate without difficulty. The patient is able to heel toe walk without any difficulty. The patient is able to ambulate in a tandem fashion. The patient is able to stand in the Romberg position.   MOVEMENT EXAM: Tremor:  There is mild tremor in the UE, noted most significantly with action.  The patient is  able to draw Archimedes spirals with minimal difficulty on the L.  There is no tremor at rest.  The patient has trouble pouring water from one full glass to another.  Tremor is most obvious with this action.  LABS:  Lab Results  Component Value Date   WBC 7.2 08/15/2011   HGB 15.7 09/26/2012   HCT 47.0 09/26/2012   MCV 101.3* 08/15/2011   PLT 171.0 08/15/2011   Lab Results  Component Value Date   TSH 1.65 07/18/2009   No results found for this basename: VITAMINB12     Chemistry      Component Value Date/Time   NA 142 08/15/2011 1110   K 4.5 08/15/2011 1110   CL 105 08/15/2011 1110   CO2 31 08/15/2011 1110   BUN 17 08/15/2011 1110   CREATININE 1.2 08/15/2011 1110      Component Value Date/Time   CALCIUM 9.4 08/15/2011 1110   ALKPHOS 70 08/15/2011 1110   AST 22 08/15/2011 1110   ALT 15 08/15/2011 1110   BILITOT 1.2 08/15/2011 1110          Assessment:   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  The patient has been stable with qoweek blood letting. 3.  PE evidence of PN.   Plan:    -Basic Metabolic Profile. -Thyroid studies. -Liver function panel. -spep/upep with immunofix, B12, folate, rpr, vit D -Primidone  (see orders).   Risks, benefits, side effects and  alternative therapies were discussed.  The opportunity to ask questions was given and they were answered to the best of my ability.  The patient expressed understanding and willingness to follow the outlined treatment protocols. -Pt education was provided -follow up 3-4 months. -

## 2012-11-20 NOTE — Addendum Note (Signed)
Addended by: Aubery Lapping on: 11/20/2012 11:40 AM   Modules accepted: Orders

## 2012-11-20 NOTE — Patient Instructions (Addendum)
1.  Start primidone (aka mysoline) - 50 mg - 1/2 tablet at night for 3 nights and then increase to 1 tablet nightly 2.  Get labs done

## 2012-11-21 LAB — RPR

## 2012-11-24 LAB — IMMUNOFIXATION ELECTROPHORESIS
IgA: 150 mg/dL (ref 68–379)
IgM, Serum: 286 mg/dL — ABNORMAL HIGH (ref 41–251)
Total Protein, Serum Electrophoresis: 6.9 g/dL (ref 6.0–8.3)

## 2012-11-26 ENCOUNTER — Other Ambulatory Visit (INDEPENDENT_AMBULATORY_CARE_PROVIDER_SITE_OTHER): Payer: Medicare Other

## 2012-11-27 ENCOUNTER — Other Ambulatory Visit: Payer: Self-pay

## 2012-11-27 NOTE — Progress Notes (Signed)
Quick Note:  Continue every 2 month phlebotomy Check ferritin next blood draw ______

## 2013-01-21 ENCOUNTER — Other Ambulatory Visit (INDEPENDENT_AMBULATORY_CARE_PROVIDER_SITE_OTHER): Payer: Medicare Other

## 2013-01-21 LAB — FERRITIN: Ferritin: 35.6 ng/mL (ref 22.0–322.0)

## 2013-01-23 NOTE — Progress Notes (Signed)
Quick Note:  Continue every 2 month phlebotomy please ______

## 2013-03-25 ENCOUNTER — Other Ambulatory Visit: Payer: Self-pay

## 2013-03-25 ENCOUNTER — Other Ambulatory Visit (INDEPENDENT_AMBULATORY_CARE_PROVIDER_SITE_OTHER): Payer: Medicare Other

## 2013-03-25 LAB — HEMATOCRIT: HCT: 47.9 % (ref 39.0–52.0)

## 2013-03-25 LAB — HEMOGLOBIN: Hemoglobin: 16.1 g/dL (ref 13.0–17.0)

## 2013-03-25 NOTE — Progress Notes (Signed)
Quick Note:  Continue every 3 month phlebotomy ______

## 2013-03-26 NOTE — Progress Notes (Signed)
Quick Note:  My mistake - continue every 2 months ______

## 2013-04-06 ENCOUNTER — Telehealth: Payer: Self-pay

## 2013-04-06 MED ORDER — PRIMIDONE 50 MG PO TABS: 50.0000 mg | ORAL_TABLET | Freq: Every day | ORAL | Status: DC

## 2013-04-06 NOTE — Telephone Encounter (Signed)
Pt requesting refill of primidone to [facility].

## 2013-04-06 NOTE — Telephone Encounter (Signed)
One month RX.  Needs a follow up in next month.

## 2013-04-06 NOTE — Telephone Encounter (Signed)
Unable to reach pt via phone.  Does something weird each time I try to call.  Sent in refill with message to the pharmacist that pt needs f/u.  Will try to call again later.

## 2013-04-16 ENCOUNTER — Ambulatory Visit (INDEPENDENT_AMBULATORY_CARE_PROVIDER_SITE_OTHER): Payer: Medicare Other | Admitting: Neurology

## 2013-04-16 ENCOUNTER — Encounter: Payer: Self-pay | Admitting: Neurology

## 2013-04-16 VITALS — BP 128/72 | HR 80 | Temp 98.1°F | Resp 16 | Wt 178.0 lb

## 2013-04-16 DIAGNOSIS — G609 Hereditary and idiopathic neuropathy, unspecified: Secondary | ICD-10-CM

## 2013-04-16 DIAGNOSIS — G252 Other specified forms of tremor: Secondary | ICD-10-CM

## 2013-04-16 DIAGNOSIS — G250 Essential tremor: Secondary | ICD-10-CM

## 2013-04-16 MED ORDER — PRIMIDONE 50 MG PO TABS: 50.0000 mg | ORAL_TABLET | Freq: Every day | ORAL | Status: DC

## 2013-04-16 NOTE — Progress Notes (Signed)
Subjective:   Devin Schmidt was seen in consultation in the movement disorder clinic at for followup regarding his essential tremor.  He was last seen in January.  Time, we started him on primidone.  The patient states that he is 80-85% better.  He is very pleased with the medication.  He denies any side effects.  He does state that he combines the primidone with a scotch, then he is 100% better.  However, he does not do that often and has not had a scotch in over a month.  He does continue to have blood letting every 2 months for his hemachromatosis.  He has been quite stable in that regard.  He did have a workup for peripheral neuropathy since last visit.  There was no monoclonal protein the serum.  B12 was 1020, folate greater than 24.8, RPR negative, vitamin D 31 and TSH 0.60.   Current/Previously tried tremor medications: xanax, fatigue and no help. Now on primidone, helpful  Current medications that may exacerbate tremor:  n/a   No Known Allergies  Current Outpatient Prescriptions on File Prior to Visit  Medication Sig Dispense Refill  . primidone (MYSOLINE) 50 MG tablet Take 1 tablet (50 mg total) by mouth at bedtime.  30 tablet  0   No current facility-administered medications on file prior to visit.    Past Medical History  Diagnosis Date  . URI (upper respiratory infection)   . Inflamed seborrheic keratosis   . Hemochromatosis     HX OF    Past Surgical History  Procedure Laterality Date  . Appendectomy    . Inguinal hernia repair    . Tonsillectomy      History   Social History  . Marital Status: Single    Spouse Name: N/A    Number of Children: N/A  . Years of Education: N/A   Occupational History  . RETIRED    Social History Main Topics  . Smoking status: Former Smoker -- 1.00 packs/day for 50 years    Types: Cigarettes    Quit date: 08/14/2009  . Smokeless tobacco: Never Used  . Alcohol Use: 1.0 oz/week    2 drink(s) per week     Comment: rare (one  time per month)  . Drug Use: No  . Sexually Active: Not Currently   Other Topics Concern  . Not on file   Social History Narrative   Exercise-- , except golf, swims     Family Status  Relation Status Death Age  . Mother Deceased 49    old age  . Father Deceased 33    MVA  . Brother Alive     detached retina  . Child Alive     healthy    Review of Systems A complete 10 system ROS was obtained and was negative apart from what is mentioned.   Objective:   VITALS:   Filed Vitals:   04/16/13 0901  BP: 128/72  Pulse: 80  Temp: 98.1 F (36.7 C)  Resp: 16  Weight: 178 lb (80.74 kg)   Gen:  Appears stated age and in NAD. HEENT:  Normocephalic, atraumatic. The mucous membranes are moist. The superficial temporal arteries are without ropiness or tenderness. Cardiovascular: Regular rate and rhythm. Lungs: Clear to auscultation bilaterally. Neck: There are no carotid bruits noted bilaterally.  NEUROLOGICAL:  Orientation:  The patient is alert and oriented x 3.  Recent and remote memory are intact.  Attention span and concentration are normal.  Able to  name objects and repeat without trouble.  Fund of knowledge is appropriate Cranial nerves: There is good facial symmetry. The pupils are equal round and reactive to light bilaterally. Fundoscopic exam reveals clear disc margins bilaterally. Extraocular muscles are intact and visual fields are full to confrontational testing. Speech is fluent and clear. Soft palate rises symmetrically and there is no tongue deviation. Hearing is intact to conversational tone. Tone: Tone is good throughout. Sensation: Sensation is intact to light touch and pinprick throughout (facial, trunk, extremities). Vibration is decreased in a distal fashion. There is no extinction with double simultaneous stimulation. There is no sensory dermatomal level identified. Coordination:  The patient has no dysdiadichokinesia or dysmetria. Motor: Strength is 5/5 in  the bilateral upper and lower extremities.  Shoulder shrug is equal bilaterally.  There is no pronator drift.  There are no fasciculations noted. DTR's: Deep tendon reflexes are 1/4 at the bilateral biceps, triceps, brachioradialis, patella and achilles.  Plantar responses are downgoing bilaterally. Gait and Station: The patient is able to ambulate without difficulty. The patient is able to heel toe walk without any difficulty. The patient is able to ambulate in a tandem fashion. The patient is able to stand in the Romberg position.   MOVEMENT EXAM: Tremor:  There is minimal tremor in the UE, noted most significantly with action.  The patients Archimedes spirals are greatly improved.  There is no tremor at rest.    LABS:  Lab Results  Component Value Date   WBC 7.2 08/15/2011   HGB 16.1 03/25/2013   HCT 47.9 03/25/2013   MCV 101.3* 08/15/2011   PLT 171.0 08/15/2011   Lab Results  Component Value Date   TSH 0.60 11/20/2012   Lab Results  Component Value Date   VITAMINB12 1020* 11/20/2012     Chemistry      Component Value Date/Time   NA 138 11/20/2012 1126   K 4.4 11/20/2012 1126   CL 103 11/20/2012 1126   CO2 29 11/20/2012 1126   BUN 16 11/20/2012 1126   CREATININE 1.2 11/20/2012 1126      Component Value Date/Time   CALCIUM 9.1 11/20/2012 1126   ALKPHOS 68 11/20/2012 1126   AST 16 11/20/2012 1126   ALT 15 11/20/2012 1126   BILITOT 0.7 11/20/2012 1126          Assessment:   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  The patient has been stable with qoweek blood letting. 3.  PE evidence of PN.   Plan:    -the patient is doing very well on primidone.  He will remain on this medication.  He requests a year prescription with yearly followups.  I have no objection to that, as his liver is being monitored elsewhere for his hemachromatosis.  He will let me know if you have any difficulties.  Risks, benefits, side effects and alternative therapies were  discussed.  The opportunity to ask questions was given and they were answered to the best of my ability.  The patient expressed understanding and willingness to follow the outlined treatment protocols. -His laboratory workup for peripheral neuropathy was unremarkable. -Followup in one year.

## 2013-04-16 NOTE — Patient Instructions (Addendum)
Follow-up in one year.

## 2013-05-27 ENCOUNTER — Other Ambulatory Visit (INDEPENDENT_AMBULATORY_CARE_PROVIDER_SITE_OTHER): Payer: Medicare Other

## 2013-05-27 LAB — HEMOGLOBIN: Hemoglobin: 15.5 g/dL (ref 13.0–17.0)

## 2013-06-01 NOTE — Progress Notes (Signed)
Quick Note:  Please repeat phlebotomy in 2 months as before but also check a ferritin ______

## 2013-06-02 ENCOUNTER — Other Ambulatory Visit: Payer: Self-pay

## 2013-07-29 ENCOUNTER — Other Ambulatory Visit (INDEPENDENT_AMBULATORY_CARE_PROVIDER_SITE_OTHER): Payer: Medicare Other

## 2013-07-29 LAB — FERRITIN: Ferritin: 28 ng/mL (ref 22.0–322.0)

## 2013-07-31 NOTE — Progress Notes (Signed)
Quick Note:  Labs ok Continue every other month phlebotomy Does not need ferritin level next time only Hgb ______

## 2013-08-31 ENCOUNTER — Encounter: Payer: Medicare Other | Admitting: Family Medicine

## 2013-09-08 ENCOUNTER — Telehealth: Payer: Self-pay

## 2013-09-08 NOTE — Telephone Encounter (Addendum)
Left message for call back identifiable Medication List and allergies: reviewed and updated  90 day supply/mail order: na Local prescriptions: [facility]  Immunizations due: declines flu vaccine  A/P:   No changes to FH or PSH PSA--08/2012--1.17 CCS--09/2008--adenomatous findings--next due 09/2013 Tdap--declined 07/2011 PNA--declined 07/2011  To Discuss with Provider: Not at this time

## 2013-09-09 ENCOUNTER — Ambulatory Visit (INDEPENDENT_AMBULATORY_CARE_PROVIDER_SITE_OTHER): Payer: Medicare Other | Admitting: Family Medicine

## 2013-09-09 ENCOUNTER — Encounter: Payer: Self-pay | Admitting: Family Medicine

## 2013-09-09 VITALS — BP 110/68 | HR 57 | Temp 97.6°F | Ht 72.0 in | Wt 179.8 lb

## 2013-09-09 DIAGNOSIS — R319 Hematuria, unspecified: Secondary | ICD-10-CM

## 2013-09-09 DIAGNOSIS — Z136 Encounter for screening for cardiovascular disorders: Secondary | ICD-10-CM

## 2013-09-09 DIAGNOSIS — Z0000 Encounter for general adult medical examination without abnormal findings: Secondary | ICD-10-CM

## 2013-09-09 DIAGNOSIS — R351 Nocturia: Secondary | ICD-10-CM

## 2013-09-09 DIAGNOSIS — Z0100 Encounter for examination of eyes and vision without abnormal findings: Secondary | ICD-10-CM

## 2013-09-09 LAB — CBC WITH DIFFERENTIAL/PLATELET
Basophils Absolute: 0 10*3/uL (ref 0.0–0.1)
Eosinophils Absolute: 0.2 10*3/uL (ref 0.0–0.7)
Hemoglobin: 16.3 g/dL (ref 13.0–17.0)
Lymphocytes Relative: 18 % (ref 12.0–46.0)
MCHC: 34.4 g/dL (ref 30.0–36.0)
Monocytes Relative: 8.4 % (ref 3.0–12.0)
Neutro Abs: 5.5 10*3/uL (ref 1.4–7.7)
Neutrophils Relative %: 70.3 % (ref 43.0–77.0)
RBC: 4.85 Mil/uL (ref 4.22–5.81)
RDW: 12.9 % (ref 11.5–14.6)

## 2013-09-09 LAB — HEPATIC FUNCTION PANEL
AST: 14 U/L (ref 0–37)
Albumin: 4 g/dL (ref 3.5–5.2)
Alkaline Phosphatase: 69 U/L (ref 39–117)
Bilirubin, Direct: 0.1 mg/dL (ref 0.0–0.3)

## 2013-09-09 LAB — LIPID PANEL
Cholesterol: 177 mg/dL (ref 0–200)
HDL: 53.6 mg/dL (ref >39.00)
Total CHOL/HDL Ratio: 3
Triglycerides: 110 mg/dL (ref 0.0–149.0)
VLDL: 22 mg/dL (ref 0.0–40.0)

## 2013-09-09 LAB — POCT URINALYSIS DIPSTICK
Bilirubin, UA: NEGATIVE
Glucose, UA: NEGATIVE
Ketones, UA: NEGATIVE
Nitrite, UA: NEGATIVE
Spec Grav, UA: >=1.03
Urobilinogen, UA: 0.2

## 2013-09-09 LAB — BASIC METABOLIC PANEL
Calcium: 9.3 mg/dL (ref 8.4–10.5)
Creatinine, Ser: 1.2 mg/dL (ref 0.4–1.5)
GFR: 64.63 mL/min (ref >60.00)
Glucose, Bld: 86 mg/dL (ref 70–99)
Sodium: 137 mEq/L (ref 135–145)

## 2013-09-09 LAB — PSA: PSA: 1.16 ng/mL (ref 0.10–4.00)

## 2013-09-09 NOTE — Progress Notes (Signed)
Subjective:    Devin Schmidt is a 72 y.o. male who presents for Medicare Annual/Subsequent preventive examination.   Preventive Screening-Counseling & Management  Tobacco History  Smoking status  . Former Smoker -- 1.00 packs/day for 50 years  . Types: Cigarettes  . Quit date: 08/14/2009  Smokeless tobacco  . Never Used    Problems Prior to Visit 1. none  Current Problems (verified) Patient Active Problem List   Diagnosis Date Noted  . Essential tremor 11/20/2012  . Unspecified hereditary and idiopathic peripheral neuropathy 11/20/2012  . Tremor, essential 04/30/2011  . LYMPHOPENIA 12/08/2010  . COUGH 07/18/2009  . URI 01/19/2008  . HEMOCHROMATOSIS 08/15/2007  . SEBORRHEIC KERATOSIS, INFLAMED 08/15/2007  . HEMOCHROMATOSIS 08/15/2007    Medications Prior to Visit Current Outpatient Prescriptions on File Prior to Visit  Medication Sig Dispense Refill  . primidone (MYSOLINE) 50 MG tablet Take 50 mg by mouth once.       No current facility-administered medications on file prior to visit.    Current Medications (verified) Current Outpatient Prescriptions  Medication Sig Dispense Refill  . primidone (MYSOLINE) 50 MG tablet Take 50 mg by mouth once.       No current facility-administered medications for this visit.     Allergies (verified) Review of patient's allergies indicates no known allergies.   PAST HISTORY  Family History History reviewed. No pertinent family history.  Social History History  Substance Use Topics  . Smoking status: Former Smoker -- 1.00 packs/day for 50 years    Types: Cigarettes    Quit date: 08/14/2009  . Smokeless tobacco: Never Used  . Alcohol Use: 1.0 oz/week    2 drink(s) per week     Comment: rare (one time per month)    Are there smokers in your home (other than you)?  No  Risk Factors Current exercise habits: walk q am and golf 2x a week  Dietary issues discussed: na   Cardiac risk factors: advanced age  (older than 63 for men, 23 for women) and male gender.  Depression Screen (Note: if answer to either of the following is "Yes", a more complete depression screening is indicated)   Q1: Over the past two weeks, have you felt down, depressed or hopeless? No  Q2: Over the past two weeks, have you felt little interest or pleasure in doing things? No  Have you lost interest or pleasure in daily life? No  Do you often feel hopeless? No  Do you cry easily over simple problems? No  Activities of Daily Living In your present state of health, do you have any difficulty performing the following activities?:  Driving? No Managing money?  No Feeding yourself? No Getting from bed to chair? No Climbing a flight of stairs? No Preparing food and eating?: No Bathing or showering? No Getting dressed: No Getting to the toilet? No Using the toilet:No Moving around from place to place: No In the past year have you fallen or had a near fall?:No   Are you sexually active?  No  Do you have more than one partner?  No  Hearing Difficulties: No Do you often ask people to speak up or repeat themselves? No Do you experience ringing or noises in your ears? No Do you have difficulty understanding soft or whispered voices? No   Do you feel that you have a problem with memory? No  Do you often misplace items? No  Do you feel safe at home?  No  Cognitive Testing  Alert? Yes  Normal Appearance?Yes  Oriented to person? Yes  Place? Yes   Time? Yes  Recall of three objects?  Yes  Can perform simple calculations? Yes  Displays appropriate judgment?Yes  Can read the correct time from a watch face?Yes   Advanced Directives have been discussed with the patient? Yes   List the Names of Other Physician/Practitioners you currently use: 1.  Dentist- [facility] works 2 neuro--Dr Tat 3.  GI-- [facility]  Indicate any recent Medical Services you may have received from other than [facility] providers in the past year (date  may be approximate).  There is no immunization history for the selected administration types on file for this patient.  Screening Tests Health Maintenance  Topic Date Due  . Influenza Vaccine  09/09/2014  . Pneumococcal Polysaccharide Vaccine Age 67 And Over  09/09/2014  . Zostavax  09/09/2014  . Colonoscopy  10/20/2013  . Tetanus/tdap  08/14/2021    All answers were reviewed with the patient and necessary referrals were made:  Loreen Freud, DO   09/09/2013   History reviewed:  He  has a past medical history of URI (upper respiratory infection); Inflamed seborrheic keratosis; and Hemochromatosis. He  does not have any pertinent problems on file. He  has past surgical history that includes Appendectomy; Inguinal hernia repair; and Tonsillectomy. His family history is not on file. He  reports that he quit smoking about 4 years ago. His smoking use included Cigarettes. He has a 50 pack-year smoking history. He has never used smokeless tobacco. He reports that he drinks about 1.0 ounces of alcohol per week. He reports that he does not use illicit drugs. He has a current medication list which includes the following prescription(s): primidone. Current Outpatient Prescriptions on File Prior to Visit  Medication Sig Dispense Refill  . primidone (MYSOLINE) 50 MG tablet Take 50 mg by mouth once.       No current facility-administered medications on file prior to visit.   He has No Known Allergies.  Review of Systems  Review of Systems  Constitutional: Negative for activity change, appetite change and fatigue.  HENT: Negative for hearing loss, congestion, tinnitus and ear discharge.   Eyes: Negative for visual disturbance (see optho --due)  Respiratory: Negative for cough, chest tightness and shortness of breath.   Cardiovascular: Negative for chest pain, palpitations and leg swelling.  Gastrointestinal: Negative for abdominal pain, diarrhea, constipation and abdominal distention.   Genitourinary: Negative for urgency, frequency, decreased urine volume and difficulty urinating.  Musculoskeletal: Negative for back pain, arthralgias and gait problem.  Skin: Negative for color change, pallor and rash.  Neurological: Negative for dizziness, light-headedness, numbness and headaches.  Hematological: Negative for adenopathy. Does not bruise/bleed easily.  Psychiatric/Behavioral: Negative for suicidal ideas, confusion, sleep disturbance, self-injury, dysphoric mood, decreased concentration and agitation.  Pt is able to read and write and can do all ADLs No risk for falling No abuse/ violence in home     Objective:     Vision by Snellen chart: right eye:20/30, left eye:20/30 Blood pressure 110/68, pulse 57, temperature 97.6 F (36.4 C), temperature source Oral, height 6' (1.829 m), weight 179 lb 12.8 oz (81.557 kg), SpO2 98.00%. Body mass index is 24.38 kg/(m^2).  BP 110/68  Pulse 57  Temp(Src) 97.6 F (36.4 C) (Oral)  Ht 6' (1.829 m)  Wt 179 lb 12.8 oz (81.557 kg)  BMI 24.38 kg/m2  SpO2 98%  General Appearance:    Alert, cooperative, no distress, appears  stated age  Head:    Normocephalic, without obvious abnormality, atraumatic  Eyes:    PERRL, conjunctiva/corneas clear, EOM's intact, fundi    benign, both eyes       Ears:    Normal TM's and external ear canals, both ears  Nose:   Nares normal, septum midline, mucosa normal, no drainage    or sinus tenderness  Throat:   Lips, mucosa, and tongue normal; teeth and gums normal  Neck:   Supple, symmetrical, trachea midline, no adenopathy;       thyroid:  No enlargement/tenderness/nodules; no carotid   bruit or JVD  Back:     Symmetric, no curvature, ROM normal, no CVA tenderness  Lungs:     Clear to auscultation bilaterally, respirations unlabored  Chest wall:    No tenderness or deformity  Heart:    Regular rate and rhythm, S1 and S2 normal, no murmur, rub   or gallop  Abdomen:     Soft, non-tender, bowel  sounds active all four quadrants,    no masses, no organomegaly  Genitalia:    Normal male without lesion, discharge or tenderness  Rectal:    Normal tone, normal prostate, no masses or tenderness;   guaiac negative stool  Extremities:   Extremities normal, atraumatic, no cyanosis or edema  Pulses:   2+ and symmetric all extremities  Skin:   Skin color, texture, turgor normal, no rashes or lesions  Lymph nodes:   Cervical, supraclavicular, and axillary nodes normal  Neurologic:   CNII-XII intact. Normal strength, sensation and reflexes      throughout       Assessment:     cpe      Plan:     During the course of the visit the patient was educated and counseled about appropriate screening and preventive services including:    Prostate cancer screening  Colorectal cancer screening  Advanced directives: has NO advanced directive - not interested in additional information -     Pt declined all immunizations Diet review for nutrition referral? Yes ____  Not Indicated __x__   Patient Instructions (the written plan) was given to the patient.  Medicare Attestation I have personally reviewed: The patient's medical and social history Their use of alcohol, tobacco or illicit drugs Their current medications and supplements The patient's functional ability including ADLs,fall risks, home safety risks, cognitive, and hearing and visual impairment Diet and physical activities Evidence for depression or mood disorders  The patient's weight, height, BMI, and visual acuity have been recorded in the chart.  I have made referrals, counseling, and provided education to the patient based on review of the above and I have provided the patient with a written personalized care plan for preventive services.     Loreen Freud, DO   09/09/2013

## 2013-09-09 NOTE — Progress Notes (Signed)
Pre visit review using our clinic review tool, if applicable. No additional management support is needed unless otherwise documented below in the visit note. 

## 2013-09-09 NOTE — Patient Instructions (Signed)
 Preventive Care for Adults, Male A healthy lifestyle and preventive care can promote health and wellness. Preventive health guidelines for men include the following key practices:  A routine yearly physical is a good way to check with your caregiver about your health and preventative screening. It is a chance to share any concerns and updates on your health, and to receive a thorough exam.  Visit your dentist for a routine exam and preventative care every 6 months. Brush your teeth twice a day and floss once a day. Good oral hygiene prevents tooth decay and gum disease.  The frequency of eye exams is based on your age, health, family medical history, use of contact lenses, and other factors. Follow your caregiver's recommendations for frequency of eye exams.  Eat a healthy diet. Foods like vegetables, fruits, whole grains, low-fat dairy products, and lean protein foods contain the nutrients you need without too many calories. Decrease your intake of foods high in solid fats, added sugars, and salt. Eat the right amount of calories for you.Get information about a proper diet from your caregiver, if necessary.  Regular physical exercise is one of the most important things you can do for your health. Most adults should get at least 150 minutes of moderate-intensity exercise (any activity that increases your heart rate and causes you to sweat) each week. In addition, most adults need muscle-strengthening exercises on 2 or more days a week.  Maintain a healthy weight. The body mass index (BMI) is a screening tool to identify possible weight problems. It provides an estimate of body fat based on height and weight. Your caregiver can help determine your BMI, and can help you achieve or maintain a healthy weight.For adults 20 years and older:  A BMI below 18.5 is considered underweight.  A BMI of 18.5 to 24.9 is normal.  A BMI of 25 to 29.9 is considered overweight.  A BMI of 30 and above is  considered obese.  Maintain normal blood lipids and cholesterol levels by exercising and minimizing your intake of saturated fat. Eat a balanced diet with plenty of fruit and vegetables. Blood tests for lipids and cholesterol should begin at age 20 and be repeated every 5 years. If your lipid or cholesterol levels are high, you are over 50, or you are a high risk for heart disease, you may need your cholesterol levels checked more frequently.Ongoing high lipid and cholesterol levels should be treated with medicines if diet and exercise are not effective.  If you smoke, find out from your caregiver how to quit. If you do not use tobacco, do not start.  Lung cancer screening is recommended for adults aged 55 80 years who are at high risk for developing lung cancer because of a history of smoking. Yearly low-dose computed tomography (CT) is recommended for people who have at least a 30-pack-year history of smoking and are a current smoker or have quit within the past 15 years. A pack year of smoking is smoking an average of 1 pack of cigarettes a day for 1 year (for example: 1 pack a day for 30 years or 2 packs a day for 15 years). Yearly screening should continue until the smoker has stopped smoking for at least 15 years. Yearly screening should also be stopped for people who develop a health problem that would prevent them from having lung cancer treatment.  If you choose to drink alcohol, do not exceed 2 drinks per day. One drink is considered to be 12   ounces (355 mL) of beer, 5 ounces (148 mL) of wine, or 1.5 ounces (44 mL) of liquor.  Avoid use of street drugs. Do not share needles with anyone. Ask for help if you need support or instructions about stopping the use of drugs.  High blood pressure causes heart disease and increases the risk of stroke. Your blood pressure should be checked at least every 1 to 2 years. Ongoing high blood pressure should be treated with medicines, if weight loss and  exercise are not effective.  If you are 45 to 74 years old, ask your caregiver if you should take aspirin to prevent heart disease.  Diabetes screening involves taking a blood sample to check your fasting blood sugar level. This should be done once every 3 years, after age 45, if you are within normal weight and without risk factors for diabetes. Testing should be considered at a younger age or be carried out more frequently if you are overweight and have at least 1 risk factor for diabetes.  Colorectal cancer can be detected and often prevented. Most routine colorectal cancer screening begins at the age of 50 and continues through age 75. However, your caregiver may recommend screening at an earlier age if you have risk factors for colon cancer. On a yearly basis, your caregiver may provide home test kits to check for hidden blood in the stool. Use of a small camera at the end of a tube, to directly examine the colon (sigmoidoscopy or colonoscopy), can detect the earliest forms of colorectal cancer. Talk to your caregiver about this at age 50, when routine screening begins. Direct examination of the colon should be repeated every 5 to 10 years through age 75, unless early forms of pre-cancerous polyps or small growths are found.  Hepatitis C blood testing is recommended for all people born from 1945 through 1965 and any individual with known risks for hepatitis C.  Practice safe sex. Use condoms and avoid high-risk sexual practices to reduce the spread of sexually transmitted infections (STIs). STIs include gonorrhea, chlamydia, syphilis, trichomonas, herpes, HPV, and human immunodeficiency virus (HIV). Herpes, HIV, and HPV are viral illnesses that have no cure. They can result in disability, cancer, and death.  A one-time screening for abdominal aortic aneurysm (AAA) and surgical repair of large AAAs by sound wave imaging (ultrasonography) is recommended for ages 65 to 75 years who are current or  former smokers.  Healthy men should no longer receive prostate-specific antigen (PSA) blood tests as part of routine cancer screening. Consult with your caregiver about prostate cancer screening.  Testicular cancer screening is not recommended for adult males who have no symptoms. Screening includes self-exam, caregiver exam, and other screening tests. Consult with your caregiver about any symptoms you have or any concerns you have about testicular cancer.  Use sunscreen. Apply sunscreen liberally and repeatedly throughout the day. You should seek shade when your shadow is shorter than you. Protect yourself by wearing long sleeves, pants, a wide-brimmed hat, and sunglasses year round, whenever you are outdoors.  Once a month, do a whole body skin exam, using a mirror to look at the skin on your back. Notify your caregiver of new moles, moles that have irregular borders, moles that are larger than a pencil eraser, or moles that have changed in shape or color.  Stay current with required immunizations.  Influenza vaccine. All adults should be immunized every year.  Tetanus, diphtheria, and acellular pertussis (Td, Tdap) vaccine. An adult who has not   previously received Tdap or who does not know his vaccine status should receive 1 dose of Tdap. This initial dose should be followed by tetanus and diphtheria toxoids (Td) booster doses every 10 years. Adults with an unknown or incomplete history of completing a 3-dose immunization series with Td-containing vaccines should begin or complete a primary immunization series including a Tdap dose. Adults should receive a Td booster every 10 years.  Varicella vaccine. An adult without evidence of immunity to varicella should receive 2 doses or a second dose if he has previously received 1 dose.  Human papillomavirus (HPV) vaccine. Males aged 13 21 years who have not received the vaccine previously should receive the 3-dose series. Males aged 22 26 years may be  immunized. Immunization is recommended through the age of 26 years for any male who has sex with males and did not get any or all doses earlier. Immunization is recommended for any person with an immunocompromised condition through the age of 26 years if he did not get any or all doses earlier. During the 3-dose series, the second dose should be obtained 4 8 weeks after the first dose. The third dose should be obtained 24 weeks after the first dose and 16 weeks after the second dose.  Zoster vaccine. One dose is recommended for adults aged 60 years or older unless certain conditions are present.  Measles, mumps, and rubella (MMR) vaccine. Adults born before 1957 generally are considered immune to measles and mumps. Adults born in 1957 or later should have 1 or more doses of MMR vaccine unless there is a contraindication to the vaccine or there is laboratory evidence of immunity to each of the three diseases. A routine second dose of MMR vaccine should be obtained at least 28 days after the first dose for students attending postsecondary schools, health care workers, or international travelers. People who received inactivated measles vaccine or an unknown type of measles vaccine during 1963 1967 should receive 2 doses of MMR vaccine. People who received inactivated mumps vaccine or an unknown type of mumps vaccine before 1979 and are at high risk for mumps infection should consider immunization with 2 doses of MMR vaccine. Unvaccinated health care workers born before 1957 who lack laboratory evidence of measles, mumps, or rubella immunity or laboratory confirmation of disease should consider measles and mumps immunization with 2 doses of MMR vaccine or rubella immunization with 1 dose of MMR vaccine.  Pneumococcal 13-valent conjugate (PCV13) vaccine. When indicated, a person who is uncertain of his immunization history and has no record of immunization should receive the PCV13 vaccine. An adult aged 19 years or  older who has certain medical conditions and has not been previously immunized should receive 1 dose of PCV13 vaccine. This PCV13 should be followed with a dose of pneumococcal polysaccharide (PPSV23) vaccine. The PPSV23 vaccine dose should be obtained at least 8 weeks after the dose of PCV13 vaccine. An adult aged 19 years or older who has certain medical conditions and previously received 1 or more doses of PPSV23 vaccine should receive 1 dose of PCV13. The PCV13 vaccine dose should be obtained 1 or more years after the last PPSV23 vaccine dose.  Pneumococcal polysaccharide (PPSV23) vaccine. When PCV13 is also indicated, PCV13 should be obtained first. All adults aged 65 years and older should be immunized. An adult younger than age 65 years who has certain medical conditions should be immunized. Any person who resides in a nursing home or long-term care facility should be   immunized. An adult smoker should be immunized. People with an immunocompromised condition and certain other conditions should receive both PCV13 and PPSV23 vaccines. People with human immunodeficiency virus (HIV) infection should be immunized as soon as possible after diagnosis. Immunization during chemotherapy or radiation therapy should be avoided. Routine use of PPSV23 vaccine is not recommended for American Indians, [place] Natives, or people younger than 65 years unless there are medical conditions that require PPSV23 vaccine. When indicated, people who have unknown immunization and have no record of immunization should receive PPSV23 vaccine. One-time revaccination 5 years after the first dose of PPSV23 is recommended for people aged 19 64 years who have chronic kidney failure, nephrotic syndrome, asplenia, or immunocompromised conditions. People who received 1 2 doses of PPSV23 before age 65 years should receive another dose of PPSV23 vaccine at age 65 years or later if at least 5 years have passed since the previous dose. Doses of  PPSV23 are not needed for people immunized with PPSV23 at or after age 65 years.  Meningococcal vaccine. Adults with asplenia or persistent complement component deficiencies should receive 2 doses of quadrivalent meningococcal conjugate (MenACWY-D) vaccine. The doses should be obtained at least 2 months apart. Microbiologists working with certain meningococcal bacteria, military recruits, people at risk during an outbreak, and people who travel to or live in countries with a high rate of meningitis should be immunized. A first-year college student up through age 21 years who is living in a residence hall should receive a dose if he did not receive a dose on or after his 16th birthday. Adults who have certain high-risk conditions should receive one or more doses of vaccine.  Hepatitis A vaccine. Adults who wish to be protected from this disease, have certain high-risk conditions, work with hepatitis A-infected animals, work in hepatitis A research labs, or travel to or work in countries with a high rate of hepatitis A should be immunized. Adults who were previously unvaccinated and who anticipate close contact with an international adoptee during the first 60 days after arrival in the [place] from a country with a high rate of hepatitis A should be immunized.  Hepatitis B vaccine. Adults who wish to be protected from this disease, have certain high-risk conditions, may be exposed to blood or other infectious body fluids, are household contacts or sex partners of hepatitis B positive people, are clients or workers in certain care facilities, or travel to or work in countries with a high rate of hepatitis B should be immunized.  Haemophilus influenzae type b (Hib) vaccine. A previously unvaccinated person with asplenia or sickle cell disease or having a scheduled splenectomy should receive 1 dose of Hib vaccine. Regardless of previous immunization, a recipient of a hematopoietic stem cell transplant  should receive a 3-dose series 6 12 months after his successful transplant. Hib vaccine is not recommended for adults with HIV infection. Preventive Service / Frequency Ages 19 to 39  Blood pressure check.** / Every 1 to 2 years.  Lipid and cholesterol check.** / Every 5 years beginning at age 20.  Hepatitis C blood test.** / For any individual with known risks for hepatitis C.  Skin self-exam. / Monthly.  Influenza vaccine. / Every year.  Tetanus, diphtheria, and acellular pertussis (Tdap, Td) vaccine.** / Consult your caregiver. 1 dose of Td every 10 years.  Varicella vaccine.** / Consult your caregiver.  HPV vaccine. / 3 doses over 6 months, if 26 and younger.  Measles, mumps, rubella (MMR) vaccine.** /   You need at least 1 dose of MMR if you were born in 1957 or later. You may also need a 2nd dose.  Pneumococcal 13-valent conjugate (PCV13) vaccine.** / Consult your caregiver.  Pneumococcal polysaccharide (PPSV23) vaccine.** / 1 to 2 doses if you smoke cigarettes or if you have certain conditions.  Meningococcal vaccine.** / 1 dose if you are age 19 to 21 years and a first-year college student living in a residence hall, or have one of several medical conditions, you need to get vaccinated against meningococcal disease. You may also need additional booster doses.  Hepatitis A vaccine.** / Consult your caregiver.  Hepatitis B vaccine.** / Consult your caregiver.  Haemophilus influenzae type b (Hib) vaccine.** / Consult your caregiver. Ages 40 to 64  Blood pressure check.** / Every 1 to 2 years.  Lipid and cholesterol check.** / Every 5 years beginning at age 20.  Lung cancer screening. / Every year if you are aged 55 80 years and have a 30-pack-year history of smoking and currently smoke or have quit within the past 15 years. Yearly screening is stopped once you have quit smoking for at least 15 years or develop a health problem that would prevent you from having lung cancer  treatment.  Fecal occult blood test (FOBT) of stool. / Every year beginning at age 50 and continuing until age 75. You may not have to do this test if you get colonoscopy every 10 years.  Flexible sigmoidoscopy** or colonoscopy.** / Every 5 years for a flexible sigmoidoscopy or every 10 years for a colonoscopy beginning at age 50 and continuing until age 75.  Hepatitis C blood test.** / For all people born from 1945 through 1965 and any individual with known risks for hepatitis C.  Skin self-exam. / Monthly.  Influenza vaccine. / Every year.  Tetanus, diphtheria, and acellular pertussis (Tdap/Td) vaccine.** / Consult your caregiver. 1 dose of Td every 10 years.  Varicella vaccine.** / Consult your caregiver.  Zoster vaccine.** / 1 dose for adults aged 60 years or older.  Measles, mumps, rubella (MMR) vaccine.** / You need at least 1 dose of MMR if you were born in 1957 or later. You may also need a 2nd dose.  Pneumococcal 13-valent conjugate (PCV13) vaccine.** / Consult your caregiver.  Pneumococcal polysaccharide (PPSV23) vaccine.** / 1 to 2 doses if you smoke cigarettes or if you have certain conditions.  Meningococcal vaccine.** / Consult your caregiver.  Hepatitis A vaccine.** / Consult your caregiver.  Hepatitis B vaccine.** / Consult your caregiver.  Haemophilus influenzae type b (Hib) vaccine.** / Consult your caregiver. Ages 65 and over  Blood pressure check.** / Every 1 to 2 years.  Lipid and cholesterol check.**/ Every 5 years beginning at age 20.  Lung cancer screening. / Every year if you are aged 55 80 years and have a 30-pack-year history of smoking and currently smoke or have quit within the past 15 years. Yearly screening is stopped once you have quit smoking for at least 15 years or develop a health problem that would prevent you from having lung cancer treatment.  Fecal occult blood test (FOBT) of stool. / Every year beginning at age 50 and continuing until  age 75. You may not have to do this test if you get colonoscopy every 10 years.  Flexible sigmoidoscopy** or colonoscopy.** / Every 5 years for a flexible sigmoidoscopy or every 10 years for a colonoscopy beginning at age 50 and continuing until age 75.  Hepatitis C blood   test.** / For all people born from 1945 through 1965 and any individual with known risks for hepatitis C.  Abdominal aortic aneurysm (AAA) screening.** / A one-time screening for ages 65 to 75 years who are current or former smokers.  Skin self-exam. / Monthly.  Influenza vaccine. / Every year.  Tetanus, diphtheria, and acellular pertussis (Tdap/Td) vaccine.** / 1 dose of Td every 10 years.  Varicella vaccine.** / Consult your caregiver.  Zoster vaccine.** / 1 dose for adults aged 60 years or older.  Pneumococcal 13-valent conjugate (PCV13) vaccine.** / Consult your caregiver.  Pneumococcal polysaccharide (PPSV23) vaccine.** / 1 dose for all adults aged 65 years and older.  Meningococcal vaccine.** / Consult your caregiver.  Hepatitis A vaccine.** / Consult your caregiver.  Hepatitis B vaccine.** / Consult your caregiver.  Haemophilus influenzae type b (Hib) vaccine.** / Consult your caregiver. **Family history and personal history of risk and conditions may change your caregiver's recommendations. Document Released: 12/04/2001 Document Revised: 02/02/2013 Document Reviewed: 03/05/2011 [employer] Patient Information 2014 [employer].  

## 2013-09-10 LAB — URINE CULTURE
Colony Count: NO GROWTH
Organism ID, Bacteria: NO GROWTH

## 2013-09-25 ENCOUNTER — Other Ambulatory Visit (INDEPENDENT_AMBULATORY_CARE_PROVIDER_SITE_OTHER): Payer: Medicare Other

## 2013-09-25 LAB — HEMOGLOBIN: Hemoglobin: 15.3 g/dL (ref 13.0–17.0)

## 2013-09-25 NOTE — Progress Notes (Signed)
Quick Note:  Continue every other month phlebotomy ______ 

## 2013-10-13 ENCOUNTER — Encounter: Payer: Self-pay | Admitting: Internal Medicine

## 2013-11-27 ENCOUNTER — Other Ambulatory Visit (INDEPENDENT_AMBULATORY_CARE_PROVIDER_SITE_OTHER): Payer: Medicare Other

## 2013-11-27 LAB — HEMATOCRIT: HCT: 48.6 % (ref 39.0–52.0)

## 2013-11-27 LAB — HEMOGLOBIN: HEMOGLOBIN: 16 g/dL (ref 13.0–17.0)

## 2013-11-30 ENCOUNTER — Other Ambulatory Visit: Payer: Self-pay

## 2013-11-30 NOTE — Progress Notes (Signed)
Quick Note:  Continue every 2 month phlebotomy Have him get a ferritin next time ______

## 2013-12-21 ENCOUNTER — Encounter: Payer: Self-pay | Admitting: *Deleted

## 2013-12-24 ENCOUNTER — Encounter: Payer: Self-pay | Admitting: Gastroenterology

## 2013-12-24 ENCOUNTER — Ambulatory Visit (INDEPENDENT_AMBULATORY_CARE_PROVIDER_SITE_OTHER): Payer: Medicare Other | Admitting: Gastroenterology

## 2013-12-24 VITALS — BP 120/90 | HR 68 | Ht 71.0 in | Wt 185.0 lb

## 2013-12-24 DIAGNOSIS — K5900 Constipation, unspecified: Secondary | ICD-10-CM

## 2013-12-24 DIAGNOSIS — Z86010 Personal history of colonic polyps: Secondary | ICD-10-CM

## 2013-12-24 DIAGNOSIS — K649 Unspecified hemorrhoids: Secondary | ICD-10-CM

## 2013-12-24 DIAGNOSIS — K625 Hemorrhage of anus and rectum: Secondary | ICD-10-CM

## 2013-12-24 MED ORDER — HYDROCORTISONE ACETATE 25 MG RE SUPP: 25.0000 mg | Freq: Every day | RECTAL | Status: DC

## 2013-12-24 MED ORDER — PRAMOXINE-HC 1-1 % EX CREA: TOPICAL_CREAM | Freq: Three times a day (TID) | CUTANEOUS | Status: DC

## 2013-12-24 NOTE — Patient Instructions (Addendum)
It has been recommended to you that you have a(n) Colonoscopy completed by Dr. Carlean Purl. Per your request, you would like to be scheduled in May.  Please contact our office middle of April at [phone] to schedule a pre-visit appointment to schedule a colonoscopy.(This is a free visit)  We have sent the following medications to your pharmacy for you to pick up at your convenience: Analpram Cream Anusol Suppository   Information on hemorrhoids is below. ___________________________________________________________________________________________________________________________________  Hemorrhoids Hemorrhoids are swollen veins around the rectum or anus. There are two types of hemorrhoids:   Internal hemorrhoids. These occur in the veins just inside the rectum. They may poke through to the outside and become irritated and painful.  External hemorrhoids. These occur in the veins outside the anus and can be felt as a painful swelling or hard lump near the anus. CAUSES  Pregnancy.   Obesity.   Constipation or diarrhea.   Straining to have a bowel movement.   Sitting for long periods on the toilet.  Heavy lifting or other activity that caused you to strain.  Anal intercourse. SYMPTOMS   Pain.   Anal itching or irritation.   Rectal bleeding.   Fecal leakage.   Anal swelling.   One or more lumps around the anus.  DIAGNOSIS  Your caregiver may be able to diagnose hemorrhoids by visual examination. Other examinations or tests that may be performed include:   Examination of the rectal area with a gloved hand (digital rectal exam).   Examination of anal canal using a small tube (scope).   A blood test if you have lost a significant amount of blood.  A test to look inside the colon (sigmoidoscopy or colonoscopy). TREATMENT Most hemorrhoids can be treated at home. However, if symptoms do not seem to be getting better or if you have a lot of rectal bleeding, your  caregiver may perform a procedure to help make the hemorrhoids get smaller or remove them completely. Possible treatments include:   Placing a rubber band at the base of the hemorrhoid to cut off the circulation (rubber band ligation).   Injecting a chemical to shrink the hemorrhoid (sclerotherapy).   Using a tool to burn the hemorrhoid (infrared light therapy).   Surgically removing the hemorrhoid (hemorrhoidectomy).   Stapling the hemorrhoid to block blood flow to the tissue (hemorrhoid stapling).  HOME CARE INSTRUCTIONS   Eat foods with fiber, such as whole grains, beans, nuts, fruits, and vegetables. Ask your doctor about taking products with added fiber in them (fibersupplements).  Increase fluid intake. Drink enough water and fluids to keep your urine clear or pale yellow.   Exercise regularly.   Go to the bathroom when you have the urge to have a bowel movement. Do not wait.   Avoid straining to have bowel movements.   Keep the anal area dry and clean. Use wet toilet paper or moist towelettes after a bowel movement.   Medicated creams and suppositories may be used or applied as directed.   Only take over-the-counter or prescription medicines as directed by your caregiver.   Take warm sitz baths for 15 20 minutes, 3 4 times a day to ease pain and discomfort.   Place ice packs on the hemorrhoids if they are tender and swollen. Using ice packs between sitz baths may be helpful.   Put ice in a plastic bag.   Place a towel between your skin and the bag.   Leave the ice on for 15 20 minutes,  3 4 times a day.   Do not use a donut-shaped pillow or sit on the toilet for long periods. This increases blood pooling and pain.  SEEK MEDICAL CARE IF:  You have increasing pain and swelling that is not controlled by treatment or medicine.  You have uncontrolled bleeding.  You have difficulty or you are unable to have a bowel movement.  You have pain or  inflammation outside the area of the hemorrhoids. MAKE SURE YOU:  Understand these instructions.  Will watch your condition.  Will get help right away if you are not doing well or get worse. Document Released: 10/05/2000 Document Revised: 09/24/2012 Document Reviewed: 08/12/2012 [facility] Patient Information 2014 [place].

## 2013-12-24 NOTE — Progress Notes (Signed)
12/24/2013 Devin Schmidt [id number] 19-Oct-1941   HISTORY OF PRESENT ILLNESS:  This is a pleasant 73 year old male who is known to Dr. 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 He also had previous colonoscopy with Dr. Carlean Purl in December 2009 at which time he was found to have 5 polyps (some tubular adenomas and some hyperplastic) a lipoma in the ascending colon, mild diverticulosis in the ascending colon, and hemorrhoids.  Repeat colonoscopy was recommended in 5 years from that time.  He comes in today stating that last weekend he had an episode of rectal bleeding with a moderate amount of red blood on the toilet paper and in the toilet bowl.  He admitted to some intermittent constipation/hard stools. Since that episode of rectal bleeding he has seen other smaller amounts of blood and has experienced some rectal discomfort, mostly with hard stools. He does not take anything for his constipation and is reluctant to take or use any medications.  He was asking about having the hemorrhoids removed since he had some removed in the past.  He denies any other complaints.   Past Medical History  Diagnosis Date  . URI (upper respiratory infection)   . Inflamed seborrheic keratosis   . Hemochromatosis     HX OF  . Colon polyps     Tubular Adenoma and Hyperplastic    Past Surgical History  Procedure Laterality Date  . Appendectomy    . Tonsillectomy    . Colonoscopy w/ biopsies  10/20/2008    reports that he quit smoking about 4 years ago. His smoking use included Cigarettes. He has a 50 pack-year smoking history. He has never used smokeless tobacco. He reports that he drinks about 1.0 ounces of alcohol per week. He reports that he does not use illicit drugs. family history includes . No Known Allergies    Outpatient Encounter Prescriptions as of 12/24/2013  Medication Sig  . primidone (MYSOLINE) 50 MG tablet Take 50 mg by mouth once.  . hydrocortisone (ANUSOL-HC) 25 MG suppository Place 1  suppository (25 mg total) rectally at bedtime.  . pramoxine-hydrocortisone (ANALPRAM HC) cream Apply topically 3 (three) times daily.     REVIEW OF SYSTEMS  : All other systems reviewed and negative except where noted in the History of Present Illness.   PHYSICAL EXAM: BP 120/90  Pulse 68  Ht 5\' 11"  (1.803 m)  Wt 185 lb (83.915 kg)  BMI 25.81 kg/m2 General: Well developed white male in no acute distress Head: Normocephalic and atraumatic Eyes:  Sclerae anicteric, conjunctiva pink. Ears: Normal auditory acuity.  Lungs: Clear throughout to auscultation Heart: Regular rate and rhythm Abdomen: Soft, non-distended.  Normal bowel sounds.  Non-tender. Rectal:  Very small hemorrhoids noted just inside the anus.  No large hemorrhoids felt or seen.  No other masses noted.  No pain on DRE.  Soft stool felt in rectal vault.  Light brown heme negative stool on exam glove. Musculoskeletal: Symmetrical with no gross deformities  Skin: No lesions on visible extremities Extremities: No edema  Neurological: Alert oriented x 4, grossly non-focal Cervical Nodes:  No significant cervical adenopathy Psychological:  Alert and cooperative. Normal mood and affect  ASSESSMENT AND PLAN: -Rectal bleeding and discomfort:  Small hemorrhoids on exam with no active bleeding.  Heme negative.  No other masses noted.  Recommended daily stool softeners. -History of colon polyps, both hyperplastic and tubular adenomas, on colonoscopy in 09/2008.  *Needs routine surveillance colonoscopy.  Will schedule.  The risks, benefits, and alternatives were  discussed with the patient and he consents to proceed.  Will give anusol suppositories to use at bedtime and hydrocortisone cream to apply externally BID.  Can discuss any further intervention for hemorrhoids with Dr. Carlean Purl after colonoscopy and attempt at conservative therapy.

## 2013-12-24 NOTE — Progress Notes (Signed)
Agree with Ms. Zehr's management. 

## 2013-12-28 ENCOUNTER — Telehealth: Payer: Self-pay | Admitting: Gastroenterology

## 2013-12-28 MED ORDER — HYDROCORTISONE ACE-PRAMOXINE 1-1 % RE FOAM: 1.0000 | Freq: Two times a day (BID) | RECTAL | Status: DC

## 2013-12-28 NOTE — Telephone Encounter (Signed)
Is that for the generic hydrocortisone? We could try proctofoam or something similar, but I don't which will be covered by his insurance...  Jess

## 2013-12-28 NOTE — Telephone Encounter (Signed)
Patient is unable to afford Anusol  HC suppositories. Requesting OTC or something else. Please, advise.

## 2013-12-28 NOTE — Telephone Encounter (Signed)
Left a message for patient to call me. 

## 2013-12-28 NOTE — Telephone Encounter (Signed)
Spoke with patient and told him Proctofoam has been sent to pharmacy. Can use it BID. Try to use it a least at bedtime.

## 2014-01-21 ENCOUNTER — Other Ambulatory Visit (INDEPENDENT_AMBULATORY_CARE_PROVIDER_SITE_OTHER): Payer: Medicare Other

## 2014-01-21 ENCOUNTER — Other Ambulatory Visit: Payer: Self-pay

## 2014-01-21 LAB — CBC
HCT: 45 % (ref 39.0–52.0)
HEMOGLOBIN: 15.1 g/dL (ref 13.0–17.0)
MCHC: 33.6 g/dL (ref 30.0–36.0)
MCV: 99.2 fl (ref 78.0–100.0)
Platelets: 177 10*3/uL (ref 150.0–400.0)
RBC: 4.54 Mil/uL (ref 4.22–5.81)
RDW: 12.9 % (ref 11.5–14.6)
WBC: 7.9 10*3/uL (ref 4.5–10.5)

## 2014-01-21 LAB — FERRITIN: FERRITIN: 34.5 ng/mL (ref 22.0–322.0)

## 2014-01-21 NOTE — Progress Notes (Signed)
Quick Note:  Continue monthly phlebotomy ______ 

## 2014-01-21 NOTE — Progress Notes (Signed)
Quick Note:  No, stay every 2 months was faked out by 1 month ago label on labs ______

## 2014-02-03 ENCOUNTER — Encounter: Payer: Self-pay | Admitting: Internal Medicine

## 2014-02-04 ENCOUNTER — Encounter: Payer: Self-pay | Admitting: Internal Medicine

## 2014-02-09 ENCOUNTER — Other Ambulatory Visit: Payer: Self-pay | Admitting: Family Medicine

## 2014-02-09 DIAGNOSIS — J209 Acute bronchitis, unspecified: Secondary | ICD-10-CM

## 2014-02-09 MED ORDER — AZITHROMYCIN 250 MG PO TABS: ORAL_TABLET | ORAL | Status: DC

## 2014-03-26 ENCOUNTER — Other Ambulatory Visit (INDEPENDENT_AMBULATORY_CARE_PROVIDER_SITE_OTHER): Payer: Medicare Other

## 2014-03-26 LAB — CBC
HCT: 48 % (ref 39.0–52.0)
Hemoglobin: 16.2 g/dL (ref 13.0–17.0)
MCHC: 33.8 g/dL (ref 30.0–36.0)
MCV: 99.2 fl (ref 78.0–100.0)
PLATELETS: 188 10*3/uL (ref 150.0–400.0)
RBC: 4.84 Mil/uL (ref 4.22–5.81)
RDW: 13 % (ref 11.5–15.5)
WBC: 6.7 10*3/uL (ref 4.0–10.5)

## 2014-03-29 ENCOUNTER — Telehealth: Payer: Self-pay | Admitting: Neurology

## 2014-03-29 MED ORDER — PRIMIDONE 50 MG PO TABS: 50.0000 mg | ORAL_TABLET | Freq: Once | ORAL | Status: DC

## 2014-03-29 NOTE — Telephone Encounter (Signed)
Per last office note- patient to remain on medication. Refill approved and sent to patient's pharmacy for a one month supply with note that patient needs an appt for follow up before any further refills.

## 2014-03-29 NOTE — Telephone Encounter (Signed)
Pt called requesting a refill for PRIMDONE 50mg  one tablet a day

## 2014-03-30 ENCOUNTER — Encounter: Payer: Medicare Other | Admitting: Internal Medicine

## 2014-03-31 ENCOUNTER — Other Ambulatory Visit: Payer: Self-pay

## 2014-03-31 NOTE — Progress Notes (Signed)
Quick Note:  Continue monthly phlebotomy Check ferritin next time ______

## 2014-04-01 ENCOUNTER — Ambulatory Visit (AMBULATORY_SURGERY_CENTER): Payer: Self-pay

## 2014-04-01 ENCOUNTER — Telehealth: Payer: Self-pay | Admitting: Neurology

## 2014-04-01 VITALS — Ht 72.0 in | Wt 180.0 lb

## 2014-04-01 DIAGNOSIS — Z860100 Personal history of colon polyps, unspecified: Secondary | ICD-10-CM

## 2014-04-01 MED ORDER — SUPREP BOWEL PREP KIT 17.5-3.13-1.6 GM/177ML PO SOLN: 1.0000 | Freq: Once | ORAL | Status: DC

## 2014-04-01 NOTE — Progress Notes (Signed)
No allergies to eggs or soy No past problems with anesthesia No home oxygen No diet/weight loss meds  No email 

## 2014-04-15 ENCOUNTER — Ambulatory Visit (AMBULATORY_SURGERY_CENTER): Payer: Medicare Other | Admitting: Internal Medicine

## 2014-04-15 ENCOUNTER — Encounter: Payer: Self-pay | Admitting: Internal Medicine

## 2014-04-15 VITALS — BP 128/79 | HR 58 | Temp 96.6°F | Resp 21 | Ht 72.0 in | Wt 180.0 lb

## 2014-04-15 DIAGNOSIS — Z860100 Personal history of colon polyps, unspecified: Secondary | ICD-10-CM | POA: Insufficient documentation

## 2014-04-15 DIAGNOSIS — K648 Other hemorrhoids: Secondary | ICD-10-CM

## 2014-04-15 DIAGNOSIS — K640 First degree hemorrhoids: Secondary | ICD-10-CM

## 2014-04-15 DIAGNOSIS — K649 Unspecified hemorrhoids: Secondary | ICD-10-CM

## 2014-04-15 DIAGNOSIS — D126 Benign neoplasm of colon, unspecified: Secondary | ICD-10-CM

## 2014-04-15 MED ORDER — SODIUM CHLORIDE 0.9 % IV SOLN: 500.0000 mL | INTRAVENOUS | Status: DC

## 2014-04-15 NOTE — Op Note (Addendum)
[place]  [address] [place], [id number]   COLONOSCOPY PROCEDURE REPORT  PATIENT: Devin Schmidt, Devin Schmidt.  MR#: [id number] BIRTHDATE: 01-Jan-1941 , 52  yrs. old GENDER: Male ENDOSCOPIST: Gatha Mayer, MD, [facility] PROCEDURE DATE:  04/15/2014 PROCEDURE:   Colonoscopy with biopsy and snare polypectomy First Screening Colonoscopy - Avg.  risk and is 50 yrs.  old or older - No.  Prior Negative Screening - Now for repeat screening. N/A  History of Adenoma - Now for follow-up colonoscopy & has been > or = to 3 yrs.  Polyps Removed Today? Yes. ASA CLASS:   Class III INDICATIONS:Patient's personal history of adenomatous colon polyps.  MEDICATIONS: Propofol (Diprivan) 270 mg IV, MAC sedation, administered by CRNA, and These medications were titrated to patient response per physician's verbal order  DESCRIPTION OF PROCEDURE:   After the risks benefits and alternatives of the procedure were thoroughly explained, informed consent was obtained.  A digital rectal exam revealed no abnormalities of the rectum and A digital rectal exam revealed no prostatic nodules.   The LB [id number]  endoscope was introduced through the anus and advanced to the cecum, which was identified by both the appendix and ileocecal valve. No adverse events experienced.   The quality of the prep was excellent using Suprep  The instrument was then slowly withdrawn as the colon was fully examined.      COLON FINDINGS: Seven sessile polyps measuring 3-6 mm in size were found at the cecum, in the ascending colon, transverse colon, and descending colon.  A polypectomy was performed with cold forceps and with a cold snare.  The resection was complete and the polyp tissue was completely retrieved.   Lipoma in right colon also. Small internal hemorrhoids were found.   The colon mucosa was otherwise normal.   A right colon retroflexion was performed. Retroflexed views revealed internal hemorrhoids. The  time to cecum=2 minutes 05 seconds.  Withdrawal time=16 minutes 39 seconds. The scope was withdrawn and the procedure completed. COMPLICATIONS: There were no complications.  ENDOSCOPIC IMPRESSION: 1.   Seven sessile polyps measuring 3-6 mm in size were found at the cecum, in the ascending colon, transverse colon, and descending colon; polypectomy was performed with cold forceps and with a cold snare 2.   Small internal hemorrhoids 3.    Lipoma, right colon 4.   The colon mucosa was otherwise normal - excellent prep - hx adenoms 2009 max 12 mm  RECOMMENDATIONS: Timing of repeat colonoscopy will be determined by pathology findings.   eSigned:  Gatha Mayer, MD, [facility] 04/15/2014 11:16 AM Revised: 04/15/2014 11:16 AM  cc: The Patient   PATIENT NAME:  Devin Schmidt, Devin Schmidt. MR#: [id number]

## 2014-04-15 NOTE — Patient Instructions (Addendum)
I found and removed 7 polyps - this means I will probably recommend a repeat colonoscopy given your overall health. We can make a "game-time decision" when the interval passes.  The hemorrhoids are not enlarged. I saw a suture from prior surgery there.  I will let you know pathology results and when to have another routine colonoscopy by mail. I appreciate the opportunity to care for you. Gatha Mayer, MD, FACG   YOU HAD AN ENDOSCOPIC PROCEDURE TODAY AT [facility]: Refer to the procedure report that was given to you for any specific questions about what was found during the examination.  If the procedure report does not answer your questions, please call your gastroenterologist to clarify.  If you requested that your care partner not be given the details of your procedure findings, then the procedure report has been included in a sealed envelope for you to review at your convenience later.  YOU SHOULD EXPECT: Some feelings of bloating in the abdomen. Passage of more gas than usual.  Walking can help get rid of the air that was put into your GI tract during the procedure and reduce the bloating. If you had a lower endoscopy (such as a colonoscopy or flexible sigmoidoscopy) you may notice spotting of blood in your stool or on the toilet paper. If you underwent a bowel prep for your procedure, then you may not have a normal bowel movement for a few days.  DIET: Your first meal following the procedure should be a light meal and then it is ok to progress to your normal diet.  A half-sandwich or bowl of soup is an example of a good first meal.  Heavy or fried foods are harder to digest and may make you feel nauseous or bloated.  Likewise meals heavy in dairy and vegetables can cause extra gas to form and this can also increase the bloating.  Drink plenty of fluids but you should avoid alcoholic beverages for 24 hours.  ACTIVITY: Your care partner should take you home directly after the  procedure.  You should plan to take it easy, moving slowly for the rest of the day.  You can resume normal activity the day after the procedure however you should NOT DRIVE or use heavy machinery for 24 hours (because of the sedation medicines used during the test).    SYMPTOMS TO REPORT IMMEDIATELY: A gastroenterologist can be reached at any hour.  During normal business hours, 8:30 AM to 5:00 PM Monday through Friday, call [phone].  After hours and on weekends, please call the GI answering service at [phone] who will take a message and have the physician on call contact you.   Following lower endoscopy (colonoscopy or flexible sigmoidoscopy):  Excessive amounts of blood in the stool  Significant tenderness or worsening of abdominal pains  Swelling of the abdomen that is new, acute  Fever of 100F or higher   FOLLOW UP: If any biopsies were taken you will be contacted by phone or by letter within the next 1-3 weeks.  Call your gastroenterologist if you have not heard about the biopsies in 3 weeks.  Our staff will call the home number listed on your records the next business day following your procedure to check on you and address any questions or concerns that you may have at that time regarding the information given to you following your procedure. This is a courtesy call and so if there is no answer at the home number  and we have not heard from you through the emergency physician on call, we will assume that you have returned to your regular daily activities without incident.  SIGNATURES/CONFIDENTIALITY: You and/or your care partner have signed paperwork which will be entered into your electronic medical record.  These signatures attest to the fact that that the information above on your After Visit Summary has been reviewed and is understood.  Full responsibility of the confidentiality of this discharge information lies with you and/or your care-partner.   Polyp and hemorrhoid  information given.

## 2014-04-15 NOTE — Progress Notes (Signed)
Called to room to assist during endoscopic procedure.  Patient ID and intended procedure confirmed with present staff. Received instructions for my participation in the procedure from the performing physician.  

## 2014-04-15 NOTE — Progress Notes (Signed)
Report to PACU, RN, vss, BBS= Clear.  

## 2014-04-16 ENCOUNTER — Telehealth: Payer: Self-pay

## 2014-04-16 NOTE — Telephone Encounter (Signed)
  Follow up Call-  Call back number 04/15/2014  Post procedure Call Back phone  # [phone]  Permission to leave phone message Yes     Left message on answering machine.

## 2014-04-22 ENCOUNTER — Encounter: Payer: Self-pay | Admitting: Internal Medicine

## 2014-04-22 NOTE — Progress Notes (Signed)
Quick Note:  6 adenomas (max 6 mm)and a hyperplastic polyp - consider repeating a colonoscopy in 3-5 yrs (recall 2018) ______

## 2014-05-12 ENCOUNTER — Telehealth: Payer: Self-pay | Admitting: Neurology

## 2014-05-12 MED ORDER — PRIMIDONE 50 MG PO TABS: 50.0000 mg | ORAL_TABLET | Freq: Once | ORAL | Status: DC

## 2014-05-12 NOTE — Telephone Encounter (Signed)
1 month refill sent to pharmacy for Primidone. He was instructed he had to keep appt on 05/26/2014 or he would not be prescribed any additional refills.

## 2014-05-12 NOTE — Telephone Encounter (Signed)
Pt made appt to see DR Tat on 05-26-14 and states that he will run out of medication before that please call pt at [phone]

## 2014-05-14 ENCOUNTER — Ambulatory Visit: Payer: Medicare Other | Admitting: Neurology

## 2014-05-19 NOTE — Telephone Encounter (Signed)
error 

## 2014-05-26 ENCOUNTER — Ambulatory Visit (INDEPENDENT_AMBULATORY_CARE_PROVIDER_SITE_OTHER): Payer: Medicare Other | Admitting: Neurology

## 2014-05-26 ENCOUNTER — Encounter: Payer: Self-pay | Admitting: Neurology

## 2014-05-26 ENCOUNTER — Other Ambulatory Visit (INDEPENDENT_AMBULATORY_CARE_PROVIDER_SITE_OTHER): Payer: Medicare Other

## 2014-05-26 VITALS — BP 100/72 | HR 84 | Ht 72.0 in | Wt 185.0 lb

## 2014-05-26 DIAGNOSIS — G252 Other specified forms of tremor: Secondary | ICD-10-CM

## 2014-05-26 DIAGNOSIS — G250 Essential tremor: Secondary | ICD-10-CM

## 2014-05-26 LAB — CBC
HCT: 47.2 % (ref 39.0–52.0)
HEMOGLOBIN: 16 g/dL (ref 13.0–17.0)
MCHC: 34 g/dL (ref 30.0–36.0)
MCV: 98.2 fl (ref 78.0–100.0)
PLATELETS: 184 10*3/uL (ref 150.0–400.0)
RBC: 4.81 Mil/uL (ref 4.22–5.81)
RDW: 13.4 % (ref 11.5–15.5)
WBC: 6.2 10*3/uL (ref 4.0–10.5)

## 2014-05-26 LAB — FERRITIN: Ferritin: 27.2 ng/mL (ref 22.0–322.0)

## 2014-05-26 MED ORDER — PRIMIDONE 50 MG PO TABS: 50.0000 mg | ORAL_TABLET | Freq: Every day | ORAL | Status: DC

## 2014-05-26 NOTE — Progress Notes (Signed)
Subjective:   Devin Schmidt was seen in consultation in the movement disorder clinic at for followup regarding his essential tremor.  He was last seen in January.  Time, we started him on primidone.  The patient states that he is 80-85% better.  He is very pleased with the medication.  He denies any side effects.  He does state that he combines the primidone with a scotch, then he is 100% better.  However, he does not do that often and has not had a scotch in over a month.  He does continue to have blood letting every 2 months for his hemachromatosis.  He has been quite stable in that regard.  He did have a workup for peripheral neuropathy since last visit.  There was no monoclonal protein the serum.  B12 was 1020, folate greater than 24.8, RPR negative, vitamin D 31 and TSH 0.60.  05/26/14 update:  The patient is returning for his yearly follow up.  He is doing well on primidone 50 mg daily.  Will rarely take it twice a day if needed.  Does notice that drinking a glass of whiskey makes tremor go away. Tremor is well controlled with the primidone.  No SE.  Had colonoscopy 2 months ago and that was uneventful.     Current/Previously tried tremor medications: xanax, fatigue and no help. Now on primidone, helpful  Current medications that may exacerbate tremor:  n/a   No Known Allergies  No current outpatient prescriptions on file prior to visit.   No current facility-administered medications on file prior to visit.    Past Medical History  Diagnosis Date  . URI (upper respiratory infection)   . Inflamed seborrheic keratosis   . Hemochromatosis     HX OF  . Colon polyps     Tubular Adenoma and Hyperplastic     Past Surgical History  Procedure Laterality Date  . Appendectomy    . Tonsillectomy    . Colonoscopy w/ biopsies  10/20/2008  . Hemorrhoid surgery      History   Social History  . Marital Status: Single    Spouse Name: N/A    Number of Children: 1  . Years of Education:  N/A   Occupational History  . RETIRED    Social History Main Topics  . Smoking status: Former Smoker -- 1.00 packs/day for 50 years    Types: Cigarettes    Quit date: 08/14/2009  . Smokeless tobacco: Never Used  . Alcohol Use: 1.0 oz/week    2 drink(s) per week     Comment: rare (one time per month)  . Drug Use: No  . Sexual Activity: Not Currently    Partners: Female   Other Topics Concern  . Not on file   Social History Narrative   Exercise-- , except golf, swims     Family Status  Relation Status Death Age  . Mother Deceased 22    old age  . Father Deceased 50    MVA  . Brother Alive     detached retina  . Child Alive     healthy    Review of Systems A complete 10 system ROS was obtained and was negative apart from what is mentioned.   Objective:   VITALS:   Filed Vitals:   05/26/14 0930  BP: 100/72  Pulse: 84  Height: 6' (1.829 m)  Weight: 185 lb (83.915 kg)   Gen:  Appears stated age and in NAD. HEENT:  Normocephalic, atraumatic.  The mucous membranes are moist. The superficial temporal arteries are without ropiness or tenderness. Cardiovascular: Regular rate and rhythm. Lungs: Clear to auscultation bilaterally. Neck: There are no carotid bruits noted bilaterally.  NEUROLOGICAL:  Orientation:  The patient is alert and oriented x 3.  Recent and remote memory are intact.  Attention span and concentration are normal.  Able to name objects and repeat without trouble.  Fund of knowledge is appropriate Cranial nerves: There is good facial symmetry. Marland Kitchen Speech is fluent and clear. Soft palate rises symmetrically and there is no tongue deviation. Hearing is intact to conversational tone. Tone: Tone is good throughout. Sensation: Sensation is intact to light touch throughout. Coordination:  The patient has no dysdiadichokinesia or dysmetria. Motor: Strength is 5/5 in the bilateral upper and lower extremities.  Shoulder shrug is equal bilaterally.  There is no  pronator drift.  There are no fasciculations noted. Gait and Station: The patient is able to ambulate without difficulty.   MOVEMENT EXAM: Tremor:  There is minimal tremor in the UE, noted most significantly with action.  The patients Archimedes spirals are greatly improved from one primidone was initially started.  There is no tremor at rest.    LABS:  Lab Results  Component Value Date   WBC 6.7 03/26/2014   HGB 16.2 03/26/2014   HCT 48.0 03/26/2014   MCV 99.2 03/26/2014   PLT 188.0 03/26/2014   Lab Results  Component Value Date   TSH 0.60 11/20/2012   Lab Results  Component Value Date   VITAMINB12 1020* 11/20/2012     Chemistry      Component Value Date/Time   NA 137 09/09/2013 0914   K 4.3 09/09/2013 0914   CL 103 09/09/2013 0914   CO2 27 09/09/2013 0914   BUN 15 09/09/2013 0914   CREATININE 1.2 09/09/2013 0914      Component Value Date/Time   CALCIUM 9.3 09/09/2013 0914   ALKPHOS 69 09/09/2013 0914   AST 14 09/09/2013 0914   ALT 14 09/09/2013 0914   BILITOT 1.0 09/09/2013 0914          Assessment:   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  The patient has been stable with qomonth blood letting. 3.  PE evidence of PN.   Plan:    -the patient is doing very well on primidone.  He will remain on this medication.  The patient would like to receive refills in the future from his primary care physician.  I refilled that today with 1 years worth of refills, but after that, if he wants to receive it from his primary care physician, I certainly have no objection as long as she doesn't.  I did send her an e-mail in that regard today.  If he needs me in the future, I would be happy see him back.

## 2014-05-27 NOTE — Progress Notes (Signed)
Quick Note:  Continue monthly phlebotomy ______

## 2014-07-26 ENCOUNTER — Other Ambulatory Visit (INDEPENDENT_AMBULATORY_CARE_PROVIDER_SITE_OTHER): Payer: Medicare Other

## 2014-07-26 LAB — CBC
HEMATOCRIT: 44.2 % (ref 39.0–52.0)
Hemoglobin: 15.3 g/dL (ref 13.0–17.0)
MCHC: 34.6 g/dL (ref 30.0–36.0)
MCV: 97.7 fl (ref 78.0–100.0)
PLATELETS: 175 10*3/uL (ref 150.0–400.0)
RBC: 4.53 Mil/uL (ref 4.22–5.81)
RDW: 12.9 % (ref 11.5–15.5)
WBC: 7.7 10*3/uL (ref 4.0–10.5)

## 2014-07-27 NOTE — Progress Notes (Signed)
Quick Note:  Continue phlebotomy every 2 mos ______

## 2014-09-01 ENCOUNTER — Telehealth: Payer: Self-pay | Admitting: Family Medicine

## 2014-09-01 NOTE — Telephone Encounter (Signed)
Ok to write rx---- with 5 refills

## 2014-09-01 NOTE — Telephone Encounter (Signed)
Caller name: Ajeet Relation to pt: self Call back number: [phone] Pharmacy: [facility] on [address]  Reason for call:   Patient states that he does not need to see a neurologist, Dr. Hall Busing anymore. He says that Dr. Hall Busing told him that Dr. Etter Sjogren can start prescribing pills for him. He needs primidone 50mg  refill sent to pharmacy

## 2014-09-01 NOTE — Telephone Encounter (Signed)
Please advise      KP 

## 2014-09-02 MED ORDER — PRIMIDONE 50 MG PO TABS: 50.0000 mg | ORAL_TABLET | Freq: Every day | ORAL | Status: DC

## 2014-09-02 NOTE — Telephone Encounter (Signed)
Rx faxed.    KP 

## 2014-09-20 ENCOUNTER — Ambulatory Visit (INDEPENDENT_AMBULATORY_CARE_PROVIDER_SITE_OTHER): Payer: Medicare Other | Admitting: Family Medicine

## 2014-09-20 ENCOUNTER — Encounter: Payer: Medicare Other | Admitting: Family Medicine

## 2014-09-20 ENCOUNTER — Encounter: Payer: Self-pay | Admitting: Family Medicine

## 2014-09-20 VITALS — BP 102/62 | HR 61 | Temp 97.8°F | Ht 72.0 in | Wt 187.6 lb

## 2014-09-20 DIAGNOSIS — Z136 Encounter for screening for cardiovascular disorders: Secondary | ICD-10-CM

## 2014-09-20 DIAGNOSIS — Z0000 Encounter for general adult medical examination without abnormal findings: Secondary | ICD-10-CM

## 2014-09-20 DIAGNOSIS — R350 Frequency of micturition: Secondary | ICD-10-CM

## 2014-09-20 LAB — CBC WITH DIFFERENTIAL/PLATELET
BASOS ABS: 0 10*3/uL (ref 0.0–0.1)
Basophils Relative: 0.2 % (ref 0.0–3.0)
EOS ABS: 0.2 10*3/uL (ref 0.0–0.7)
Eosinophils Relative: 2.2 % (ref 0.0–5.0)
HCT: 49.3 % (ref 39.0–52.0)
Hemoglobin: 16.5 g/dL (ref 13.0–17.0)
LYMPHS PCT: 19.5 % (ref 12.0–46.0)
Lymphs Abs: 1.4 10*3/uL (ref 0.7–4.0)
MCHC: 33.6 g/dL (ref 30.0–36.0)
MCV: 98.4 fl (ref 78.0–100.0)
MONOS PCT: 9.8 % (ref 3.0–12.0)
Monocytes Absolute: 0.7 10*3/uL (ref 0.1–1.0)
Neutro Abs: 5 10*3/uL (ref 1.4–7.7)
Neutrophils Relative %: 68.3 % (ref 43.0–77.0)
PLATELETS: 196 10*3/uL (ref 150.0–400.0)
RBC: 5.01 Mil/uL (ref 4.22–5.81)
RDW: 13.2 % (ref 11.5–15.5)
WBC: 7.3 10*3/uL (ref 4.0–10.5)

## 2014-09-20 LAB — BASIC METABOLIC PANEL
BUN: 16 mg/dL (ref 6–23)
CALCIUM: 8.7 mg/dL (ref 8.4–10.5)
CO2: 27 mEq/L (ref 19–32)
Chloride: 101 mEq/L (ref 96–112)
Creatinine, Ser: 1.2 mg/dL (ref 0.4–1.5)
GFR: 61.41 mL/min (ref >60.00)
GLUCOSE: 88 mg/dL (ref 70–99)
Potassium: 4 mEq/L (ref 3.5–5.1)
Sodium: 138 mEq/L (ref 135–145)

## 2014-09-20 LAB — LIPID PANEL
Cholesterol: 197 mg/dL (ref 0–200)
HDL: 45.8 mg/dL (ref >39.00)
LDL CALC: 114 mg/dL — AB (ref 0–99)
NONHDL: 151.2
Total CHOL/HDL Ratio: 4
Triglycerides: 186 mg/dL — ABNORMAL HIGH (ref 0.0–149.0)
VLDL: 37.2 mg/dL (ref 0.0–40.0)

## 2014-09-20 LAB — HEPATIC FUNCTION PANEL
ALK PHOS: 84 U/L (ref 39–117)
ALT: 15 U/L (ref 0–53)
AST: 16 U/L (ref 0–37)
Albumin: 4.2 g/dL (ref 3.5–5.2)
BILIRUBIN TOTAL: 0.7 mg/dL (ref 0.2–1.2)
Bilirubin, Direct: 0.1 mg/dL (ref 0.0–0.3)
Total Protein: 6.6 g/dL (ref 6.0–8.3)

## 2014-09-20 LAB — PSA: PSA: 1.25 ng/mL (ref 0.10–4.00)

## 2014-09-20 NOTE — Patient Instructions (Signed)
Preventive Care for Adults A healthy lifestyle and preventive care can promote health and wellness. Preventive health guidelines for men include the following key practices:  A routine yearly physical is a good way to check with your health care provider about your health and preventative screening. It is a chance to share any concerns and updates on your health and to receive a thorough exam.  Visit your dentist for a routine exam and preventative care every 6 months. Brush your teeth twice a day and floss once a day. Good oral hygiene prevents tooth decay and gum disease.  The frequency of eye exams is based on your age, health, family medical history, use of contact lenses, and other factors. Follow your health care provider's recommendations for frequency of eye exams.  Eat a healthy diet. Foods such as vegetables, fruits, whole grains, low-fat dairy products, and lean protein foods contain the nutrients you need without too many calories. Decrease your intake of foods high in solid fats, added sugars, and salt. Eat the right amount of calories for you.Get information about a proper diet from your health care provider, if necessary.  Regular physical exercise is one of the most important things you can do for your health. Most adults should get at least 150 minutes of moderate-intensity exercise (any activity that increases your heart rate and causes you to sweat) each week. In addition, most adults need muscle-strengthening exercises on 2 or more days a week.  Maintain a healthy weight. The body mass index (BMI) is a screening tool to identify possible weight problems. It provides an estimate of body fat based on height and weight. Your health care provider can find your BMI and can help you achieve or maintain a healthy weight.For adults 20 years and older:  A BMI below 18.5 is considered underweight.  A BMI of 18.5 to 24.9 is normal.  A BMI of 25 to 29.9 is considered overweight.  A BMI  of 30 and above is considered obese.  Maintain normal blood lipids and cholesterol levels by exercising and minimizing your intake of saturated fat. Eat a balanced diet with plenty of fruit and vegetables. Blood tests for lipids and cholesterol should begin at age 50 and be repeated every 5 years. If your lipid or cholesterol levels are high, you are over 50, or you are at high risk for heart disease, you may need your cholesterol levels checked more frequently.Ongoing high lipid and cholesterol levels should be treated with medicines if diet and exercise are not working.  If you smoke, find out from your health care provider how to quit. If you do not use tobacco, do not start.  Lung cancer screening is recommended for adults aged 73-80 years who are at high risk for developing lung cancer because of a history of smoking. A yearly low-dose CT scan of the lungs is recommended for people who have at least a 30-pack-year history of smoking and are a current smoker or have quit within the past 15 years. A pack year of smoking is smoking an average of 1 pack of cigarettes a day for 1 year (for example: 1 pack a day for 30 years or 2 packs a day for 15 years). Yearly screening should continue until the smoker has stopped smoking for at least 15 years. Yearly screening should be stopped for people who develop a health problem that would prevent them from having lung cancer treatment.  If you choose to drink alcohol, do not have more than  2 drinks per day. One drink is considered to be 12 ounces (355 mL) of beer, 5 ounces (148 mL) of wine, or 1.5 ounces (44 mL) of liquor.  Avoid use of street drugs. Do not share needles with anyone. Ask for help if you need support or instructions about stopping the use of drugs.  High blood pressure causes heart disease and increases the risk of stroke. Your blood pressure should be checked at least every 1-2 years. Ongoing high blood pressure should be treated with  medicines, if weight loss and exercise are not effective.  If you are 45-79 years old, ask your health care provider if you should take aspirin to prevent heart disease.  Diabetes screening involves taking a blood sample to check your fasting blood sugar level. This should be done once every 3 years, after age 45, if you are within normal weight and without risk factors for diabetes. Testing should be considered at a younger age or be carried out more frequently if you are overweight and have at least 1 risk factor for diabetes.  Colorectal cancer can be detected and often prevented. Most routine colorectal cancer screening begins at the age of 50 and continues through age 75. However, your health care provider may recommend screening at an earlier age if you have risk factors for colon cancer. On a yearly basis, your health care provider may provide home test kits to check for hidden blood in the stool. Use of a small camera at the end of a tube to directly examine the colon (sigmoidoscopy or colonoscopy) can detect the earliest forms of colorectal cancer. Talk to your health care provider about this at age 50, when routine screening begins. Direct exam of the colon should be repeated every 5-10 years through age 75, unless early forms of precancerous polyps or small growths are found.  People who are at an increased risk for hepatitis B should be screened for this virus. You are considered at high risk for hepatitis B if:  You were born in a country where hepatitis B occurs often. Talk with your health care provider about which countries are considered high risk.  Your parents were born in a high-risk country and you have not received a shot to protect against hepatitis B (hepatitis B vaccine).  You have HIV or AIDS.  You use needles to inject street drugs.  You live with, or have sex with, someone who has hepatitis B.  You are a man who has sex with other men (MSM).  You get hemodialysis  treatment.  You take certain medicines for conditions such as cancer, organ transplantation, and autoimmune conditions.  Hepatitis C blood testing is recommended for all people born from 1945 through 1965 and any individual with known risks for hepatitis C.  Practice safe sex. Use condoms and avoid high-risk sexual practices to reduce the spread of sexually transmitted infections (STIs). STIs include gonorrhea, chlamydia, syphilis, trichomonas, herpes, HPV, and human immunodeficiency virus (HIV). Herpes, HIV, and HPV are viral illnesses that have no cure. They can result in disability, cancer, and death.  If you are at risk of being infected with HIV, it is recommended that you take a prescription medicine daily to prevent HIV infection. This is called preexposure prophylaxis (PrEP). You are considered at risk if:  You are a man who has sex with other men (MSM) and have other risk factors.  You are a heterosexual man, are sexually active, and are at increased risk for HIV infection.    You take drugs by injection.  You are sexually active with a partner who has HIV.  Talk with your health care provider about whether you are at high risk of being infected with HIV. If you choose to begin PrEP, you should first be tested for HIV. You should then be tested every 3 months for as long as you are taking PrEP.  A one-time screening for abdominal aortic aneurysm (AAA) and surgical repair of large AAAs by ultrasound are recommended for men ages 32 to 67 years who are current or former smokers.  Healthy men should no longer receive prostate-specific antigen (PSA) blood tests as part of routine cancer screening. Talk with your health care provider about prostate cancer screening.  Testicular cancer screening is not recommended for adult males who have no symptoms. Screening includes self-exam, a health care provider exam, and other screening tests. Consult with your health care provider about any symptoms  you have or any concerns you have about testicular cancer.  Use sunscreen. Apply sunscreen liberally and repeatedly throughout the day. You should seek shade when your shadow is shorter than you. Protect yourself by wearing long sleeves, pants, a wide-brimmed hat, and sunglasses year round, whenever you are outdoors.  Once a month, do a whole-body skin exam, using a mirror to look at the skin on your back. Tell your health care provider about new moles, moles that have irregular borders, moles that are larger than a pencil eraser, or moles that have changed in shape or color.  Stay current with required vaccines (immunizations).  Influenza vaccine. All adults should be immunized every year.  Tetanus, diphtheria, and acellular pertussis (Td, Tdap) vaccine. An adult who has not previously received Tdap or who does not know his vaccine status should receive 1 dose of Tdap. This initial dose should be followed by tetanus and diphtheria toxoids (Td) booster doses every 10 years. Adults with an unknown or incomplete history of completing a 3-dose immunization series with Td-containing vaccines should begin or complete a primary immunization series including a Tdap dose. Adults should receive a Td booster every 10 years.  Varicella vaccine. An adult without evidence of immunity to varicella should receive 2 doses or a second dose if he has previously received 1 dose.  Human papillomavirus (HPV) vaccine. Males aged 68-21 years who have not received the vaccine previously should receive the 3-dose series. Males aged 22-26 years may be immunized. Immunization is recommended through the age of 6 years for any male who has sex with males and did not get any or all doses earlier. Immunization is recommended for any person with an immunocompromised condition through the age of 49 years if he did not get any or all doses earlier. During the 3-dose series, the second dose should be obtained 4-8 weeks after the first  dose. The third dose should be obtained 24 weeks after the first dose and 16 weeks after the second dose.  Zoster vaccine. One dose is recommended for adults aged 50 years or older unless certain conditions are present.  Measles, mumps, and rubella (MMR) vaccine. Adults born before [birth year] generally are considered immune to measles and mumps. Adults born in [birth year] or later should have 1 or more doses of MMR vaccine unless there is a contraindication to the vaccine or there is laboratory evidence of immunity to each of the three diseases. A routine second dose of MMR vaccine should be obtained at least 28 days after the first dose for students attending postsecondary  schools, health care workers, or international travelers. People who received inactivated measles vaccine or an unknown type of measles vaccine during 1963-1967 should receive 2 doses of MMR vaccine. People who received inactivated mumps vaccine or an unknown type of mumps vaccine before 1979 and are at high risk for mumps infection should consider immunization with 2 doses of MMR vaccine. Unvaccinated health care workers born before 1957 who lack laboratory evidence of measles, mumps, or rubella immunity or laboratory confirmation of disease should consider measles and mumps immunization with 2 doses of MMR vaccine or rubella immunization with 1 dose of MMR vaccine.  Pneumococcal 13-valent conjugate (PCV13) vaccine. When indicated, a person who is uncertain of his immunization history and has no record of immunization should receive the PCV13 vaccine. An adult aged 19 years or older who has certain medical conditions and has not been previously immunized should receive 1 dose of PCV13 vaccine. This PCV13 should be followed with a dose of pneumococcal polysaccharide (PPSV23) vaccine. The PPSV23 vaccine dose should be obtained at least 8 weeks after the dose of PCV13 vaccine. An adult aged 19 years or older who has certain medical conditions and  previously received 1 or more doses of PPSV23 vaccine should receive 1 dose of PCV13. The PCV13 vaccine dose should be obtained 1 or more years after the last PPSV23 vaccine dose.  Pneumococcal polysaccharide (PPSV23) vaccine. When PCV13 is also indicated, PCV13 should be obtained first. All adults aged 65 years and older should be immunized. An adult younger than age 65 years who has certain medical conditions should be immunized. Any person who resides in a nursing home or long-term care facility should be immunized. An adult smoker should be immunized. People with an immunocompromised condition and certain other conditions should receive both PCV13 and PPSV23 vaccines. People with human immunodeficiency virus (HIV) infection should be immunized as soon as possible after diagnosis. Immunization during chemotherapy or radiation therapy should be avoided. Routine use of PPSV23 vaccine is not recommended for American Indians, [place] Natives, or people younger than 65 years unless there are medical conditions that require PPSV23 vaccine. When indicated, people who have unknown immunization and have no record of immunization should receive PPSV23 vaccine. One-time revaccination 5 years after the first dose of PPSV23 is recommended for people aged 19-64 years who have chronic kidney failure, nephrotic syndrome, asplenia, or immunocompromised conditions. People who received 1-2 doses of PPSV23 before age 65 years should receive another dose of PPSV23 vaccine at age 65 years or later if at least 5 years have passed since the previous dose. Doses of PPSV23 are not needed for people immunized with PPSV23 at or after age 65 years.  Meningococcal vaccine. Adults with asplenia or persistent complement component deficiencies should receive 2 doses of quadrivalent meningococcal conjugate (MenACWY-D) vaccine. The doses should be obtained at least 2 months apart. Microbiologists working with certain meningococcal bacteria,  military recruits, people at risk during an outbreak, and people who travel to or live in countries with a high rate of meningitis should be immunized. A first-year college student up through age 21 years who is living in a residence hall should receive a dose if he did not receive a dose on or after his 16th birthday. Adults who have certain high-risk conditions should receive one or more doses of vaccine.  Hepatitis A vaccine. Adults who wish to be protected from this disease, have certain high-risk conditions, work with hepatitis A-infected animals, work in hepatitis A research labs, or   travel to or work in countries with a high rate of hepatitis A should be immunized. Adults who were previously unvaccinated and who anticipate close contact with an international adoptee during the first 60 days after arrival in the [place] from a country with a high rate of hepatitis A should be immunized.  Hepatitis B vaccine. Adults should be immunized if they wish to be protected from this disease, have certain high-risk conditions, may be exposed to blood or other infectious body fluids, are household contacts or sex partners of hepatitis B positive people, are clients or workers in certain care facilities, or travel to or work in countries with a high rate of hepatitis B.  Haemophilus influenzae type b (Hib) vaccine. A previously unvaccinated person with asplenia or sickle cell disease or having a scheduled splenectomy should receive 1 dose of Hib vaccine. Regardless of previous immunization, a recipient of a hematopoietic stem cell transplant should receive a 3-dose series 6-12 months after his successful transplant. Hib vaccine is not recommended for adults with HIV infection. Preventive Service / Frequency Ages 52 to 17  Blood pressure check.** / Every 1 to 2 years.  Lipid and cholesterol check.** / Every 5 years beginning at age 69.  Hepatitis C blood test.** / For any individual with known risks for  hepatitis C.  Skin self-exam. / Monthly.  Influenza vaccine. / Every year.  Tetanus, diphtheria, and acellular pertussis (Tdap, Td) vaccine.** / Consult your health care provider. 1 dose of Td every 10 years.  Varicella vaccine.** / Consult your health care provider.  HPV vaccine. / 3 doses over 6 months, if 72 or younger.  Measles, mumps, rubella (MMR) vaccine.** / You need at least 1 dose of MMR if you were born in 1957 or later. You may also need a second dose.  Pneumococcal 13-valent conjugate (PCV13) vaccine.** / Consult your health care provider.  Pneumococcal polysaccharide (PPSV23) vaccine.** / 1 to 2 doses if you smoke cigarettes or if you have certain conditions.  Meningococcal vaccine.** / 1 dose if you are age 35 to 60 years and a Market researcher living in a residence hall, or have one of several medical conditions. You may also need additional booster doses.  Hepatitis A vaccine.** / Consult your health care provider.  Hepatitis B vaccine.** / Consult your health care provider.  Haemophilus influenzae type b (Hib) vaccine.** / Consult your health care provider. Ages 35 to 8  Blood pressure check.** / Every 1 to 2 years.  Lipid and cholesterol check.** / Every 5 years beginning at age 57.  Lung cancer screening. / Every year if you are aged 44-80 years and have a 30-pack-year history of smoking and currently smoke or have quit within the past 15 years. Yearly screening is stopped once you have quit smoking for at least 15 years or develop a health problem that would prevent you from having lung cancer treatment.  Fecal occult blood test (FOBT) of stool. / Every year beginning at age 55 and continuing until age 73. You may not have to do this test if you get a colonoscopy every 10 years.  Flexible sigmoidoscopy** or colonoscopy.** / Every 5 years for a flexible sigmoidoscopy or every 10 years for a colonoscopy beginning at age 28 and continuing until age  1.  Hepatitis C blood test.** / For all people born from [birth year] through 1965 and any individual with known risks for hepatitis C.  Skin self-exam. / Monthly.  Influenza vaccine. / Every  year.  Tetanus, diphtheria, and acellular pertussis (Tdap/Td) vaccine.** / Consult your health care provider. 1 dose of Td every 10 years.  Varicella vaccine.** / Consult your health care provider.  Zoster vaccine.** / 1 dose for adults aged 53 years or older.  Measles, mumps, rubella (MMR) vaccine.** / You need at least 1 dose of MMR if you were born in 1957 or later. You may also need a second dose.  Pneumococcal 13-valent conjugate (PCV13) vaccine.** / Consult your health care provider.  Pneumococcal polysaccharide (PPSV23) vaccine.** / 1 to 2 doses if you smoke cigarettes or if you have certain conditions.  Meningococcal vaccine.** / Consult your health care provider.  Hepatitis A vaccine.** / Consult your health care provider.  Hepatitis B vaccine.** / Consult your health care provider.  Haemophilus influenzae type b (Hib) vaccine.** / Consult your health care provider. Ages 77 and over  Blood pressure check.** / Every 1 to 2 years.  Lipid and cholesterol check.**/ Every 5 years beginning at age 85.  Lung cancer screening. / Every year if you are aged 55-80 years and have a 30-pack-year history of smoking and currently smoke or have quit within the past 15 years. Yearly screening is stopped once you have quit smoking for at least 15 years or develop a health problem that would prevent you from having lung cancer treatment.  Fecal occult blood test (FOBT) of stool. / Every year beginning at age 33 and continuing until age 11. You may not have to do this test if you get a colonoscopy every 10 years.  Flexible sigmoidoscopy** or colonoscopy.** / Every 5 years for a flexible sigmoidoscopy or every 10 years for a colonoscopy beginning at age 28 and continuing until age 73.  Hepatitis C blood  test.** / For all people born from [birth year] through 1965 and any individual with known risks for hepatitis C.  Abdominal aortic aneurysm (AAA) screening.** / A one-time screening for ages 50 to 27 years who are current or former smokers.  Skin self-exam. / Monthly.  Influenza vaccine. / Every year.  Tetanus, diphtheria, and acellular pertussis (Tdap/Td) vaccine.** / 1 dose of Td every 10 years.  Varicella vaccine.** / Consult your health care provider.  Zoster vaccine.** / 1 dose for adults aged 34 years or older.  Pneumococcal 13-valent conjugate (PCV13) vaccine.** / Consult your health care provider.  Pneumococcal polysaccharide (PPSV23) vaccine.** / 1 dose for all adults aged 63 years and older.  Meningococcal vaccine.** / Consult your health care provider.  Hepatitis A vaccine.** / Consult your health care provider.  Hepatitis B vaccine.** / Consult your health care provider.  Haemophilus influenzae type b (Hib) vaccine.** / Consult your health care provider. **Family history and personal history of risk and conditions may change your health care provider's recommendations. Document Released: 12/04/2001 Document Revised: 10/13/2013 Document Reviewed: 03/05/2011 [facility] Patient Information 2015 [place], [place]. This information is not intended to replace advice given to you by your health care provider. Make sure you discuss any questions you have with your health care provider.

## 2014-09-20 NOTE — Progress Notes (Signed)
Subjective:    Devin Schmidt is a 73 y.o. male who presents for Medicare Annual/Subsequent preventive examination.   Preventive Screening-Counseling & Management  Tobacco History  Smoking status  . Former Smoker -- 1.00 packs/day for 50 years  . Types: Cigarettes  . Quit date: 08/14/2009  Smokeless tobacco  . Never Used    Problems Prior to Visit 1. None new  Current Problems (verified) Patient Active Problem List   Diagnosis Date Noted  . Personal history of colonic polyps-adenomas 04/15/2014  . Unspecified constipation 12/24/2013  . Hemorrhoids 12/24/2013  . Essential tremor 11/20/2012  . Unspecified hereditary and idiopathic peripheral neuropathy 11/20/2012  . Tremor, essential 04/30/2011  . LYMPHOPENIA 12/08/2010  . 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 SEBORRHEIC KERATOSIS, INFLAMED 08/15/2007    Medications Prior to Visit Current Outpatient Prescriptions on File Prior to Visit  Medication Sig Dispense Refill  . primidone (MYSOLINE) 50 MG tablet Take 1 tablet (50 mg total) by mouth daily. 90 tablet 1   No current facility-administered medications on file prior to visit.    Current Medications (verified) Current Outpatient Prescriptions  Medication Sig Dispense Refill  . primidone (MYSOLINE) 50 MG tablet Take 1 tablet (50 mg total) by mouth daily. 90 tablet 1   No current facility-administered medications for this visit.     Allergies (verified) Review of patient's allergies indicates no known allergies.   PAST HISTORY  Family History Family History  Problem Relation Age of Onset  . Colon cancer Neg Hx   . Pancreatic cancer Neg Hx   . Rectal cancer Neg Hx   . Stomach cancer Neg Hx     Social History History  Substance Use Topics  . Smoking status: Former Smoker -- 1.00 packs/day for 50 years    Types: Cigarettes    Quit date: 08/14/2009  . Smokeless tobacco: Never Used  . Alcohol Use: 1.0 oz/week    2 drink(s) per week     Comment:  rare (one time per month)    Are there smokers in your home (other than you)?  No  Risk Factors Current exercise habits: walking daily  Dietary issues discussed: na   Cardiac risk factors: advanced age (older than 45 for men, 44 for women) and male gender.  Depression Screen (Note: if answer to either of the following is "Yes", a more complete depression screening is indicated)   Q1: Over the past two weeks, have you felt down, depressed or hopeless? No  Q2: Over the past two weeks, have you felt little interest or pleasure in doing things? No  Have you lost interest or pleasure in daily life? No  Do you often feel hopeless? No  Do you cry easily over simple problems? No  Activities of Daily Living In your present state of health, do you have any difficulty performing the following activities?:  Driving? No Managing money?  No Feeding yourself? No Getting from bed to chair? No Climbing a flight of stairs? No Preparing food and eating?: No Bathing or showering? No Getting dressed: No Getting to the toilet? No Using the toilet:No Moving around from place to place: No In the past year have you fallen or had a near fall?:No   Are you sexually active?  Yes  Do you have more than one partner?  No  Hearing Difficulties: No Do you often ask people to speak up or repeat themselves? No Do you experience ringing or noises in your ears? No Do you have difficulty understanding soft  or whispered voices? No   Do you feel that you have a problem with memory? No  Do you often misplace items? No  Do you feel safe at home?  Yes  Cognitive Testing  Alert? Yes  Normal Appearance?Yes  Oriented to person? Yes  Place? Yes   Time? Yes  Recall of three objects?  Yes  Can perform simple calculations? Yes  Displays appropriate judgment?Yes  Can read the correct time from a watch face?Yes   Advanced Directives have been discussed with the patient? Yes   List the Names of Other  Physician/Practitioners you currently use: 1.  Dentist-dental works   Indicate any recent [employer] you may have received from other than [facility] providers in the past year (date may be approximate).  There is no immunization history for the selected administration types on file for this patient.  Screening Tests Health Maintenance  Topic Date Due  . INFLUENZA VACCINE  09/21/2015 (Originally 05/22/2014)  . COLONOSCOPY  04/15/2017  . TETANUS/TDAP  08/14/2021  . PNEUMOCOCCAL POLYSACCHARIDE VACCINE AGE 26 AND OVER  Addressed  . ZOSTAVAX  Addressed    All answers were reviewed with the patient and necessary referrals were made:  Garnet Koyanagi, DO   09/20/2014   History reviewed:  He  has a past medical history of URI (upper respiratory infection); Inflamed seborrheic keratosis; Hemochromatosis; and Colon polyps. He  does not have any pertinent problems on file. He  has past surgical history that includes Appendectomy; Tonsillectomy; Colonoscopy w/ biopsies (10/20/2008); and Hemorrhoid surgery. His family history is negative for Colon cancer, Pancreatic cancer, Rectal cancer, and Stomach cancer. He  reports that he quit smoking about 5 years ago. His smoking use included Cigarettes. He has a 50 pack-year smoking history. He has never used smokeless tobacco. He reports that he drinks about 1.0 oz of alcohol per week. He reports that he does not use illicit drugs. He has a current medication list which includes the following prescription(s): primidone. Current Outpatient Prescriptions on File Prior to Visit  Medication Sig Dispense Refill  . primidone (MYSOLINE) 50 MG tablet Take 1 tablet (50 mg total) by mouth daily. 90 tablet 1   No current facility-administered medications on file prior to visit.   He has No Known Allergies.  Review of Systems  Review of Systems  Constitutional: Negative for activity change, appetite change and fatigue.  HENT: Negative for hearing loss,  congestion, tinnitus and ear discharge.   Eyes: Negative for visual disturbance (see optho q1y -- vision corrected to 20/20 with glasses).  Respiratory: Negative for cough, chest tightness and shortness of breath.   Cardiovascular: Negative for chest pain, palpitations and leg swelling.  Gastrointestinal: Negative for abdominal pain, diarrhea, constipation and abdominal distention.  Genitourinary: Negative for urgency, frequency, decreased urine volume and difficulty urinating.  Musculoskeletal: Negative for back pain, arthralgias and gait problem.  Skin: Negative for color change, pallor and rash.  Neurological: Negative for dizziness, light-headedness, numbness and headaches.  Hematological: Negative for adenopathy. Does not bruise/bleed easily.  Psychiatric/Behavioral: Negative for suicidal ideas, confusion, sleep disturbance, self-injury, dysphoric mood, decreased concentration and agitation.  Pt is able to read and write and can do all ADLs No risk for falling No abuse/ violence in home      Objective:     Vision by Snellen chart: opth Blood pressure 102/62, pulse 61, temperature 97.8 F (36.6 C), temperature source Oral, height 6' (1.829 m), weight 187 lb 9.6 oz (85.095 kg), SpO2 98 %.  Body mass index is 25.44 kg/(m^2).  BP 102/62 mmHg  Pulse 61  Temp(Src) 97.8 F (36.6 C) (Oral)  Ht 6' (1.829 m)  Wt 187 lb 9.6 oz (85.095 kg)  BMI 25.44 kg/m2  SpO2 98% General appearance: alert, cooperative, appears stated age and no distress Head: Normocephalic, without obvious abnormality, atraumatic Eyes: conjunctivae/corneas clear. PERRL, EOM's intact. Fundi benign. Ears: normal TM's and external ear canals both ears Nose: Nares normal. Septum midline. Mucosa normal. No drainage or sinus tenderness. Throat: lips, mucosa, and tongue normal; teeth and gums normal Neck: no adenopathy, no carotid bruit, no JVD, supple, symmetrical, trachea midline and thyroid not enlarged, symmetric, no  tenderness/mass/nodules Back: symmetric, no curvature. ROM normal. No CVA tenderness. Lungs: clear to auscultation bilaterally Chest wall: no tenderness Heart: regular rate and rhythm, S1, S2 normal, no murmur, click, rub or gallop Abdomen: soft, non-tender; bowel sounds normal; no masses,  no organomegaly Male genitalia: normal Rectal: + enlarged prostate, nontender Extremities: extremities normal, atraumatic, no cyanosis or edema Pulses: 2+ and symmetric Skin: Skin color, texture, turgor normal. No rashes or lesions Lymph nodes: Cervical, supraclavicular, and axillary nodes normal. Neurologic: Alert and oriented X 3, normal strength and tone. Normal symmetric reflexes. Normal coordination and gait psych- no depression, no anxiety    Assessment:     cpe      Plan:     During the course of the visit the patient was educated and counseled about appropriate screening and preventive services including:    Glaucoma screening  Advanced directives: has no advanced directive-- will look into going to [facility] at [facility]   Pt refuses all immunizations  Pt to see opth  Diet review for nutrition referral? Yes ____  Not Indicated __x__   Patient Instructions (the written plan) was given to the patient.  Medicare Attestation I have personally reviewed: The patient's medical and social history Their use of alcohol, tobacco or illicit drugs Their current medications and supplements The patient's functional ability including ADLs,fall risks, home safety risks, cognitive, and hearing and visual impairment Diet and physical activities Evidence for depression or mood disorders  The patient's weight, height, BMI, and visual acuity have been recorded in the chart.  I have made referrals, counseling, and provided education to the patient based on review of the above and I have provided the patient with a written personalized care plan for preventive services.    1. Urinary frequency  - POCT  urinalysis dipstick - PSA  2. Ischemic heart disease screen   - Basic metabolic panel - CBC with Differential - Hepatic function panel - Lipid panel   Garnet Koyanagi, DO   09/20/2014

## 2014-09-20 NOTE — Progress Notes (Signed)
Pre visit review using our clinic review tool, if applicable. No additional management support is needed unless otherwise documented below in the visit note. 

## 2014-09-22 ENCOUNTER — Other Ambulatory Visit (INDEPENDENT_AMBULATORY_CARE_PROVIDER_SITE_OTHER): Payer: Medicare Other

## 2014-09-22 LAB — CBC
HCT: 47.7 % (ref 39.0–52.0)
Hemoglobin: 16.2 g/dL (ref 13.0–17.0)
MCHC: 34 g/dL (ref 30.0–36.0)
MCV: 98 fl (ref 78.0–100.0)
Platelets: 181 10*3/uL (ref 150.0–400.0)
RBC: 4.87 Mil/uL (ref 4.22–5.81)
RDW: 13.1 % (ref 11.5–15.5)
WBC: 6.1 10*3/uL (ref 4.0–10.5)

## 2014-09-23 NOTE — Progress Notes (Signed)
Quick Note:  Continue every other month phlebotomy ______

## 2014-11-15 ENCOUNTER — Ambulatory Visit (INDEPENDENT_AMBULATORY_CARE_PROVIDER_SITE_OTHER): Payer: Medicare Other | Admitting: Family

## 2014-11-15 ENCOUNTER — Encounter: Payer: Self-pay | Admitting: Family

## 2014-11-15 ENCOUNTER — Ambulatory Visit (HOSPITAL_BASED_OUTPATIENT_CLINIC_OR_DEPARTMENT_OTHER)
Admission: RE | Admit: 2014-11-15 | Discharge: 2014-11-15 | Disposition: A | Discharge status: Home / Self Care | Payer: Medicare Other | Source: Ambulatory Visit | Attending: Family | Admitting: Family

## 2014-11-15 VITALS — BP 124/78 | HR 80 | Temp 98.4°F | Resp 18 | Ht 72.0 in | Wt 184.8 lb

## 2014-11-15 DIAGNOSIS — R062 Wheezing: Secondary | ICD-10-CM

## 2014-11-15 DIAGNOSIS — J209 Acute bronchitis, unspecified: Secondary | ICD-10-CM

## 2014-11-15 DIAGNOSIS — Z87891 Personal history of nicotine dependence: Secondary | ICD-10-CM | POA: Insufficient documentation

## 2014-11-15 DIAGNOSIS — R05 Cough: Secondary | ICD-10-CM | POA: Diagnosis not present

## 2014-11-15 DIAGNOSIS — J449 Chronic obstructive pulmonary disease, unspecified: Secondary | ICD-10-CM | POA: Diagnosis not present

## 2014-11-15 DIAGNOSIS — J40 Bronchitis, not specified as acute or chronic: Secondary | ICD-10-CM

## 2014-11-15 MED ORDER — ALBUTEROL SULFATE HFA 108 (90 BASE) MCG/ACT IN AERS: 2.0000 | INHALATION_SPRAY | Freq: Four times a day (QID) | RESPIRATORY_TRACT | Status: DC | PRN

## 2014-11-15 MED ORDER — PREDNISONE 10 MG PO TABS: ORAL_TABLET | ORAL | Status: DC

## 2014-11-15 MED ORDER — ALBUTEROL SULFATE (2.5 MG/3ML) 0.083% IN NEBU
2.5000 mg | INHALATION_SOLUTION | Freq: Once | RESPIRATORY_TRACT | Status: AC
  Administered 2014-11-15: 2.5 mg via RESPIRATORY_TRACT

## 2014-11-15 MED ORDER — AZITHROMYCIN 250 MG PO TABS: ORAL_TABLET | ORAL | Status: DC

## 2014-11-15 NOTE — Progress Notes (Signed)
Pre visit review using our clinic review tool, if applicable. No additional management support is needed unless otherwise documented below in the visit note. 

## 2014-11-15 NOTE — Patient Instructions (Signed)
Start prednisone taper, azithromycin (antibiotic) and albuterol inhaler. Complete chest x ray on the first floor. Follow up in 1 week.

## 2014-11-15 NOTE — Progress Notes (Signed)
   Subjective:    Patient ID: Devin Schmidt, male    DOB: [birth year], 74 y.o.   MRN: [id number]  HPI  Devin Schmidt is a 74 yr old male who presents today with chief complaint of cough. Reports that he has been spending a lot of time at a nursing rehab center visiting his girlfriend who had TKA.  Reports cough/contestion began 3 days ago.  Reports that his lungs feel tight. He is a former smoker, quit 2-3 years ago. He denies associated fever.  He notes that he develops some mild shortness of breath with exertion the last few days  Review of Systems See HPI  Past Medical History  Diagnosis Date  . URI (upper respiratory infection)   . Inflamed seborrheic keratosis   . Hemochromatosis     HX OF  . Colon polyps     Tubular Adenoma and Hyperplastic     History   Social History  . Marital Status: Single    Spouse Name: N/A    Number of Children: 1  . Years of Education: N/A   Occupational History  . RETIRED    Social History Main Topics  . Smoking status: Former Smoker -- 1.00 packs/day for 50 years    Types: Cigarettes    Quit date: 08/14/2009  . Smokeless tobacco: Never Used  . Alcohol Use: 1.0 oz/week    2 drink(s) per week     Comment: rare (one time per month)  . Drug Use: No  . Sexual Activity:    Partners: Female   Other Topics Concern  . Not on file   Social History Narrative   Exercise-- , except golf, swims     Past Surgical History  Procedure Laterality Date  . Appendectomy    . Tonsillectomy    . Colonoscopy w/ biopsies  10/20/2008  . Hemorrhoid surgery      Family History  Problem Relation Age of Onset  . Colon cancer Neg Hx   . Pancreatic cancer Neg Hx   . Rectal cancer Neg Hx   . Stomach cancer Neg Hx     No Known Allergies  Current Outpatient Prescriptions on File Prior to Visit  Medication Sig Dispense Refill  . primidone (MYSOLINE) 50 MG tablet Take 1 tablet (50 mg total) by mouth daily. 90 tablet 1   No current facility-administered  medications on file prior to visit.    BP 124/78 mmHg  Pulse 80  Temp(Src) 98.4 F (36.9 C) (Oral)  Resp 18  Ht 6' (1.829 m)  Wt 184 lb 12.8 oz (83.825 kg)  BMI 25.06 kg/m2  SpO2 98%       Objective:   Physical Exam  Constitutional: He is oriented to person, place, and time. He appears well-developed and well-nourished. No distress.  HENT:  Head: Normocephalic and atraumatic.  Right Ear: Tympanic membrane and ear canal normal.  Left Ear: Tympanic membrane and ear canal normal.  Mouth/Throat: No oropharyngeal exudate or posterior oropharyngeal edema.  Cardiovascular: Normal rate, regular rhythm and normal heart sounds.   No murmur heard. Pulmonary/Chest: Effort normal and breath sounds normal. No respiratory distress.  Bilateral wheezes, scattered crackles.  R>L  Lymphadenopathy:    He has no cervical adenopathy.  Neurological: He is alert and oriented to person, place, and time.          Assessment & Plan:

## 2014-11-16 DIAGNOSIS — J209 Acute bronchitis, unspecified: Secondary | ICD-10-CM | POA: Insufficient documentation

## 2014-11-16 NOTE — Assessment & Plan Note (Signed)
CXR is negative for pneumonia.  However, will plan empiric abx for bronchitis given presumed copd (long smoking hx) and active wheezing. Add albuterol MDI, pred taper.  Follow up in 1 week, sooner if problems/concerns.

## 2014-11-22 ENCOUNTER — Ambulatory Visit (INDEPENDENT_AMBULATORY_CARE_PROVIDER_SITE_OTHER): Payer: Medicare Other | Admitting: Family

## 2014-11-22 ENCOUNTER — Encounter: Payer: Self-pay | Admitting: Family

## 2014-11-22 VITALS — BP 110/70 | HR 64 | Temp 97.8°F | Resp 16 | Ht 72.0 in | Wt 185.0 lb

## 2014-11-22 DIAGNOSIS — J40 Bronchitis, not specified as acute or chronic: Secondary | ICD-10-CM | POA: Diagnosis not present

## 2014-11-22 DIAGNOSIS — J209 Acute bronchitis, unspecified: Secondary | ICD-10-CM

## 2014-11-22 MED ORDER — PREDNISONE 10 MG PO TABS: ORAL_TABLET | ORAL | Status: DC

## 2014-11-22 MED ORDER — BUDESONIDE-FORMOTEROL FUMARATE 160-4.5 MCG/ACT IN AERO: 2.0000 | INHALATION_SPRAY | Freq: Two times a day (BID) | RESPIRATORY_TRACT | Status: DC

## 2014-11-22 NOTE — Assessment & Plan Note (Addendum)
Some improvement but due to persistent wheezing, will extend pred 10mg  once daily for 3 more days and add symbicort. Follow up with Dr. Etter Sjogren in 1 month. Could consider PFT's at that time to further assess for possible underlying COPD. Start claritin 10mg  once daily as needed for nasal congestion. Call if symptoms worsen or if symptoms do not continue to improve.

## 2014-11-22 NOTE — Progress Notes (Signed)
Pre visit review using our clinic review tool, if applicable. No additional management support is needed unless otherwise documented below in the visit note. 

## 2014-11-22 NOTE — Patient Instructions (Addendum)
Continue prednisone 10mg  once daily for 3 more days. Continue albuterol every 6 hours for next few days. Start symbicort. Start claritin 10mg  once daily as needed for nasal congestion. Call if symptoms worsen or if symptoms do not continue to improve. Follow up with Dr. Etter Sjogren in 1 month.

## 2014-11-22 NOTE — Progress Notes (Signed)
Subjective:    Patient ID: Devin Schmidt, male    DOB: [birth year], 74 y.o.   MRN: [id number]  HPI  Patient here for follow up of bronchitis.  Last week he presented with complaint of cough.  He had significant wheezing. CXR was negative for pneumonia.  He was placed on zpak, prednisone taper, and albuterol. Reports that his breathing is better.  Feels "like lungs have opened." Cough is much better.  Has some mild clear nasal discharge/ post nasal drip.  and clear phlegm.    Past Medical History  Diagnosis Date  . URI (upper respiratory infection)   . Inflamed seborrheic keratosis   . Hemochromatosis     HX OF  . Colon polyps     Tubular Adenoma and Hyperplastic     Review of Systems    see HPI  Past Medical History  Diagnosis Date  . URI (upper respiratory infection)   . Inflamed seborrheic keratosis   . Hemochromatosis     HX OF  . Colon polyps     Tubular Adenoma and Hyperplastic     History   Social History  . Marital Status: Single    Spouse Name: N/A    Number of Children: 1  . Years of Education: N/A   Occupational History  . RETIRED    Social History Main Topics  . Smoking status: Former Smoker -- 1.00 packs/day for 50 years    Types: Cigarettes    Quit date: 08/14/2009  . Smokeless tobacco: Never Used  . Alcohol Use: 1.0 oz/week    2 drink(s) per week     Comment: rare (one time per month)  . Drug Use: No  . Sexual Activity:    Partners: Female   Other Topics Concern  . Not on file   Social History Narrative   Exercise-- , except golf, swims     Past Surgical History  Procedure Laterality Date  . Appendectomy    . Tonsillectomy    . Colonoscopy w/ biopsies  10/20/2008  . Hemorrhoid surgery      Family History  Problem Relation Age of Onset  . Colon cancer Neg Hx   . Pancreatic cancer Neg Hx   . Rectal cancer Neg Hx   . Stomach cancer Neg Hx     No Known Allergies  Current Outpatient Prescriptions on File Prior to Visit    Medication Sig Dispense Refill  . albuterol (PROVENTIL HFA;VENTOLIN HFA) 108 (90 BASE) MCG/ACT inhaler Inhale 2 puffs into the lungs every 6 (six) hours as needed for wheezing or shortness of breath. 1 Inhaler 0  . predniSONE (DELTASONE) 10 MG tablet 4 tabs by mouth once daily x 2 days, then 3 tabs daily x 2 days, then 2 tabs daily x 2 days, then 1 tab daily x 2 days the stop 20 tablet 0  . primidone (MYSOLINE) 50 MG tablet Take 1 tablet (50 mg total) by mouth daily. 90 tablet 1   No current facility-administered medications on file prior to visit.    BP 110/70 mmHg  Pulse 64  Temp(Src) 97.8 F (36.6 C) (Oral)  Resp 16  Ht 6' (1.829 m)  Wt 185 lb (83.915 kg)  BMI 25.08 kg/m2  SpO2 96%    Objective:    Physical Exam  Constitutional: He is oriented to person, place, and time. He appears well-developed and well-nourished. No distress.  HENT:  Head: Normocephalic and atraumatic.  Cardiovascular: Normal rate and regular rhythm.  No murmur heard. Pulmonary/Chest: Effort normal. No respiratory distress.  Bilateral expiratory wheeze- improved since last week  Musculoskeletal: He exhibits no edema.  Neurological: He is alert and oriented to person, place, and time.  Skin: Skin is warm and dry.  Psychiatric: He has a normal mood and affect. His behavior is normal. Thought content normal.    BP 110/70 mmHg  Pulse 64  Temp(Src) 97.8 F (36.6 C) (Oral)  Resp 16  Ht 6' (1.829 m)  Wt 185 lb (83.915 kg)  BMI 25.08 kg/m2  SpO2 96% Wt Readings from Last 3 Encounters:  11/22/14 185 lb (83.915 kg)  11/15/14 184 lb 12.8 oz (83.825 kg)  09/20/14 187 lb 9.6 oz (85.095 kg)     Lab Results  Component Value Date   WBC 6.1 09/22/2014   HGB 16.2 09/22/2014   HCT 47.7 09/22/2014   PLT 181.0 09/22/2014   GLUCOSE 88 09/20/2014   CHOL 197 09/20/2014   TRIG 186.0* 09/20/2014   HDL 45.80 09/20/2014   LDLCALC 114* 09/20/2014   ALT 15 09/20/2014   AST 16 09/20/2014   NA 138 09/20/2014    K 4.0 09/20/2014   CL 101 09/20/2014   CREATININE 1.2 09/20/2014   BUN 16 09/20/2014   CO2 27 09/20/2014   TSH 0.60 11/20/2012   PSA 1.25 09/20/2014    Dg Chest 2 View  11/16/2014   CLINICAL DATA:  Cough for 3 days without relief, 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  Emphysematous and bronchitic changes consistent with COPD.  No acute infiltrate, pleural effusion or pneumothorax.  Scattered endplate spur formation thoracic spine without acute osseous findings.  IMPRESSION: COPD changes.  No acute abnormalities.   Electronically Signed   By: Lavonia Dana M.D.   On: 11/16/2014 08:59       Assessment & Plan:   Problem List Items Addressed This Visit    None       Nance Pear., NP

## 2014-11-23 ENCOUNTER — Other Ambulatory Visit (INDEPENDENT_AMBULATORY_CARE_PROVIDER_SITE_OTHER): Payer: Medicare Other

## 2014-11-23 LAB — CBC
HCT: 45.9 % (ref 39.0–52.0)
HEMOGLOBIN: 15.7 g/dL (ref 13.0–17.0)
MCHC: 34.2 g/dL (ref 30.0–36.0)
MCV: 97.5 fl (ref 78.0–100.0)
Platelets: 239 10*3/uL (ref 150.0–400.0)
RBC: 4.7 Mil/uL (ref 4.22–5.81)
RDW: 13.4 % (ref 11.5–15.5)
WBC: 10.3 10*3/uL (ref 4.0–10.5)

## 2014-11-24 ENCOUNTER — Other Ambulatory Visit: Payer: Self-pay

## 2014-11-24 NOTE — Progress Notes (Signed)
Quick Note:  Continue every other month phlebotomy Check ferritin next time ______

## 2014-12-03 ENCOUNTER — Telehealth: Payer: Self-pay | Admitting: Family Medicine

## 2014-12-03 MED ORDER — PRIMIDONE 50 MG PO TABS
50.0000 mg | ORAL_TABLET | Freq: Every day | ORAL | Status: DC
Start: 2014-12-03 — End: 2015-06-08

## 2014-12-03 NOTE — Telephone Encounter (Signed)
Rx faxed.    KP 

## 2014-12-03 NOTE — Telephone Encounter (Signed)
Caller name: Codie Relation to pt: self Call back number: [phone] Pharmacy: [facility] on [address]  Reason for call:   Requesting 90 day supply of primidine

## 2014-12-22 ENCOUNTER — Ambulatory Visit (INDEPENDENT_AMBULATORY_CARE_PROVIDER_SITE_OTHER): Payer: Medicare Other | Admitting: Medical

## 2014-12-22 ENCOUNTER — Encounter: Payer: Self-pay | Admitting: Medical

## 2014-12-22 VITALS — BP 124/76 | HR 81 | Temp 98.3°F | Ht 72.0 in | Wt 190.2 lb

## 2014-12-22 DIAGNOSIS — M545 Low back pain: Secondary | ICD-10-CM

## 2014-12-22 DIAGNOSIS — M549 Dorsalgia, unspecified: Secondary | ICD-10-CM | POA: Insufficient documentation

## 2014-12-22 MED ORDER — CYCLOBENZAPRINE HCL 5 MG PO TABS: 5.0000 mg | ORAL_TABLET | Freq: Every day | ORAL | Status: DC

## 2014-12-22 MED ORDER — TRAMADOL HCL 50 MG PO TABS: 50.0000 mg | ORAL_TABLET | Freq: Four times a day (QID) | ORAL | Status: DC | PRN

## 2014-12-22 MED ORDER — DICLOFENAC SODIUM 75 MG PO TBEC: 75.0000 mg | DELAYED_RELEASE_TABLET | Freq: Two times a day (BID) | ORAL | Status: DC

## 2014-12-22 NOTE — Assessment & Plan Note (Addendum)
Back stretching exercises. Rx diclofenac for infammation, tramadol for pain, and flexeril low dose at night.  If back pain persists for more than a week then would recommend xray lumbar spine.

## 2014-12-22 NOTE — Progress Notes (Signed)
Subjective:    Patient ID: Devin Schmidt, male    DOB: 1941-04-09, 74 y.o.   MRN: [id number]  HPI   Pt in states some back pain since yesterday. After walking he sat down. But when he got up he felt the pain. Pain mostly in the lower back. Minimal movements worsen the pain. Only walked one mile. Hx of occasional back pain on and off in the past. But rare and transient. Usually on stretches back and will resolve. With certain movement pain level 9/10. At rest no movement pain level 5-6/10.   No pain shooting to leg. No leg weakness. No urinary issues.     Review of Systems  Constitutional: Negative for fever, chills and fatigue.  Respiratory: Negative for cough, chest tightness and wheezing.   Cardiovascular: Negative for chest pain and palpitations.  Gastrointestinal: Negative for nausea, vomiting and abdominal pain.  Genitourinary: Negative for dysuria, urgency, hematuria and flank pain.  Musculoskeletal: Positive for back pain.  Neurological: Negative for weakness and numbness.   Past Medical History  Diagnosis Date  . URI (upper respiratory infection)   . Inflamed seborrheic keratosis   . Hemochromatosis     HX OF  . Colon polyps     Tubular Adenoma and Hyperplastic     History   Social History  . Marital Status: Single    Spouse Name: N/A  . Number of Children: 1  . Years of Education: N/A   Occupational History  . RETIRED    Social History Main Topics  . Smoking status: Former Smoker -- 1.00 packs/day for 50 years    Types: Cigarettes    Quit date: 08/14/2009  . Smokeless tobacco: Never Used  . Alcohol Use: 1.0 oz/week    2 drink(s) per week     Comment: rare (one time per month)  . Drug Use: No  . Sexual Activity:    Partners: Female   Other Topics Concern  . Not on file   Social History Narrative   Exercise-- , except golf, swims     Past Surgical History  Procedure Laterality Date  . Appendectomy    . Tonsillectomy    . Colonoscopy w/  biopsies  10/20/2008  . Hemorrhoid surgery      Family History  Problem Relation Age of Onset  . Colon cancer Neg Hx   . Pancreatic cancer Neg Hx   . Rectal cancer Neg Hx   . Stomach cancer Neg Hx     No Known Allergies  Current Outpatient Prescriptions on File Prior to Visit  Medication Sig Dispense Refill  . primidone (MYSOLINE) 50 MG tablet Take 1 tablet (50 mg total) by mouth daily. 90 tablet 1  . albuterol (PROVENTIL HFA;VENTOLIN HFA) 108 (90 BASE) MCG/ACT inhaler Inhale 2 puffs into the lungs every 6 (six) hours as needed for wheezing or shortness of breath. (Patient not taking: Reported on 12/22/2014) 1 Inhaler 0  . budesonide-formoterol (SYMBICORT) 160-4.5 MCG/ACT inhaler Inhale 2 puffs into the lungs 2 (two) times daily. (Patient not taking: Reported on 12/22/2014) 1 Inhaler 0  . predniSONE (DELTASONE) 10 MG tablet One tab by mouth daily for 3 additional days. (Patient not taking: Reported on 12/22/2014) 20 tablet 0   No current facility-administered medications on file prior to visit.    BP 124/76 mmHg  Pulse 81  Temp(Src) 98.3 F (36.8 C) (Oral)  Ht 6' (1.829 m)  Wt 190 lb 3.2 oz (86.274 kg)  BMI 25.79 kg/m2  SpO2  98%      Objective:   Physical Exam  General Appearance- Not in acute distress.    Chest and Lung Exam Auscultation: Breath sounds:-Normal. Clear even and unlabored. Adventitious sounds:- No Adventitious sounds.  Cardiovascular Auscultation:Rythm - Regular, rate and rythm. Heart Sounds -Normal heart sounds.  Abdomen Inspection:-Inspection Normal.  Palpation/Perucssion: Palpation and Percussion of the abdomen reveal- Non Tender, No Rebound tenderness, No rigidity(Guarding) and No Palpable abdominal masses.  Liver:-Normal.  Spleen:- Normal.   Back  adjacent to spine(paraspinal region both sides some pain but no mid lumbar area) Pain on straight leg lift. Pain on lateral movements and flexion/extension of the spine.  Lower ext  neurologic  L5-S1 sensation intact bilaterally. Normal patellar reflexes bilaterally. No foot drop bilaterally.      Assessment & Plan:

## 2014-12-22 NOTE — Patient Instructions (Signed)
Back pain Back stretching exercises. Rx diclofenac for infammation, tramadol for pain, and flexeril low dose at night.  If back pain persists for more than a week then would recommend xray lumbar spine.     Followup 7-10 days or as needed.

## 2014-12-22 NOTE — Progress Notes (Signed)
Pre visit review using our clinic review tool, if applicable. No additional management support is needed unless otherwise documented below in the visit note. 

## 2015-01-03 ENCOUNTER — Ambulatory Visit (INDEPENDENT_AMBULATORY_CARE_PROVIDER_SITE_OTHER): Payer: Medicare Other | Admitting: Medical

## 2015-01-03 ENCOUNTER — Encounter: Payer: Self-pay | Admitting: Medical

## 2015-01-03 ENCOUNTER — Other Ambulatory Visit: Payer: Self-pay | Admitting: Medical

## 2015-01-03 ENCOUNTER — Ambulatory Visit (HOSPITAL_BASED_OUTPATIENT_CLINIC_OR_DEPARTMENT_OTHER)
Admission: RE | Admit: 2015-01-03 | Discharge: 2015-01-03 | Disposition: A | Discharge status: Home / Self Care | Payer: Medicare Other | Source: Ambulatory Visit | Attending: Medical | Admitting: Medical

## 2015-01-03 VITALS — BP 117/75 | HR 67 | Temp 98.1°F | Ht 72.0 in | Wt 184.8 lb

## 2015-01-03 DIAGNOSIS — R1030 Lower abdominal pain, unspecified: Secondary | ICD-10-CM | POA: Diagnosis not present

## 2015-01-03 DIAGNOSIS — N508 Other specified disorders of male genital organs: Secondary | ICD-10-CM | POA: Diagnosis not present

## 2015-01-03 DIAGNOSIS — N433 Hydrocele, unspecified: Secondary | ICD-10-CM | POA: Diagnosis not present

## 2015-01-03 DIAGNOSIS — N50811 Right testicular pain: Secondary | ICD-10-CM

## 2015-01-03 DIAGNOSIS — N503 Cyst of epididymis: Secondary | ICD-10-CM | POA: Diagnosis not present

## 2015-01-03 DIAGNOSIS — N442 Benign cyst of testis: Secondary | ICD-10-CM | POA: Diagnosis not present

## 2015-01-03 DIAGNOSIS — N451 Epididymitis: Secondary | ICD-10-CM

## 2015-01-03 DIAGNOSIS — R1031 Right lower quadrant pain: Secondary | ICD-10-CM | POA: Insufficient documentation

## 2015-01-03 HISTORY — DX: Epididymitis: N45.1

## 2015-01-03 MED ORDER — LEVOFLOXACIN 500 MG PO TABS: 500.0000 mg | ORAL_TABLET | Freq: Every day | ORAL | Status: DC

## 2015-01-03 NOTE — Progress Notes (Signed)
Subjective:    Patient ID: Devin Schmidt, male    DOB: 1940/11/24, 74 y.o.   MRN: [id number]  HPI   PT state his low back pain is better. After 3-4 days pain got better. See last report/note. But he has faint rt sided groin area pain. Some rt iliac area pain.(but pain most groin and in testicle rt side) When he leans to the right will fell pain rt iliac spine area and radiated to rt testicle.   Leaning to rt side pain increased. Lifting rt leg up has pain. Further he lifts leg up toward abdomen worse pain.    Review of Systems  Constitutional: Negative for fever, chills, diaphoresis, activity change and fatigue.  Respiratory: Negative for cough, chest tightness and shortness of breath.   Cardiovascular: Negative for chest pain, palpitations and leg swelling.  Gastrointestinal: Negative for nausea, vomiting and abdominal pain.  Genitourinary: Positive for testicular pain. Negative for dysuria, urgency, frequency, hematuria, flank pain, decreased urine volume, discharge, penile swelling and genital sores.       Rt groin pain. Occasional pain radiate up to rt superior iliac spine area.  Musculoskeletal: Negative for back pain, neck pain and neck stiffness.  Neurological: Negative for dizziness, tremors, seizures, syncope, facial asymmetry, speech difficulty, weakness, light-headedness, numbness and headaches.  Psychiatric/Behavioral: Negative for behavioral problems, confusion and agitation. The patient is not nervous/anxious.    Past Medical History  Diagnosis Date  . URI (upper respiratory infection)   . Inflamed seborrheic keratosis   . Hemochromatosis     HX OF  . Colon polyps     Tubular Adenoma and Hyperplastic     History   Social History  . Marital Status: Single    Spouse Name: N/A  . Number of Children: 1  . Years of Education: N/A   Occupational History  . RETIRED    Social History Main Topics  . Smoking status: Former Smoker -- 1.00 packs/day for 50 years   Types: Cigarettes    Quit date: 08/14/2009  . Smokeless tobacco: Never Used  . Alcohol Use: 1.0 oz/week    2 drink(s) per week     Comment: rare (one time per month)  . Drug Use: No  . Sexual Activity:    Partners: Female   Other Topics Concern  . Not on file   Social History Narrative   Exercise-- , except golf, swims     Past Surgical History  Procedure Laterality Date  . Appendectomy    . Tonsillectomy    . Colonoscopy w/ biopsies  10/20/2008  . Hemorrhoid surgery      Family History  Problem Relation Age of Onset  . Colon cancer Neg Hx   . Pancreatic cancer Neg Hx   . Rectal cancer Neg Hx   . Stomach cancer Neg Hx     No Known Allergies  Current Outpatient Prescriptions on File Prior to Visit  Medication Sig Dispense Refill  . albuterol (PROVENTIL HFA;VENTOLIN HFA) 108 (90 BASE) MCG/ACT inhaler Inhale 2 puffs into the lungs every 6 (six) hours as needed for wheezing or shortness of breath. 1 Inhaler 0  . budesonide-formoterol (SYMBICORT) 160-4.5 MCG/ACT inhaler Inhale 2 puffs into the lungs 2 (two) times daily. 1 Inhaler 0  . cyclobenzaprine (FLEXERIL) 5 MG tablet Take 1 tablet (5 mg total) by mouth at bedtime. 7 tablet 0  . diclofenac (VOLTAREN) 75 MG EC tablet Take 1 tablet (75 mg total) by mouth 2 (two) times daily. Mandan  tablet 0  . predniSONE (DELTASONE) 10 MG tablet One tab by mouth daily for 3 additional days. 20 tablet 0  . primidone (MYSOLINE) 50 MG tablet Take 1 tablet (50 mg total) by mouth daily. 90 tablet 1  . traMADol (ULTRAM) 50 MG tablet Take 1 tablet (50 mg total) by mouth every 6 (six) hours as needed for severe pain. 8 tablet 0   No current facility-administered medications on file prior to visit.    BP 117/75 mmHg  Pulse 67  Temp(Src) 98.1 F (36.7 C) (Oral)  Ht 6' (1.829 m)  Wt 184 lb 12.8 oz (83.825 kg)  BMI 25.06 kg/m2  SpO2 100%      Objective:   Physical Exam   General- No acute distress. Pleasant patient. Neck- Full range of  motion, no jvd Lungs- Clear, even and unlabored. Heart- regular rate and rhythm. Neurologic- CNII- XII grossly intact.  GU- glands penis nml, rt testicle tender epididymus area. Feels inflamed and large. Rt inguinal canal mild bulge on coughing. Lt testicle and lt inguinal canal normal.    Assessment & Plan:

## 2015-01-03 NOTE — Progress Notes (Signed)
Pre visit review using our clinic review tool, if applicable. No additional management support is needed unless otherwise documented below in the visit note. 

## 2015-01-03 NOTE — Assessment & Plan Note (Signed)
By exam the epididymus feels enflamed will get [name] of testicle today.

## 2015-01-03 NOTE — Patient Instructions (Addendum)
Epididymitis By exam the epididymus feels enflamed will get [name] of testicle today.   Right groin pain Possible inguinal hernia by exam.      Follow up 7-10 days or as needed.  Take levofloxin for likely epididymitis.   For associated pain take diclofenac.  If any acute severe groin pain or testicle pain then ED evaluation.  May refer to general surgeon or urologist(possible both) after [name] back.

## 2015-01-03 NOTE — Assessment & Plan Note (Signed)
Possible inguinal hernia by exam.

## 2015-01-04 ENCOUNTER — Telehealth: Payer: Self-pay | Admitting: Medical

## 2015-01-04 DIAGNOSIS — R1031 Right lower quadrant pain: Secondary | ICD-10-CM

## 2015-01-04 DIAGNOSIS — N50811 Right testicular pain: Secondary | ICD-10-CM

## 2015-01-04 NOTE — Telephone Encounter (Signed)
Will refer pt to urologist regarding very tender area left testicle epididymal cyst. Also refer to general surgeon for possible lt inguinal hernia.

## 2015-01-04 NOTE — Telephone Encounter (Signed)
Correction on referral to urologist.

## 2015-01-11 ENCOUNTER — Ambulatory Visit: Payer: Self-pay | Admitting: General Surgery

## 2015-01-11 DIAGNOSIS — K4090 Unilateral inguinal hernia, without obstruction or gangrene, not specified as recurrent: Secondary | ICD-10-CM | POA: Diagnosis not present

## 2015-01-12 ENCOUNTER — Telehealth: Payer: Self-pay | Admitting: Medical

## 2015-01-12 NOTE — Telephone Encounter (Signed)
Caller name: Wylan Gentzler Relation to QG:BEEF  Call back number: [phone] Pharmacy:  Reason for call: Pt came in office stating was seen by Mackie Pai for hernia and states already was seen by the urologist Dr Hulen Skains and is going to have surgery, pt mentioned was called again to see another urologist named Dr. Lowella Bandy, pt wants to know if it is neccesary to see Dr. Lowella Bandy before having surgery or he can canceled the appt.  Please advise. ASAP.

## 2015-01-14 NOTE — Telephone Encounter (Signed)
Call regarding specialist. Will get lpn to advise.

## 2015-01-17 ENCOUNTER — Telehealth: Payer: Self-pay | Admitting: Family Medicine

## 2015-01-17 NOTE — Telephone Encounter (Signed)
Caller name: Sherrill Relation to pt: self Call back number: [phone] Pharmacy:  Reason for call:   Patient states that no one has called him regarding [name] results from two weeks ago. I looked in chart and it says that Santiago Glad called patient and informed him but patient states that he has not talked to anyone.

## 2015-01-17 NOTE — Telephone Encounter (Signed)
FYI

## 2015-01-18 NOTE — Telephone Encounter (Signed)
I called pt on ultrasound report. He has appointment with urologist on April the 8th.

## 2015-01-24 ENCOUNTER — Other Ambulatory Visit: Payer: Self-pay

## 2015-01-24 ENCOUNTER — Other Ambulatory Visit (INDEPENDENT_AMBULATORY_CARE_PROVIDER_SITE_OTHER): Payer: Medicare Other

## 2015-01-24 LAB — CBC WITH DIFFERENTIAL/PLATELET
BASOS ABS: 0 10*3/uL (ref 0.0–0.1)
Basophils Relative: 0.5 % (ref 0.0–3.0)
EOS ABS: 0.2 10*3/uL (ref 0.0–0.7)
Eosinophils Relative: 3.6 % (ref 0.0–5.0)
HCT: 46.1 % (ref 39.0–52.0)
Hemoglobin: 16 g/dL (ref 13.0–17.0)
LYMPHS ABS: 1.2 10*3/uL (ref 0.7–4.0)
LYMPHS PCT: 19.3 % (ref 12.0–46.0)
MCHC: 34.6 g/dL (ref 30.0–36.0)
MCV: 96 fl (ref 78.0–100.0)
MONO ABS: 0.7 10*3/uL (ref 0.1–1.0)
MONOS PCT: 12.3 % — AB (ref 3.0–12.0)
Neutro Abs: 3.9 10*3/uL (ref 1.4–7.7)
Neutrophils Relative %: 64.3 % (ref 43.0–77.0)
PLATELETS: 185 10*3/uL (ref 150.0–400.0)
RBC: 4.8 Mil/uL (ref 4.22–5.81)
RDW: 12.9 % (ref 11.5–15.5)
WBC: 6 10*3/uL (ref 4.0–10.5)

## 2015-01-24 LAB — FERRITIN: FERRITIN: 27.9 ng/mL (ref 22.0–322.0)

## 2015-01-24 NOTE — Progress Notes (Signed)
Quick Note:  Continue phlebotomy every 2 months - no ferritin test next time ______

## 2015-01-25 NOTE — Pre-Procedure Instructions (Signed)
Devin Schmidt  01/25/2015   Your procedure is scheduled on:  Tuesday February 01, 2015 at 7:15 AM.  Report to [facility] Admitting at 5:30 AM.  Call this number if you have problems the morning of surgery: [phone]   Remember:   Do not eat food or drink liquids after midnight.   Take these medicines the morning of surgery with A SIP OF WATER: NONE   Do not wear jewelry.  Do not wear lotions, powders, or cologne.   Men may shave face and neck.  Do not bring valuables to the hospital.  [facility] is not responsible for any belongings or valuables.               Contacts, dentures or bridgework may not be worn into surgery.  Leave suitcase in the car. After surgery it may be brought to your room.  For patients admitted to the hospital, discharge time is determined by your treatment team.               Patients discharged the day of surgery will not be allowed to drive home.  Name and phone number of your driver:   Special Instructions: Shower using CHG soap the night before and the morning of your surgery   Please read over the following fact sheets that you were given: Pain Booklet, Coughing and Deep Breathing and Surgical Site Infection Prevention

## 2015-01-26 ENCOUNTER — Encounter (HOSPITAL_COMMUNITY)
Admission: RE | Admit: 2015-01-26 | Discharge: 2015-01-26 | Disposition: A | Discharge status: Home / Self Care | Payer: Medicare Other | Source: Ambulatory Visit | Attending: General Surgery | Admitting: General Surgery

## 2015-01-26 ENCOUNTER — Encounter (HOSPITAL_COMMUNITY): Payer: Self-pay

## 2015-01-26 DIAGNOSIS — G250 Essential tremor: Secondary | ICD-10-CM | POA: Diagnosis not present

## 2015-01-26 DIAGNOSIS — Z01812 Encounter for preprocedural laboratory examination: Secondary | ICD-10-CM | POA: Insufficient documentation

## 2015-01-26 DIAGNOSIS — K4090 Unilateral inguinal hernia, without obstruction or gangrene, not specified as recurrent: Secondary | ICD-10-CM | POA: Insufficient documentation

## 2015-01-26 DIAGNOSIS — Z01810 Encounter for preprocedural cardiovascular examination: Secondary | ICD-10-CM | POA: Diagnosis not present

## 2015-01-26 DIAGNOSIS — Z87891 Personal history of nicotine dependence: Secondary | ICD-10-CM | POA: Insufficient documentation

## 2015-01-26 DIAGNOSIS — Z01818 Encounter for other preprocedural examination: Secondary | ICD-10-CM | POA: Diagnosis not present

## 2015-01-26 LAB — BASIC METABOLIC PANEL WITH GFR
Anion gap: 8 (ref 5–15)
BUN: 14 mg/dL (ref 6–23)
CO2: 28 mmol/L (ref 19–32)
Calcium: 9.2 mg/dL (ref 8.4–10.5)
Chloride: 105 mmol/L (ref 96–112)
Creatinine, Ser: 1.13 mg/dL (ref 0.50–1.35)
GFR calc Af Amer: 71 mL/min — ABNORMAL LOW
GFR calc non Af Amer: 61 mL/min — ABNORMAL LOW
Glucose, Bld: 74 mg/dL (ref 70–99)
Potassium: 4.2 mmol/L (ref 3.5–5.1)
Sodium: 141 mmol/L (ref 135–145)

## 2015-01-26 LAB — CBC WITH DIFFERENTIAL/PLATELET
Basophils Absolute: 0 K/uL (ref 0.0–0.1)
Basophils Relative: 0 % (ref 0–1)
Eosinophils Absolute: 0.2 K/uL (ref 0.0–0.7)
Eosinophils Relative: 3 % (ref 0–5)
HCT: 43.7 % (ref 39.0–52.0)
Hemoglobin: 15.1 g/dL (ref 13.0–17.0)
Lymphocytes Relative: 22 % (ref 12–46)
Lymphs Abs: 1.4 K/uL (ref 0.7–4.0)
MCH: 33.6 pg (ref 26.0–34.0)
MCHC: 34.6 g/dL (ref 30.0–36.0)
MCV: 97.3 fL (ref 78.0–100.0)
Monocytes Absolute: 1 K/uL (ref 0.1–1.0)
Monocytes Relative: 16 % — ABNORMAL HIGH (ref 3–12)
Neutro Abs: 3.8 K/uL (ref 1.7–7.7)
Neutrophils Relative %: 59 % (ref 43–77)
Platelets: 170 K/uL (ref 150–400)
RBC: 4.49 MIL/uL (ref 4.22–5.81)
RDW: 12.5 % (ref 11.5–15.5)
WBC: 6.4 K/uL (ref 4.0–10.5)

## 2015-01-26 NOTE — Progress Notes (Signed)
PCP is Garnet Koyanagi. Patient denied having any cardiac or pulmonary issues.

## 2015-01-27 NOTE — Progress Notes (Signed)
Anesthesia Chart Review:  Patient is a 74 year old male scheduled for   History includes former smoker, hemochromatosis (plebotomy every two months per neurology notes), appendectomy, tonsillectomy. Sees neurologist Dr. Carles Collet from essential tremor. PCP is Dr. Garnet Koyanagi.    EKG 01/26/15: NSR. No change from 01/10/04.   Preoperative labs noted.  Anticipate that he can proceed as planned.  George Hugh [facility] Short Stay Center/Anesthesiology Phone [phone] 01/27/2015 4:00 PM

## 2015-01-28 DIAGNOSIS — K4090 Unilateral inguinal hernia, without obstruction or gangrene, not specified as recurrent: Secondary | ICD-10-CM | POA: Diagnosis not present

## 2015-01-28 DIAGNOSIS — N503 Cyst of epididymis: Secondary | ICD-10-CM | POA: Diagnosis not present

## 2015-01-31 MED ORDER — CHLORHEXIDINE GLUCONATE 4 % EX LIQD
1.0000 "application " | Freq: Once | CUTANEOUS | Status: DC
  Filled 2015-01-31: qty 15

## 2015-01-31 MED ORDER — CEFAZOLIN SODIUM-DEXTROSE 2-3 GM-% IV SOLR
2.0000 g | INTRAVENOUS | Status: AC
  Administered 2015-02-01: 2 g via INTRAVENOUS
  Filled 2015-01-31: qty 50

## 2015-02-01 ENCOUNTER — Encounter (HOSPITAL_COMMUNITY): Payer: Self-pay | Admitting: *Deleted

## 2015-02-01 ENCOUNTER — Ambulatory Visit (HOSPITAL_COMMUNITY): Payer: Medicare Other | Admitting: Vascular Surgery

## 2015-02-01 ENCOUNTER — Ambulatory Visit (HOSPITAL_COMMUNITY): Payer: Medicare Other | Admitting: Certified Registered Nurse Anesthetist

## 2015-02-01 ENCOUNTER — Inpatient Hospital Stay (HOSPITAL_COMMUNITY)
Admission: RE | Admit: 2015-02-01 | Discharge: 2015-02-03 | DRG: 983 | Disposition: A | Discharge status: Home / Self Care | Payer: Medicare Other | Source: Ambulatory Visit | Attending: General Surgery | Admitting: General Surgery

## 2015-02-01 ENCOUNTER — Encounter (HOSPITAL_COMMUNITY)
Admission: RE | Disposition: A | Discharge status: Home / Self Care | Payer: Self-pay | Source: Ambulatory Visit | Attending: General Surgery

## 2015-02-01 DIAGNOSIS — Y846 Urinary catheterization as the cause of abnormal reaction of the patient, or of later complication, without mention of misadventure at the time of the procedure: Secondary | ICD-10-CM | POA: Diagnosis present

## 2015-02-01 DIAGNOSIS — Z87891 Personal history of nicotine dependence: Secondary | ICD-10-CM

## 2015-02-01 DIAGNOSIS — R338 Other retention of urine: Secondary | ICD-10-CM | POA: Diagnosis not present

## 2015-02-01 DIAGNOSIS — T8383XA Hemorrhage of genitourinary prosthetic devices, implants and grafts, initial encounter: Principal | ICD-10-CM | POA: Diagnosis present

## 2015-02-01 DIAGNOSIS — Z9889 Other specified postprocedural states: Secondary | ICD-10-CM

## 2015-02-01 DIAGNOSIS — K4090 Unilateral inguinal hernia, without obstruction or gangrene, not specified as recurrent: Secondary | ICD-10-CM | POA: Diagnosis not present

## 2015-02-01 DIAGNOSIS — Z79899 Other long term (current) drug therapy: Secondary | ICD-10-CM

## 2015-02-01 DIAGNOSIS — R339 Retention of urine, unspecified: Secondary | ICD-10-CM | POA: Diagnosis not present

## 2015-02-01 DIAGNOSIS — R310 Gross hematuria: Secondary | ICD-10-CM | POA: Diagnosis not present

## 2015-02-01 DIAGNOSIS — Z8719 Personal history of other diseases of the digestive system: Secondary | ICD-10-CM

## 2015-02-01 DIAGNOSIS — K649 Unspecified hemorrhoids: Secondary | ICD-10-CM | POA: Diagnosis present

## 2015-02-01 DIAGNOSIS — Z01818 Encounter for other preprocedural examination: Secondary | ICD-10-CM

## 2015-02-01 HISTORY — PX: INSERTION OF MESH: SHX5868

## 2015-02-01 HISTORY — PX: INGUINAL HERNIA REPAIR: SHX194

## 2015-02-01 HISTORY — PX: INGUINAL HERNIA REPAIR: SUR1180

## 2015-02-01 SURGERY — REPAIR, HERNIA, INGUINAL, LAPAROSCOPIC
Anesthesia: General | Site: Groin | Laterality: Right

## 2015-02-01 MED ORDER — HYDROMORPHONE HCL 1 MG/ML IJ SOLN
0.2500 mg | INTRAMUSCULAR | Status: DC | PRN
  Administered 2015-02-01 (×2): 0.25 mg via INTRAVENOUS
  Administered 2015-02-01: 0.5 mg via INTRAVENOUS

## 2015-02-01 MED ORDER — NEOSTIGMINE METHYLSULFATE 10 MG/10ML IV SOLN
INTRAVENOUS | Status: AC
  Filled 2015-02-01: qty 1

## 2015-02-01 MED ORDER — GLYCOPYRROLATE 0.2 MG/ML IJ SOLN
INTRAMUSCULAR | Status: AC
  Filled 2015-02-01: qty 4

## 2015-02-01 MED ORDER — PROPOFOL 10 MG/ML IV BOLUS
INTRAVENOUS | Status: AC
  Filled 2015-02-01: qty 20

## 2015-02-01 MED ORDER — DEXAMETHASONE SODIUM PHOSPHATE 4 MG/ML IJ SOLN
INTRAMUSCULAR | Status: DC | PRN
  Administered 2015-02-01: 8 mg via INTRAVENOUS

## 2015-02-01 MED ORDER — MIDAZOLAM HCL 2 MG/2ML IJ SOLN
INTRAMUSCULAR | Status: AC
  Filled 2015-02-01: qty 2

## 2015-02-01 MED ORDER — ROCURONIUM BROMIDE 100 MG/10ML IV SOLN
INTRAVENOUS | Status: DC | PRN
  Administered 2015-02-01: 40 mg via INTRAVENOUS
  Administered 2015-02-01 (×2): 10 mg via INTRAVENOUS

## 2015-02-01 MED ORDER — OXYCODONE HCL 5 MG PO TABS
5.0000 mg | ORAL_TABLET | Freq: Once | ORAL | Status: AC | PRN
  Administered 2015-02-01: 5 mg via ORAL

## 2015-02-01 MED ORDER — HYDROCODONE-ACETAMINOPHEN 5-325 MG PO TABS: 1.0000 | ORAL_TABLET | ORAL | Status: DC | PRN

## 2015-02-01 MED ORDER — ROCURONIUM BROMIDE 50 MG/5ML IV SOLN
INTRAVENOUS | Status: AC
  Filled 2015-02-01: qty 2

## 2015-02-01 MED ORDER — ONDANSETRON HCL 4 MG/2ML IJ SOLN
INTRAMUSCULAR | Status: DC | PRN
  Administered 2015-02-01: 4 mg via INTRAVENOUS

## 2015-02-01 MED ORDER — OXYCODONE HCL 5 MG PO TABS
ORAL_TABLET | ORAL | Status: AC
Start: 2015-02-01 — End: 2015-02-01
  Filled 2015-02-01: qty 1

## 2015-02-01 MED ORDER — DEXAMETHASONE SODIUM PHOSPHATE 4 MG/ML IJ SOLN
INTRAMUSCULAR | Status: AC
  Filled 2015-02-01: qty 2

## 2015-02-01 MED ORDER — OXYCODONE-ACETAMINOPHEN 5-325 MG PO TABS
1.0000 | ORAL_TABLET | ORAL | Status: DC | PRN
  Administered 2015-02-02 – 2015-02-03 (×2): 1 via ORAL
  Filled 2015-02-01 (×2): qty 1

## 2015-02-01 MED ORDER — FENTANYL CITRATE 0.05 MG/ML IJ SOLN
INTRAMUSCULAR | Status: DC | PRN
  Administered 2015-02-01: 100 ug via INTRAVENOUS
  Administered 2015-02-01: 50 ug via INTRAVENOUS
  Administered 2015-02-01: 100 ug via INTRAVENOUS

## 2015-02-01 MED ORDER — MIDAZOLAM HCL 5 MG/5ML IJ SOLN
INTRAMUSCULAR | Status: DC | PRN
  Administered 2015-02-01: 2 mg via INTRAVENOUS

## 2015-02-01 MED ORDER — POLYMYXIN B SULFATE 500000 UNITS IJ SOLR
INTRAMUSCULAR | Status: DC | PRN
  Administered 2015-02-01: 500 mL

## 2015-02-01 MED ORDER — BUPIVACAINE-EPINEPHRINE (PF) 0.25% -1:200000 IJ SOLN
INTRAMUSCULAR | Status: AC
  Filled 2015-02-01: qty 30

## 2015-02-01 MED ORDER — PHENYLEPHRINE HCL 10 MG/ML IJ SOLN
INTRAMUSCULAR | Status: DC | PRN
  Administered 2015-02-01 (×2): 80 ug via INTRAVENOUS

## 2015-02-01 MED ORDER — PROPOFOL 10 MG/ML IV BOLUS
INTRAVENOUS | Status: DC | PRN
  Administered 2015-02-01 (×2): 100 mg via INTRAVENOUS

## 2015-02-01 MED ORDER — HYDROMORPHONE HCL 1 MG/ML IJ SOLN
INTRAMUSCULAR | Status: AC
  Filled 2015-02-01: qty 1

## 2015-02-01 MED ORDER — NEOSTIGMINE METHYLSULFATE 10 MG/10ML IV SOLN
INTRAVENOUS | Status: DC | PRN
  Administered 2015-02-01: 5 mg via INTRAVENOUS

## 2015-02-01 MED ORDER — PROMETHAZINE HCL 25 MG/ML IJ SOLN: 6.2500 mg | INTRAMUSCULAR | Status: DC | PRN

## 2015-02-01 MED ORDER — SUCCINYLCHOLINE CHLORIDE 20 MG/ML IJ SOLN
INTRAMUSCULAR | Status: AC
  Filled 2015-02-01: qty 1

## 2015-02-01 MED ORDER — LACTATED RINGERS IV SOLN
INTRAVENOUS | Status: DC | PRN
  Administered 2015-02-01 (×2): via INTRAVENOUS

## 2015-02-01 MED ORDER — 0.9 % SODIUM CHLORIDE (POUR BTL) OPTIME
TOPICAL | Status: DC | PRN
  Administered 2015-02-01: 1000 mL

## 2015-02-01 MED ORDER — GLYCOPYRROLATE 0.2 MG/ML IJ SOLN
INTRAMUSCULAR | Status: DC | PRN
  Administered 2015-02-01: .8 mg via INTRAVENOUS

## 2015-02-01 MED ORDER — BUPIVACAINE-EPINEPHRINE 0.25% -1:200000 IJ SOLN
INTRAMUSCULAR | Status: DC | PRN
  Administered 2015-02-01: 10 mL

## 2015-02-01 MED ORDER — OXYCODONE HCL 5 MG/5ML PO SOLN: 5.0000 mg | Freq: Once | ORAL | Status: AC | PRN

## 2015-02-01 MED ORDER — STERILE WATER FOR INJECTION IJ SOLN
INTRAMUSCULAR | Status: AC
  Filled 2015-02-01: qty 10

## 2015-02-01 MED ORDER — ROCURONIUM BROMIDE 50 MG/5ML IV SOLN
INTRAVENOUS | Status: AC
  Filled 2015-02-01: qty 1

## 2015-02-01 MED ORDER — EPHEDRINE SULFATE 50 MG/ML IJ SOLN
INTRAMUSCULAR | Status: AC
Start: 2015-02-01 — End: 2015-02-01
  Filled 2015-02-01: qty 1

## 2015-02-01 MED ORDER — FENTANYL CITRATE 0.05 MG/ML IJ SOLN
INTRAMUSCULAR | Status: AC
  Filled 2015-02-01: qty 5

## 2015-02-01 MED ORDER — PHENYLEPHRINE 40 MCG/ML (10ML) SYRINGE FOR IV PUSH (FOR BLOOD PRESSURE SUPPORT)
PREFILLED_SYRINGE | INTRAVENOUS | Status: AC
  Filled 2015-02-01: qty 10

## 2015-02-01 MED ORDER — LIDOCAINE HCL (CARDIAC) 20 MG/ML IV SOLN
INTRAVENOUS | Status: DC | PRN
  Administered 2015-02-01: 60 mg via INTRAVENOUS

## 2015-02-01 SURGICAL SUPPLY — 43 items
APPLIER CLIP 5 13 M/L LIGAMAX5 (MISCELLANEOUS) ×4
APR CLP MED LRG 5 ANG JAW (MISCELLANEOUS) ×2
BAG DECANTER FOR FLEXI CONT (MISCELLANEOUS) ×4 IMPLANT
CHLORAPREP W/TINT 26ML (MISCELLANEOUS) ×4 IMPLANT
CLIP APPLIE 5 13 M/L LIGAMAX5 (MISCELLANEOUS) IMPLANT
CLOSURE WOUND 1/2 X4 (GAUZE/BANDAGES/DRESSINGS) ×1
COVER SURGICAL LIGHT HANDLE (MISCELLANEOUS) ×4 IMPLANT
DECANTER SPIKE VIAL GLASS SM (MISCELLANEOUS) ×2 IMPLANT
DEVICE SECURE STRAP 25 ABSORB (INSTRUMENTS) ×4 IMPLANT
DISSECT BALLN SPACEMKR + OVL (BALLOONS) ×4
DISSECTOR BALLN SPACEMKR + OVL (BALLOONS) ×2 IMPLANT
DISSECTOR BLUNT TIP ENDO 5MM (MISCELLANEOUS) IMPLANT
DRAPE LAPAROSCOPIC ABDOMINAL (DRAPES) ×4 IMPLANT
DRSG TEGADERM 2-3/8X2-3/4 SM (GAUZE/BANDAGES/DRESSINGS) ×6 IMPLANT
ELECT REM PT RETURN 9FT ADLT (ELECTROSURGICAL) ×4
ELECTRODE REM PT RTRN 9FT ADLT (ELECTROSURGICAL) ×2 IMPLANT
GLOVE BIO SURGEON STRL SZ7 (GLOVE) ×4 IMPLANT
GLOVE BIOGEL PI IND STRL 7.0 (GLOVE) IMPLANT
GLOVE BIOGEL PI IND STRL 7.5 (GLOVE) IMPLANT
GLOVE BIOGEL PI IND STRL 8 (GLOVE) ×2 IMPLANT
GLOVE BIOGEL PI INDICATOR 7.0 (GLOVE) ×4
GLOVE BIOGEL PI INDICATOR 7.5 (GLOVE) ×2
GLOVE BIOGEL PI INDICATOR 8 (GLOVE) ×2
GLOVE ECLIPSE 7.5 STRL STRAW (GLOVE) ×7 IMPLANT
GOWN STRL REUS W/ TWL LRG LVL3 (GOWN DISPOSABLE) ×4 IMPLANT
GOWN STRL REUS W/TWL LRG LVL3 (GOWN DISPOSABLE) ×8
KIT BASIN OR (CUSTOM PROCEDURE TRAY) ×4 IMPLANT
KIT ROOM TURNOVER OR (KITS) ×4 IMPLANT
LIQUID BAND (GAUZE/BANDAGES/DRESSINGS) ×4 IMPLANT
MESH 3DMAX 5X7 RT XLRG (Mesh General) ×2 IMPLANT
NS IRRIG 1000ML POUR BTL (IV SOLUTION) ×4 IMPLANT
PAD ARMBOARD 7.5X6 YLW CONV (MISCELLANEOUS) ×8 IMPLANT
PENCIL BUTTON HOLSTER BLD 10FT (ELECTRODE) IMPLANT
SET IRRIG TUBING LAPAROSCOPIC (IRRIGATION / IRRIGATOR) IMPLANT
SET TROCAR LAP APPLE-HUNT 5MM (ENDOMECHANICALS) ×4 IMPLANT
STRIP CLOSURE SKIN 1/2X4 (GAUZE/BANDAGES/DRESSINGS) ×1 IMPLANT
SUT MNCRL AB 4-0 PS2 18 (SUTURE) ×4 IMPLANT
TOWEL OR 17X24 6PK STRL BLUE (TOWEL DISPOSABLE) ×4 IMPLANT
TOWEL OR 17X26 10 PK STRL BLUE (TOWEL DISPOSABLE) ×4 IMPLANT
TRAY FOLEY CATH 14FRSI W/METER (CATHETERS) ×2 IMPLANT
TRAY LAPAROSCOPIC (CUSTOM PROCEDURE TRAY) ×4 IMPLANT
TUBING INSUFFLATION (TUBING) ×4 IMPLANT
WATER STERILE IRR 1000ML POUR (IV SOLUTION) IMPLANT

## 2015-02-01 NOTE — Progress Notes (Signed)
Report called to RN for 6-N-11 and transferred via wheelchair

## 2015-02-01 NOTE — Anesthesia Procedure Notes (Signed)
Procedure Name: Intubation Date/Time: 02/01/2015 7:30 AM Performed by: Ollen Bowl Pre-anesthesia Checklist: Patient identified, Emergency Drugs available, Suction available, Patient being monitored and Timeout performed Patient Re-evaluated:Patient Re-evaluated prior to inductionOxygen Delivery Method: Circle system utilized and Simple face mask Preoxygenation: Pre-oxygenation with 100% oxygen Intubation Type: Combination inhalational/ intravenous induction Ventilation: Mask ventilation with difficulty and Oral airway inserted - appropriate to patient size Laryngoscope Size: Sabra Heck and 2 Grade View: Grade II Tube type: Oral Tube size: 7.5 mm Number of attempts: 2 Airway Equipment and Method: Patient positioned with wedge pillow and Stylet Placement Confirmation: ETT inserted through vocal cords under direct vision,  positive ETCO2 and breath sounds checked- equal and bilateral Secured at: 23 cm Tube secured with: Tape Dental Injury: Injury to tongue  Difficulty Due To: Difficulty was unanticipated and Difficult Airway- due to immobile epiglottis Future Recommendations: Recommend- induction with short-acting agent, and alternative techniques readily available Comments: Anterior airway. Grade 4 with mac 4, floppy epiglotis. Managable grade 2 airway woth miller 2. Small bruise noted to tip of tongue. Decadron given.

## 2015-02-01 NOTE — Transfer of Care (Signed)
Immediate Anesthesia Transfer of Care Note  Patient: Devin Schmidt  Procedure(s) Performed: Procedure(s): LAPAROSCOPIC RIGHT INGUINAL HERNIA REPAIR (N/A) INSERTION OF MESH (Right)  Patient Location: PACU  Anesthesia Type:General  Level of Consciousness: awake, alert  and oriented  Airway & Oxygen Therapy: Patient Spontanous Breathing and Patient connected to nasal cannula oxygen  Post-op Assessment: Report given to RN and Post -op Vital signs reviewed and stable  Post vital signs: Reviewed and stable  Last Vitals:  Filed Vitals:   02/01/15 0907  BP: 125/69  Pulse: 81  Temp: 36.4 C  Resp: 12    Complications: No apparent anesthesia complications

## 2015-02-01 NOTE — Op Note (Signed)
OPERATIVE REPORT  DATE OF OPERATION: 02/01/2015  PATIENT:  Devin Schmidt  74 y.o. male  PRE-OPERATIVE DIAGNOSIS:  right inguinal hernia  POST-OPERATIVE DIAGNOSIS:  right inguinal hernia, direct  PROCEDURE:  Procedure(s): LAPAROSCOPIC RIGHT INGUINAL HERNIA REPAIR INSERTION OF MESH  SURGEON:  Surgeon(s): Doreen Salvage, MD  ASSISTANT: None  ANESTHESIA:   general  EBL: <20 ml  BLOOD ADMINISTERED: none  DRAINS: none   SPECIMEN:  No Specimen  COUNTS CORRECT:  YES  PROCEDURE DETAILS: The patient was taken to the operating room and placed on the table in the supine position. After an adequate general endotracheal anesthetic was administered he was prepped and draped in usual sterile manner exposing the right side of his abdomen and his inguinal area.  A proper timeout was performed identifying the patient and procedure to be performed. The initial incision was just lateral to the umbilicus on the right side in the center of the right rectus muscle. A transverse incision was made in the skin that we dissected down to the anterior rectus sheath. A transverse incision was made in the rectus sheath using a 15 blade then we dissected out to the posterior sheath where we made a tunnel inferiorly down towards the pubic tubercle. A dissecting balloon and cannula pass down through the incision in the posterior sheath down to the pubic tubercle where we insufflated with gas from a bulb inserted into the device. This was done under direct vision and dissection of the preperitoneal space was nearly complete.  We subsequently removed the dissecting balloon then insufflated carbon dioxide gas into the preperitoneal space. 25 mm cannulas were passed in the midline one just suprapubically and one just midway between the umbilicus and the pubis. It was through the scanning less than dissecting instruments were passed and we subsequently dissected out the preperitoneal space and the hernia defect.  The  patient had a large direct inguinal defect in which was inserted a large lump of fatty tissue which is dissected free of the spermatic cord. It was ligated and clipped at its base using endoclips and subsequently removed.  We dissected out the area freely and then used a extra large piece of 3-D Bard mesh. It was tacked in place along the right pubic crest into Cooper's ligament and superiorly above the hernia defect. It was tucked laterally and not stapled. We dissected anteriorly along the abdominal wall up to the inferior epigastric vessels and just lateral to the inferior epigastric. Once it was in place and secured we allowed the gas to escape to the cannulas and removed all devices.  The mesh had been soaked in antibiotic solution prior to being implanted.  0.25% Marcaine without epinephrine was injected at all sites. We then closed all sites used Dermabond Steri-Strips and Tegaderms reinforced dressings. All counts were correct including needles sponges and instruments.  It was reported by the nurses postoperatively the patient had a small amount of blood come out after his Foley was removed.. It was an atraumatic insertion by their report, however as I observe the insertion, the catheter was not fuuly inserted to the hilt when the balloon was inflated. The dissection of spermatic cord may have cause a little bit of bleeding, but this would not likely cause hematuria. This should also decrease with time. Also the patient had a fair amount of subcutaneous air scrotum and around his penis which probably is also from the dissection of the large hernia defect.  PATIENT DISPOSITION:  PACU - hemodynamically stable.  Arianah Torgeson, [name] 4/12/20168:47 AM

## 2015-02-01 NOTE — Progress Notes (Signed)
#  16 french foley inserted with 600cc pink urine and small amt of bleeding around urethra and Dr Gershon Crane notified and orders noted

## 2015-02-01 NOTE — H&P (Signed)
  Devin Schmidt 01/11/2015 11:25 AM Location: [facility] Patient #: [id number] DOB: [birth year] Single / Language: Cleophus Molt / Race: White Male  History of Present Illness Sharyn Lull R. Brooks CMA; 01/11/2015 11:26 AM) Patient words: groin pain.  The patient is a 74 year old male    Other Problems Ventura Sellers, [place]; 01/11/2015 11:26 AM) Hemorrhoids  Past Surgical History Ventura Sellers, [place]; 01/11/2015 11:26 AM) Appendectomy Colon Polyp Removal - Colonoscopy  Diagnostic Studies History Ventura Sellers, [place]; 01/11/2015 11:26 AM) Colonoscopy 1-5 years ago  Allergies Ventura Sellers, CMA; 01/11/2015 11:27 AM) No Known Drug Allergies03/22/2016  Medication History Ventura Sellers, CMA; 01/11/2015 11:27 AM) Primidone (50MG  Tablet, Oral) Active.  Social History Ventura Sellers, [place]; 01/11/2015 11:26 AM) Alcohol use Occasional alcohol use. No caffeine use No drug use Tobacco use Former smoker.  Family History Ventura Sellers, [place]; 01/11/2015 11:26 AM) First Degree Relatives No pertinent family history  Review of Systems Sharyn Lull R. Brooks CMA; 01/11/2015 11:26 AM) General Not Present- Appetite Loss, Chills, Fatigue, Fever, Night Sweats, Weight Gain and Weight Loss. Skin Not Present- Change in Wart/Mole, Dryness, Hives, Jaundice, New Lesions, Non-Healing Wounds, Rash and Ulcer. HEENT Not Present- Earache, Hearing Loss, Hoarseness, Nose Bleed, Oral Ulcers, Ringing in the Ears, Seasonal Allergies, Sinus Pain, Sore Throat, Visual Disturbances, Wears glasses/contact lenses and Yellow Eyes. Respiratory Not Present- Bloody sputum, Chronic Cough, Difficulty Breathing, Snoring and Wheezing. Breast Not Present- Breast Mass, Breast Pain, Nipple Discharge and Skin Changes. Cardiovascular Not Present- Chest Pain, Difficulty Breathing Lying Down, Leg Cramps, Palpitations, Rapid Heart Rate, Shortness of Breath and Swelling of Extremities. Male Genitourinary  Not Present- Blood in Urine, Change in Urinary Stream, Frequency, Impotence, Nocturia, Painful Urination, Urgency and Urine Leakage.   Vitals [employer] R. Brooks CMA; 01/11/2015 11:25 AM) 01/11/2015 11:25 AM Weight: 185.25 lb Height: 72in Body Surface Area: 2.07 m Body Mass Index: 25.12 kg/m BP: 118/78 (Sitting, Left Arm, Standard)    Physical Exam (Rebecka Oelkers O. Hulen Skains MD; 01/11/2015 11:34 AM) Chest and Lung Exam Chest and lung exam reveals -normal excursion with symmetric chest walls, quiet, even and easy respiratory effort with no use of accessory muscles, non-tender and normal tactile fremitus and on auscultation, normal breath sounds, no adventitious sounds and normal vocal resonance.  Cardiovascular Cardiovascular examination reveals -normal heart sounds, regular rate and rhythm with no murmurs.  Abdomen Inspection Hernias - Inguinal hernia - Right - Reducible. Note: Palpable right inguinal hernia on digital examination.  Male Genitourinary Evaluation of genitourinary system reveals-scrotum non-tender, no masses, normal testes, no palpable masses and normal penis.    Assessment & Plan Jeneen Rinks O. Taleia Sadowski MD; 01/11/2015 11:35 AM) INGUINAL HERNIA, RIGHT (550.90  K40.90) Impression: Symptomatic right inguinal hernia, indirect likely. Laparoscopic repair suggested. Patient wants to go ahead with surgery.

## 2015-02-01 NOTE — Discharge Instructions (Addendum)
Hernia Repair with Laparoscope A hernia occurs when an internal organ pushes out through a weak spot in the belly (abdominal) wall muscles. Hernias most commonly occur in the groin and around the navel. Hernias can also occur through a cut by the surgeon (incision) after an abdominal operation. A hernia may be caused by:  Lifting heavy objects.  Prolonged coughing.  Straining to move your bowels. Hernias can often be pushed back into place (reduced). Most hernias tend to get worse over time. Problems occur when abdominal contents get stuck in the opening and the blood supply is blocked or impaired (incarcerated hernia). Because of these risks, you require surgery to repair the hernia. Your hernia will be repaired using a laparoscope. Laparoscopic surgery is a type of minimally invasive surgery. It does not involve making a typical surgical cut (incision) in the skin. A laparoscope is a telescope-like rod and lens system. It is usually connected to a video camera and a light source so your caregiver can clearly see the operative area. The instruments are inserted through  to  inch (5 mm or 10 mm) openings in the skin at specific locations. A working and viewing space is created by blowing a small amount of carbon dioxide gas into the abdominal cavity. The abdomen is essentially blown up like a balloon (insufflated). This elevates the abdominal wall above the internal organs like a dome. The carbon dioxide gas is common to the human body and can be absorbed by tissue and removed by the respiratory system. Once the repair is completed, the small incisions will be closed with either stitches (sutures) or staples (just like a paper stapler only this staple holds the skin together). LET YOUR CAREGIVERS KNOW ABOUT:  Allergies.  Medications taken including herbs, eye drops, over the counter medications, and creams.  Use of steroids (by mouth or creams).  Previous problems with anesthetics or  Novocaine.  Possibility of pregnancy, if this applies.  History of blood clots (thrombophlebitis).  History of bleeding or blood problems.  Previous surgery.  Other health problems. BEFORE THE PROCEDURE  Laparoscopy can be done either in a hospital or out-patient clinic. You may be given a mild sedative to help you relax before the procedure. Once in the operating room, you will be given a general anesthesia to make you sleep (unless you and your caregiver choose a different anesthetic).  AFTER THE PROCEDURE  After the procedure you will be watched in a recovery area. Depending on what type of hernia was repaired, you might be admitted to the hospital or you might go home the same day. With this procedure you may have less pain and scarring. This usually results in a quicker recovery and less risk of infection. HOME CARE INSTRUCTIONS   Bed rest is not required. You may continue your normal activities but avoid heavy lifting (more than 10 pounds) or straining.  Cough gently. If you are a smoker it is best to stop, as even the best hernia repair can break down with the continual strain of coughing.  Avoid driving until given the OK by your surgeon.  There are no dietary restrictions unless given otherwise.  TAKE ALL MEDICATIONS AS DIRECTED.  Only take over-the-counter or prescription medicines for pain, discomfort, or fever as directed by your caregiver. SEEK MEDICAL CARE IF:   There is increasing abdominal pain or pain in your incisions.  There is more bleeding from incisions, other than minimal spotting.  You feel light headed or faint.  You  develop an unexplained fever, chills, and/or an oral temperature above 102 F (38.9 C).  You have redness, swelling, or increasing pain in the wound.  Pus coming from wound.  A foul smell coming from the wound or dressings. SEEK IMMEDIATE MEDICAL CARE IF:   You develop a rash.  You have difficulty breathing.  You have any  allergic problems. MAKE SURE YOU:   Understand these instructions.  Will watch your condition.  Will get help right away if you are not doing well or get worse. Document Released: 10/08/2005 Document Revised: 12/31/2011 Document Reviewed: 09/07/2009 [facility] Patient Information 2015 [place], [place]. This information is not intended to replace advice given to you by your health care provider. Make sure you discuss any questions you have with your health care provider.   General Anesthesia, Care After Refer to this sheet in the next few weeks. These instructions provide you with information on caring for yourself after your procedure. Your health care provider may also give you more specific instructions. Your treatment has been planned according to current medical practices, but problems sometimes occur. Call your health care provider if you have any problems or questions after your procedure. WHAT TO EXPECT AFTER THE PROCEDURE After the procedure, it is typical to experience:  Sleepiness.  Nausea and vomiting. HOME CARE INSTRUCTIONS  For the first 24 hours after general anesthesia:  Have a responsible person with you.  Do not drive a car. If you are alone, do not take public transportation.  Do not drink alcohol.  Do not take medicine that has not been prescribed by your health care provider.  Do not sign important papers or make important decisions.  You may resume a normal diet and activities as directed by your health care provider.  Change bandages (dressings) as directed.  If you have questions or problems that seem related to general anesthesia, call the hospital and ask for the anesthetist or anesthesiologist on call. SEEK MEDICAL CARE IF:  You have nausea and vomiting that continue the day after anesthesia.  You develop a rash. SEEK IMMEDIATE MEDICAL CARE IF:   You have difficulty breathing.  You have chest pain.  You have any allergic problems. Document  Released: 01/14/2001 Document Revised: 10/13/2013 Document Reviewed: 04/23/2013 [facility] Patient Information 2015 [place], [place]. This information is not intended to replace advice given to you by your health care provider. Make sure you discuss any questions you have with your health care provider.

## 2015-02-01 NOTE — Interval H&P Note (Signed)
History and Physical Interval Note: Patient doing well and has no additional symptoms.  For laparoscopic RIH.  Marked for correct side.  Kathryne Eriksson. Dahlia Bailiff, MD, [place] [phone] [facility] 02/01/2015 7:11 AM  Devin Schmidt  has presented today for surgery, with the diagnosis of right inguinal henia  The various methods of treatment have been discussed with the patient and family. After consideration of risks, benefits and other options for treatment, the patient has consented to  Procedure(s): [facility] (Right) as a surgical intervention .  The patient's history has been reviewed, patient examined, no change in status, stable for surgery.  I have reviewed the patient's chart and labs.  Questions were answered to the patient's satisfaction.     Genevra Orne, [name]

## 2015-02-01 NOTE — Anesthesia Postprocedure Evaluation (Signed)
  Anesthesia Post-op Note  Patient: Devin Schmidt  Procedure(s) Performed: Procedure(s): LAPAROSCOPIC RIGHT INGUINAL HERNIA REPAIR (N/A) INSERTION OF MESH (Right)  Patient Location: PACU  Anesthesia Type:General  Level of Consciousness: awake and alert   Airway and Oxygen Therapy: Patient Spontanous Breathing  Post-op Pain: mild  Post-op Assessment: Post-op Vital signs reviewed  Post-op Vital Signs: Reviewed  Last Vitals:  Filed Vitals:   02/01/15 1125  BP: 133/68  Pulse: 56  Temp: 36.5 C  Resp:     Complications: No apparent anesthesia complications

## 2015-02-01 NOTE — Progress Notes (Signed)
Bladder scan of 422ml urine and paged Dr Gershon Crane

## 2015-02-01 NOTE — Anesthesia Preprocedure Evaluation (Addendum)
Anesthesia Evaluation  Patient identified by MRN, date of birth, ID band Patient awake    Reviewed: Allergy & Precautions, NPO status , Patient's Chart, lab work & pertinent test results  Airway Mallampati: II  TM Distance: >3 FB Neck ROM: Full    Dental  (+) Teeth Intact, Dental Advisory Given   Pulmonary former smoker,  breath sounds clear to auscultation        Cardiovascular negative cardio ROS  Rhythm:Regular Rate:Normal     Neuro/Psych negative neurological ROS     GI/Hepatic Neg liver ROS,   Endo/Other  negative endocrine ROS  Renal/GU negative Renal ROS     Musculoskeletal negative musculoskeletal ROS (+)   Abdominal   Peds  Hematology negative hematology ROS (+)   Anesthesia Other Findings   Reproductive/Obstetrics                            Anesthesia Physical Anesthesia Plan  ASA: II  Anesthesia Plan: General   Post-op Pain Management:    Induction: Intravenous  Airway Management Planned: Oral ETT  Additional Equipment:   Intra-op Plan:   Post-operative Plan: Extubation in OR  Informed Consent: I have reviewed the patients History and Physical, chart, labs and discussed the procedure including the risks, benefits and alternatives for the proposed anesthesia with the patient or authorized representative who has indicated his/her understanding and acceptance.   Dental advisory given  Plan Discussed with: CRNA  Anesthesia Plan Comments:         Anesthesia Quick Evaluation

## 2015-02-02 DIAGNOSIS — R310 Gross hematuria: Secondary | ICD-10-CM | POA: Diagnosis present

## 2015-02-02 DIAGNOSIS — R338 Other retention of urine: Secondary | ICD-10-CM | POA: Diagnosis present

## 2015-02-02 DIAGNOSIS — K4090 Unilateral inguinal hernia, without obstruction or gangrene, not specified as recurrent: Secondary | ICD-10-CM | POA: Diagnosis present

## 2015-02-02 DIAGNOSIS — Y846 Urinary catheterization as the cause of abnormal reaction of the patient, or of later complication, without mention of misadventure at the time of the procedure: Secondary | ICD-10-CM | POA: Diagnosis present

## 2015-02-02 DIAGNOSIS — R339 Retention of urine, unspecified: Secondary | ICD-10-CM | POA: Diagnosis present

## 2015-02-02 DIAGNOSIS — Z87891 Personal history of nicotine dependence: Secondary | ICD-10-CM | POA: Diagnosis not present

## 2015-02-02 DIAGNOSIS — T8383XA Hemorrhage of genitourinary prosthetic devices, implants and grafts, initial encounter: Secondary | ICD-10-CM | POA: Diagnosis present

## 2015-02-02 DIAGNOSIS — Z79899 Other long term (current) drug therapy: Secondary | ICD-10-CM | POA: Diagnosis not present

## 2015-02-02 DIAGNOSIS — K649 Unspecified hemorrhoids: Secondary | ICD-10-CM | POA: Diagnosis present

## 2015-02-02 LAB — CBC
HCT: 44.3 % (ref 39.0–52.0)
HEMOGLOBIN: 15.4 g/dL (ref 13.0–17.0)
MCH: 33.8 pg (ref 26.0–34.0)
MCHC: 34.8 g/dL (ref 30.0–36.0)
MCV: 97.4 fL (ref 78.0–100.0)
Platelets: 183 10*3/uL (ref 150–400)
RBC: 4.55 MIL/uL (ref 4.22–5.81)
RDW: 12.7 % (ref 11.5–15.5)
WBC: 12.3 10*3/uL — ABNORMAL HIGH (ref 4.0–10.5)

## 2015-02-02 LAB — BASIC METABOLIC PANEL
Anion gap: 12 (ref 5–15)
BUN: 12 mg/dL (ref 6–23)
CALCIUM: 9 mg/dL (ref 8.4–10.5)
CHLORIDE: 97 mmol/L (ref 96–112)
CO2: 28 mmol/L (ref 19–32)
Creatinine, Ser: 1.42 mg/dL — ABNORMAL HIGH (ref 0.50–1.35)
GFR calc Af Amer: 54 mL/min — ABNORMAL LOW (ref >90)
GFR calc non Af Amer: 46 mL/min — ABNORMAL LOW (ref >90)
Glucose, Bld: 123 mg/dL — ABNORMAL HIGH (ref 70–99)
Potassium: 3.5 mmol/L (ref 3.5–5.1)
Sodium: 137 mmol/L (ref 135–145)

## 2015-02-02 MED ORDER — DOCUSATE SODIUM 100 MG PO CAPS
100.0000 mg | ORAL_CAPSULE | Freq: Two times a day (BID) | ORAL | Status: DC
  Administered 2015-02-02 – 2015-02-03 (×2): 100 mg via ORAL
  Filled 2015-02-02 (×2): qty 1

## 2015-02-02 MED ORDER — BETHANECHOL CHLORIDE 10 MG PO TABS
10.0000 mg | ORAL_TABLET | Freq: Three times a day (TID) | ORAL | Status: DC
  Administered 2015-02-02 – 2015-02-03 (×4): 10 mg via ORAL
  Filled 2015-02-02 (×6): qty 1

## 2015-02-02 MED ORDER — TAMSULOSIN HCL 0.4 MG PO CAPS
0.4000 mg | ORAL_CAPSULE | Freq: Every day | ORAL | Status: DC
  Administered 2015-02-02: 0.4 mg via ORAL
  Filled 2015-02-02: qty 1

## 2015-02-02 MED ORDER — ENOXAPARIN SODIUM 40 MG/0.4ML ~~LOC~~ SOLN
40.0000 mg | SUBCUTANEOUS | Status: DC
Start: 2015-02-02 — End: 2015-02-03
  Administered 2015-02-02: 40 mg via SUBCUTANEOUS
  Filled 2015-02-02: qty 0.4

## 2015-02-02 MED ORDER — POLYETHYLENE GLYCOL 3350 17 G PO PACK
17.0000 g | PACK | Freq: Every day | ORAL | Status: DC | PRN
  Administered 2015-02-03: 17 g via ORAL
  Filled 2015-02-02: qty 1

## 2015-02-02 NOTE — Progress Notes (Addendum)
GS Progress Note Subjective: Patient worried about the swelling and penile/scrotal bruising.  No more hematuria.    Objective: Vital signs in last 24 hours: Temp:  [97.6 F (36.4 C)-99 F (37.2 C)] 98.8 F (37.1 C) (04/13 0548) Pulse Rate:  [52-81] 68 (04/13 0548) Resp:  [9-16] 16 (04/13 0548) BP: (110-136)/(59-73) 126/71 mmHg (04/13 0548) SpO2:  [95 %-100 %] 97 % (04/13 0548)    Intake/Output from previous day: 04/12 0701 - 04/13 0700 In: 1940 [P.O.:240; I.V.:1700] Out: 1925 [Urine:1900; Blood:25] Intake/Output this shift:    Lungs: Clear  Abd: Benign.  No hernia.  Scrotal ecchymosis and penile ecchymosis, much improved swelling.  Extremities: No changes  Neuro: Intact  Lab Results: CBC  No results for input(s): WBC, HGB, HCT, PLT in the last 72 hours. BMET No results for input(s): NA, K, CL, CO2, GLUCOSE, BUN, CREATININE, CALCIUM in the last 72 hours. PT/INR No results for input(s): LABPROT, INR in the last 72 hours. ABG No results for input(s): PHART, HCO3 in the last 72 hours.  Invalid input(s): PCO2, PO2  Studies/Results: No results found.  Anti-infectives: Anti-infectives    Start     Dose/Rate Route Frequency Ordered Stop   02/01/15 0810  polymyxin B 500,000 Units, bacitracin 50,000 Units in sodium chloride irrigation 0.9 % 500 mL irrigation  Status:  Discontinued       As needed 02/01/15 0810 02/01/15 0902   02/01/15 0600  ceFAZolin (ANCEF) IVPB 2 g/50 mL premix     2 g 100 mL/hr over 30 Minutes Intravenous On call to O.R. 01/31/15 1425 02/01/15 0735      Assessment/Plan: s/p Procedure(s): LAPAROSCOPIC RIGHT INGUINAL HERNIA REPAIR INSERTION OF MESH Advance diet Admit for urinary retention and treatment.  Will start urecholine and discontinue foley tomorrow.  I believe that the hematuria and urinary retention were cause by balloon inflation in or near the prostatic urethra.  This should improve with time.  If not then we will need a urology  consultation.  Will Keep the [facility] until tomorrow AM and Discontinue after he has been on Urecholine for 24 hours.     Kathryne Eriksson. Dahlia Bailiff, MD, FACS [phone] [facility] 02/02/2015

## 2015-02-03 ENCOUNTER — Encounter (HOSPITAL_COMMUNITY): Payer: Self-pay | Admitting: General Surgery

## 2015-02-03 LAB — BASIC METABOLIC PANEL
ANION GAP: 9 (ref 5–15)
BUN: 13 mg/dL (ref 6–23)
CHLORIDE: 103 mmol/L (ref 96–112)
CO2: 28 mmol/L (ref 19–32)
Calcium: 8.6 mg/dL (ref 8.4–10.5)
Creatinine, Ser: 1.31 mg/dL (ref 0.50–1.35)
GFR calc Af Amer: 59 mL/min — ABNORMAL LOW (ref >90)
GFR calc non Af Amer: 51 mL/min — ABNORMAL LOW (ref >90)
GLUCOSE: 97 mg/dL (ref 70–99)
POTASSIUM: 3.8 mmol/L (ref 3.5–5.1)
SODIUM: 140 mmol/L (ref 135–145)

## 2015-02-03 MED ORDER — DOCUSATE SODIUM 100 MG PO CAPS: 100.0000 mg | ORAL_CAPSULE | Freq: Two times a day (BID) | ORAL | Status: DC

## 2015-02-03 MED ORDER — TAMSULOSIN HCL 0.4 MG PO CAPS: 0.4000 mg | ORAL_CAPSULE | Freq: Every day | ORAL | Status: DC

## 2015-02-03 MED ORDER — POLYETHYLENE GLYCOL 3350 17 G PO PACK: 17.0000 g | PACK | Freq: Every day | ORAL | Status: DC | PRN

## 2015-02-03 NOTE — Discharge Summary (Signed)
Physician Discharge Summary  Patient ID: Devin Schmidt MRN: [id number] DOB/AGE: May 12, 1941 74 y.o.  Admit date: 02/01/2015 Discharge date: 02/03/2015  Admission Diagnoses:  Discharge Diagnoses:  Active Problems:   S/P inguinal hernia repair   Hematuria, gross   Discharged Condition: good  Hospital Course: Patient admitted after postoperative urinary retention associated with hematuria, likely associated with Foley insertion.  Decompressed with Foley for > 24 hours, started on Flomax and patient able to void prior to discharge.  Still some minor hematuria  Consults: urology and telephone consultation with Dr. Alinda Schmidt who will not need to see the patient as long as he is able to void.  Keep on Flomx  Significant Diagnostic Studies: labs: cbc and Bmet  Treatments: IV hydration and Foley and Flomax  Discharge Exam: Blood pressure 117/65, pulse 73, temperature 98.4 F (36.9 C), temperature source Oral, resp. rate 18, height 6' (1.829 m), weight 83.915 kg (185 lb), SpO2 94 %. General appearance: alert, cooperative, no distress and Having some right testicular pain this AM with some swelling of the right testes.  Not exquisitely tender. GI: soft, non-tender; bowel sounds normal; no masses,  no organomegaly and some bruising around incision, no infection, no recurrent hernia  Disposition: Final discharge disposition not confirmed  Discharge Instructions    Call MD for:  difficulty breathing, headache or visual disturbances    Complete by:  As directed      Call MD for:  extreme fatigue    Complete by:  As directed      Call MD for:  hives    Complete by:  As directed      Call MD for:  persistant dizziness or light-headedness    Complete by:  As directed      Call MD for:  persistant nausea and vomiting    Complete by:  As directed      Call MD for:  redness, tenderness, or signs of infection (pain, swelling, redness, odor or green/yellow discharge around incision site)    Complete  by:  As directed      Call MD for:  severe uncontrolled pain    Complete by:  As directed      Call MD for:  temperature >100.4    Complete by:  As directed      Diet - low sodium heart healthy    Complete by:  As directed      Diet general    Complete by:  As directed      Driving Restrictions    Complete by:  As directed   No driving for one week.     Increase activity slowly    Complete by:  As directed      Leave dressing on - Keep it clean, dry, and intact until clinic visit    Complete by:  As directed      Lifting restrictions    Complete by:  As directed   No lifting > 20 pounds for the next 6 weeks.     Sexual Activity Restrictions    Complete by:  As directed   Until pain is well controlled            Medication List    TAKE these medications        albuterol 108 (90 BASE) MCG/ACT inhaler  Commonly known as:  PROVENTIL HFA;VENTOLIN HFA  Inhale 2 puffs into the lungs every 6 (six) hours as needed for wheezing or shortness of breath.  budesonide-formoterol 160-4.5 MCG/ACT inhaler  Commonly known as:  SYMBICORT  Inhale 2 puffs into the lungs 2 (two) times daily.     cyclobenzaprine 5 MG tablet  Commonly known as:  FLEXERIL  Take 1 tablet (5 mg total) by mouth at bedtime.     diclofenac 75 MG EC tablet  Commonly known as:  VOLTAREN  Take 1 tablet (75 mg total) by mouth 2 (two) times daily.     docusate sodium 100 MG capsule  Commonly known as:  COLACE  Take 1 capsule (100 mg total) by mouth 2 (two) times daily.     HYDROcodone-acetaminophen 5-325 MG per tablet  Commonly known as:  NORCO/VICODIN  Take 1-2 tablets by mouth every 4 (four) hours as needed for moderate pain.     levofloxacin 500 MG tablet  Commonly known as:  LEVAQUIN  Take 1 tablet (500 mg total) by mouth daily.     polyethylene glycol packet  Commonly known as:  MIRALAX / GLYCOLAX  Take 17 g by mouth daily as needed for moderate constipation.     predniSONE 10 MG tablet  Commonly  known as:  DELTASONE  One tab by mouth daily for 3 additional days.     primidone 50 MG tablet  Commonly known as:  MYSOLINE  Take 1 tablet (50 mg total) by mouth daily.     tamsulosin 0.4 MG Caps capsule  Commonly known as:  FLOMAX  Take 1 capsule (0.4 mg total) by mouth daily after supper.     traMADol 50 MG tablet  Commonly known as:  ULTRAM  Take 1 tablet (50 mg total) by mouth every 6 (six) hours as needed for severe pain.           Follow-up Information    Follow up with Devin Schmidt, [name], MD. Schedule an appointment as soon as possible for a visit in 2 weeks.   Specialty:  General Surgery   Contact information:   [address] [place] [address] [phone]       Signed: Doreen Schmidt 02/03/2015, 8:17 AM

## 2015-02-03 NOTE — Progress Notes (Signed)
Devin Schmidt to be D/C'd Home per MD order.  Discussed with the patient and all questions fully answered.  VSS, Surgical incision sites clean, dry, intact with no sign of infection.  IV catheter discontinued intact. Site without signs and symptoms of complications. Dressing and pressure applied. Patient able to void prior to discharge.  An After Visit Summary was printed and given to the patient. Patient's wife received pain medication prescription on day of surgery.  D/c education completed with patient/family including follow up instructions, medication list, d/c activities limitations if indicated, with other d/c instructions as indicated by MD - patient able to verbalize understanding, all questions fully answered.   Patient instructed to return to ED, call 911, or call MD for any changes in condition.   Patient escorted, and D/C home via private auto.  [name] 02/03/2015 10:13 AM

## 2015-02-04 ENCOUNTER — Telehealth: Payer: Self-pay | Admitting: *Deleted

## 2015-02-06 NOTE — Telephone Encounter (Signed)
[name]  Jul 17, 1941 [id number]  Patient Care Team: Rosalita Chessman, DO as PCP - General Lowella Bandy, MD as Consulting Physician (Urology)  Filed: 02/02/2015 7:58 AM Note Time: 02/01/2015 8:46 AM Status: Signed    Editor: Doreen Salvage, MD (Physician)     Expand All Collapse All   OPERATIVE REPORT  DATE OF OPERATION: 02/01/2015  PATIENT: Devin Schmidt 74 y.o. male  PRE-OPERATIVE DIAGNOSIS: right inguinal hernia  POST-OPERATIVE DIAGNOSIS: right inguinal hernia, direct  PROCEDURE: Procedure(s): LAPAROSCOPIC RIGHT INGUINAL HERNIA REPAIR INSERTION OF MESH  SURGEON: Surgeon(s): Doreen Salvage, MD      Patient called struggling, POD#5 s/p RIH repair.  Has large bruising/swelling at groin with a lot of pressure.  No drainage.  Patient is using hydrocodone for pain.  Does not feel it gets enough.  Trying to the ice packs.  He is urinating.  That was a struggle at first.  He has not had a bowel movement since surgery.  He is taking Colace and Mira lax once a day.  He is eating.  No nausea or vomiting.  No fevers or chills.  His groin and scrotum were rather purple and bruised.  He is not on blood thinners.  He is not taking aspirin.  He is not taking nonsteroidals.  He wishes to be seen as soon as possible.  I recommend he get his bowels moving.  Use double or triple Mira lax.  Add a laxative on top of that until things get started.  I recommend he get in the shower.  Consider warm soaks in the bathtub.  Consider switching to a heating pads from the ice pack.  See if that helps him feel better.  I recommend he take extra strength Tylenol rather regularly and use the hydrocodone on top of it.  I recommended he call in the morning when the office is open and we will get an appt for him that day.  He was hoping I could give him a specific time and see Dr. Hulen Skains first thing in the morning.  I cautioned him as it is the weekend, I do not have scheduling capability right now, but we would get him to  see one of [name] tomorrow.  Call back in AM when office is open.  I noted if things are unbearable, he go to the emergency room to get IV pain medications and have evaluation.  He think he could wait a day.    Patient Active Problem List   Diagnosis Date Noted  . Hematuria, [name] 02/02/2015  . S/P inguinal hernia repair 02/01/2015  . Epididymitis 01/03/2015  . Pain in right testicle 01/03/2015  . Right groin pain 01/03/2015  . Back pain 12/22/2014  . Bronchitis with bronchospasm 11/16/2014  . Personal history of colonic polyps-adenomas 04/15/2014  . Unspecified constipation 12/24/2013  . Hemorrhoids 12/24/2013  . Essential tremor 11/20/2012  . Unspecified hereditary and idiopathic peripheral neuropathy 11/20/2012  . Tremor, essential 04/30/2011  . LYMPHOPENIA 12/08/2010  . 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 SEBORRHEIC KERATOSIS, INFLAMED 08/15/2007    Past Medical History  Diagnosis Date  . URI (upper respiratory infection)   . Inflamed seborrheic keratosis   . Hemochromatosis     HX OF  . Colon polyps     Tubular Adenoma and Hyperplastic     Past Surgical History  Procedure Laterality Date  . Colonoscopy w/ biopsies  10/20/2008    X 2  . Hemorrhoid banding    . Inguinal hernia repair  Right 02/01/2015  . Appendectomy  1950's  . Tonsillectomy  1950's  . Inguinal hernia repair N/A 02/01/2015    Procedure: LAPAROSCOPIC RIGHT INGUINAL HERNIA REPAIR;  Surgeon: Doreen Salvage, MD;  Location: [place];  Service: General;  Laterality: N/A;  . Insertion of mesh Right 02/01/2015    Procedure: INSERTION OF MESH;  Surgeon: Doreen Salvage, MD;  Location: [place];  Service: General;  Laterality: Right;    History   Social History  . Marital Status: Divorced    Spouse Name: N/A  . Number of Children: 1  . Years of Education: N/A   Occupational History  . RETIRED    Social History Main Topics  . Smoking status: Former Smoker -- 1.00 packs/day for 50 years    Types: Cigarettes    Quit  date: 08/14/2009  . Smokeless tobacco: Never Used  . Alcohol Use: 1.0 oz/week    2 Standard drinks or equivalent per week     Comment: 02/01/2015 rare (one time per month)  . Drug Use: No  . Sexual Activity: No   Other Topics Concern  . Not on file   Social History Narrative   Exercise-- , except golf, swims     Family History  Problem Relation Age of Onset  . Colon cancer Neg Hx   . Pancreatic cancer Neg Hx   . Rectal cancer Neg Hx   . Stomach cancer Neg Hx     Current Outpatient Prescriptions  Medication Sig Dispense Refill  . albuterol (PROVENTIL HFA;VENTOLIN HFA) 108 (90 BASE) MCG/ACT inhaler Inhale 2 puffs into the lungs every 6 (six) hours as needed for wheezing or shortness of breath. (Patient not taking: Reported on 01/20/2015) 1 Inhaler 0  . budesonide-formoterol (SYMBICORT) 160-4.5 MCG/ACT inhaler Inhale 2 puffs into the lungs 2 (two) times daily. (Patient not taking: Reported on 01/20/2015) 1 Inhaler 0  . cyclobenzaprine (FLEXERIL) 5 MG tablet Take 1 tablet (5 mg total) by mouth at bedtime. (Patient not taking: Reported on 01/20/2015) 7 tablet 0  . diclofenac (VOLTAREN) 75 MG EC tablet Take 1 tablet (75 mg total) by mouth 2 (two) times daily. (Patient not taking: Reported on 01/20/2015) 30 tablet 0  . docusate sodium (COLACE) 100 MG capsule Take 1 capsule (100 mg total) by mouth 2 (two) times daily. 10 capsule 0  . HYDROcodone-acetaminophen (NORCO/VICODIN) 5-325 MG per tablet Take 1-2 tablets by mouth every 4 (four) hours as needed for moderate pain. 30 tablet 0  . levofloxacin (LEVAQUIN) 500 MG tablet Take 1 tablet (500 mg total) by mouth daily. (Patient not taking: Reported on 01/20/2015) 7 tablet 0  . polyethylene glycol (MIRALAX / GLYCOLAX) packet Take 17 g by mouth daily as needed for moderate constipation. 14 each 0  . predniSONE (DELTASONE) 10 MG tablet One tab by mouth daily for 3 additional days. (Patient not taking: Reported on 01/20/2015) 20 tablet 0  . primidone  (MYSOLINE) 50 MG tablet Take 1 tablet (50 mg total) by mouth daily. 90 tablet 1  . tamsulosin (FLOMAX) 0.4 MG CAPS capsule Take 1 capsule (0.4 mg total) by mouth daily after supper. 30 capsule 0  . traMADol (ULTRAM) 50 MG tablet Take 1 tablet (50 mg total) by mouth every 6 (six) hours as needed for severe pain. (Patient not taking: Reported on 01/20/2015) 8 tablet 0   No current facility-administered medications for this visit.     No Known Allergies  @VS @  No results found.  Note: This dictation was prepared with  Dragon/digital dictation along with [employer]. Any transcriptional errors that result from this process are unintentional.

## 2015-03-09 ENCOUNTER — Telehealth: Payer: Self-pay | Admitting: Family Medicine

## 2015-03-09 NOTE — Telephone Encounter (Signed)
Caller name:Othal Relationship to patient:SELF Can be reached:[phone] [facility]acy: [facility]  Reason for call:3 MONTH SUPPLY  TRIMIDONE 50 MG TAKE 1 PER DAY

## 2015-03-09 NOTE — Telephone Encounter (Signed)
Patient has been made aware that his previous Rx had refills and he can call the pharmacy to refill them. He verbalized understanding.     KP

## 2015-03-24 ENCOUNTER — Other Ambulatory Visit (INDEPENDENT_AMBULATORY_CARE_PROVIDER_SITE_OTHER): Payer: Medicare Other

## 2015-03-24 LAB — HEMOGLOBIN: Hemoglobin: 14.8 g/dL (ref 13.0–17.0)

## 2015-03-24 LAB — HEMATOCRIT: HCT: 43.8 % (ref 39.0–52.0)

## 2015-03-25 NOTE — Progress Notes (Signed)
Quick Note:  Continue q 2 month phlebotomy ______

## 2015-04-29 ENCOUNTER — Encounter: Payer: Self-pay | Admitting: Medical

## 2015-04-29 ENCOUNTER — Ambulatory Visit (INDEPENDENT_AMBULATORY_CARE_PROVIDER_SITE_OTHER): Payer: Medicare Other | Admitting: Medical

## 2015-04-29 VITALS — BP 114/72 | HR 80 | Temp 98.5°F | Ht 72.0 in | Wt 181.2 lb

## 2015-04-29 DIAGNOSIS — J0100 Acute maxillary sinusitis, unspecified: Secondary | ICD-10-CM

## 2015-04-29 HISTORY — DX: Acute maxillary sinusitis, unspecified: J01.00

## 2015-04-29 MED ORDER — AZITHROMYCIN 250 MG PO TABS: ORAL_TABLET | ORAL | Status: DC

## 2015-04-29 MED ORDER — FLUTICASONE PROPIONATE 50 MCG/ACT NA SUSP: 2.0000 | Freq: Every day | NASAL | Status: DC

## 2015-04-29 MED ORDER — BENZONATATE 100 MG PO CAPS: 100.0000 mg | ORAL_CAPSULE | Freq: Three times a day (TID) | ORAL | Status: DC | PRN

## 2015-04-29 NOTE — Progress Notes (Signed)
Pre visit review using our clinic review tool, if applicable. No additional management support is needed unless otherwise documented below in the visit note. 

## 2015-04-29 NOTE — Assessment & Plan Note (Addendum)
Sinusitis with possible allergic rhinitis component. Rx flonase nasal spray, azithromycin antibiotic and benzonatate for cough.

## 2015-04-29 NOTE — Patient Instructions (Addendum)
Sinusitis, acute maxillary Sinusitis with possible allergic rhinitis component. Rx flonase nasal spray, azithromycin antibiotic and benzonatate for cough.      Follow up 7 days or as needed.

## 2015-04-29 NOTE — Progress Notes (Signed)
Subjective:    Patient ID: Devin Schmidt, male    DOB: [birth year], 74 y.o.   MRN: [id number]  HPI  Pt in with 3 days of nasal congestion. Now having a lot cough yesterday with runny nose. Pt has some mucous when he coughs. Pt does report some sinus pressure. Some sneezing today. No fevers, no chills or sweats.  Review of Systems  Constitutional: Negative for fever, chills, activity change and fatigue.  HENT: Positive for congestion, sinus pressure and sneezing.   Respiratory: Positive for cough. Negative for chest tightness and shortness of breath.   Cardiovascular: Negative for chest pain, palpitations and leg swelling.  Gastrointestinal: Negative for nausea, vomiting and abdominal pain.  Musculoskeletal: Negative for back pain, neck pain and neck stiffness.  Neurological: Negative for dizziness, syncope, weakness and headaches.  Psychiatric/Behavioral: Negative for behavioral problems, confusion and agitation. The patient is not nervous/anxious.     Past Medical History  Diagnosis Date  . URI (upper respiratory infection)   . Inflamed seborrheic keratosis   . Hemochromatosis     HX OF  . Colon polyps     Tubular Adenoma and Hyperplastic     History   Social History  . Marital Status: Divorced    Spouse Name: N/A  . Number of Children: 1  . Years of Education: N/A   Occupational History  . RETIRED    Social History Main Topics  . Smoking status: Former Smoker -- 1.00 packs/day for 50 years    Types: Cigarettes    Quit date: 08/14/2009  . Smokeless tobacco: Never Used  . Alcohol Use: 1.0 oz/week    2 Standard drinks or equivalent per week     Comment: 02/01/2015 rare (one time per month)  . Drug Use: No  . Sexual Activity: No   Other Topics Concern  . Not on file   Social History Narrative   Exercise-- , except golf, swims     Past Surgical History  Procedure Laterality Date  . Colonoscopy w/ biopsies  10/20/2008    X 2  . Hemorrhoid banding    .  Inguinal hernia repair Right 02/01/2015  . Appendectomy  1950's  . Tonsillectomy  1950's  . Inguinal hernia repair N/A 02/01/2015    Procedure: LAPAROSCOPIC RIGHT INGUINAL HERNIA REPAIR;  Surgeon: Doreen Salvage, MD;  Location: [place];  Service: General;  Laterality: N/A;  . Insertion of mesh Right 02/01/2015    Procedure: INSERTION OF MESH;  Surgeon: Doreen Salvage, MD;  Location: [place];  Service: General;  Laterality: Right;    Family History  Problem Relation Age of Onset  . Colon cancer Neg Hx   . Pancreatic cancer Neg Hx   . Rectal cancer Neg Hx   . Stomach cancer Neg Hx     No Known Allergies  Current Outpatient Prescriptions on File Prior to Visit  Medication Sig Dispense Refill  . primidone (MYSOLINE) 50 MG tablet Take 1 tablet (50 mg total) by mouth daily. 90 tablet 1  . budesonide-formoterol (SYMBICORT) 160-4.5 MCG/ACT inhaler Inhale 2 puffs into the lungs 2 (two) times daily. (Patient not taking: Reported on 01/20/2015) 1 Inhaler 0  . cyclobenzaprine (FLEXERIL) 5 MG tablet Take 1 tablet (5 mg total) by mouth at bedtime. (Patient not taking: Reported on 01/20/2015) 7 tablet 0   No current facility-administered medications on file prior to visit.    BP 114/72 mmHg  Pulse 80  Temp(Src) 98.5 F (36.9 C) (Oral)  Ht 6' (  1.829 m)  Wt 181 lb 3.2 oz (82.192 kg)  BMI 24.57 kg/m2  SpO2 98%       Objective:   Physical Exam  General  Mental Status - Alert. General Appearance - Well groomed. Not in acute distress.  Skin Rashes- No Rashes.  HEENT Head- Normal. Ear Auditory Canal - Left- Normal. Right - Normal.Tympanic Membrane- Left- Normal. Right- Normal. Eye Sclera/Conjunctiva- Left- Normal. Right- Normal. Nose & Sinuses Nasal Mucosa- Left-  Boggy and Congested. Right-  Boggy and  Congested.Bilateral maxillary faint pressure and frontal sinus pressure. Mouth & Throat Lips: Upper Lip- Normal: no dryness, cracking, pallor, cyanosis, or vesicular eruption. Lower Lip-Normal: no  dryness, cracking, pallor, cyanosis or vesicular eruption. Buccal Mucosa- Bilateral- No Aphthous ulcers. Oropharynx- No Discharge or Erythema. +pnd. Tonsils: Characteristics- Bilateral- No Erythema or Congestion. Size/Enlargement- Bilateral- No enlargement. Discharge- bilateral-None.  Neck Neck- Supple. No Masses.   Chest and Lung Exam Auscultation: Breath Sounds:-Clear even and unlabored.  Cardiovascular Auscultation:Rythm- Regular, rate and rhythm. Murmurs & Other Heart Sounds:Ausculatation of the heart reveal- No Murmurs.  Lymphatic Head & Neck General Head & Neck Lymphatics: Bilateral: Description- No Localized lymphadenopathy.      Assessment & Plan:

## 2015-05-24 ENCOUNTER — Other Ambulatory Visit (INDEPENDENT_AMBULATORY_CARE_PROVIDER_SITE_OTHER): Payer: Medicare Other

## 2015-05-24 LAB — HEMATOCRIT: HEMATOCRIT: 46.7 % (ref 39.0–52.0)

## 2015-05-24 LAB — HEMOGLOBIN: Hemoglobin: 16 g/dL (ref 13.0–17.0)

## 2015-05-24 NOTE — Progress Notes (Signed)
Quick Note:  Continue phlebotomy every 2 months ______

## 2015-06-08 ENCOUNTER — Telehealth: Payer: Self-pay | Admitting: Family Medicine

## 2015-06-08 MED ORDER — PRIMIDONE 50 MG PO TABS: 50.0000 mg | ORAL_TABLET | Freq: Every day | ORAL | Status: DC

## 2015-06-08 NOTE — Telephone Encounter (Signed)
°  Relation to ZY:SAYT Call back [phone] Pharmacy:[facility]  Reason for call: pt is needing rxprimidone (MYSOLINE) 50 MG tablet .

## 2015-07-22 ENCOUNTER — Ambulatory Visit (INDEPENDENT_AMBULATORY_CARE_PROVIDER_SITE_OTHER): Payer: Medicare Other | Admitting: Family Medicine

## 2015-07-22 ENCOUNTER — Encounter: Payer: Self-pay | Admitting: Family Medicine

## 2015-07-22 VITALS — BP 116/70 | HR 70 | Temp 98.2°F | Wt 183.8 lb

## 2015-07-22 DIAGNOSIS — G250 Essential tremor: Secondary | ICD-10-CM

## 2015-07-22 DIAGNOSIS — H6123 Impacted cerumen, bilateral: Secondary | ICD-10-CM | POA: Diagnosis not present

## 2015-07-22 DIAGNOSIS — H6120 Impacted cerumen, unspecified ear: Secondary | ICD-10-CM | POA: Insufficient documentation

## 2015-07-22 MED ORDER — PRIMIDONE 50 MG PO TABS: 100.0000 mg | ORAL_TABLET | Freq: Every day | ORAL | Status: DC

## 2015-07-22 NOTE — Progress Notes (Signed)
Pre visit review using our clinic review tool, if applicable. No additional management support is needed unless otherwise documented below in the visit note. 

## 2015-07-22 NOTE — Progress Notes (Signed)
Patient ID: Devin Schmidt, male    DOB: 1941-02-05  Age: 74 y.o. MRN: [id number]    Subjective:  Subjective HPI DIEGO ULBRICHT presents for c/o ears feeling plugged and d/c earring   Review of Systems  Constitutional: Negative for diaphoresis, appetite change, fatigue and unexpected weight change.  Eyes: Negative for pain, redness and visual disturbance.  Respiratory: Negative for cough, chest tightness, shortness of breath and wheezing.   Cardiovascular: Negative for chest pain, palpitations and leg swelling.  Endocrine: Negative for cold intolerance, heat intolerance, polydipsia, polyphagia and polyuria.  Genitourinary: Negative for dysuria, frequency and difficulty urinating.  Neurological: Negative for dizziness, light-headedness, numbness and headaches.    History Past Medical History  Diagnosis Date  . URI (upper respiratory infection)   . Inflamed seborrheic keratosis   . Hemochromatosis     HX OF  . Colon polyps     Tubular Adenoma and Hyperplastic     He has past surgical history that includes Colonoscopy w/ biopsies (10/20/2008); Hemorrhoid banding; Inguinal hernia repair (Right, 02/01/2015); Appendectomy (1950's); Tonsillectomy (1950's); Inguinal hernia repair (N/A, 02/01/2015); and Insertion of mesh (Right, 02/01/2015).   His family history is negative for Colon cancer, Pancreatic cancer, Rectal cancer, and Stomach cancer.He reports that he quit smoking about 5 years ago. His smoking use included Cigarettes. He has a 50 pack-year smoking history. He has never used smokeless tobacco. He reports that he drinks about 1.0 oz of alcohol per week. He reports that he does not use illicit drugs.  No current outpatient prescriptions on file prior to visit.   No current facility-administered medications on file prior to visit.     Objective:  Objective Physical Exam  Constitutional: He is oriented to person, place, and time. Vital signs are normal. He appears well-developed  and well-nourished. He is sleeping.  HENT:  Head: Normocephalic and atraumatic.  Right Ear: Decreased hearing is noted.  Left Ear: Decreased hearing is noted.  Mouth/Throat: Oropharynx is clear and moist.  Eyes: EOM are normal. Pupils are equal, round, and reactive to light.  Neck: Normal range of motion. Neck supple. No thyromegaly present.  Cardiovascular: Normal rate and regular rhythm.   No murmur heard. Pulmonary/Chest: Effort normal and breath sounds normal. No respiratory distress. He has no wheezes. He has no rales. He exhibits no tenderness.  Musculoskeletal: He exhibits no edema or tenderness.  Neurological: He is alert and oriented to person, place, and time.  Skin: Skin is warm and dry.  Psychiatric: He has a normal mood and affect. His behavior is normal. Judgment and thought content normal.   BP 116/70 mmHg  Pulse 70  Temp(Src) 98.2 F (36.8 C) (Oral)  Wt 183 lb 12.8 oz (83.371 kg)  SpO2 98% Wt Readings from Last 3 Encounters:  07/22/15 183 lb 12.8 oz (83.371 kg)  04/29/15 181 lb 3.2 oz (82.192 kg)  02/01/15 185 lb (83.915 kg)     Lab Results  Component Value Date   WBC 12.3* 02/02/2015   HGB 16.0 05/24/2015   HCT 46.7 05/24/2015   PLT 183 02/02/2015   GLUCOSE 97 02/03/2015   CHOL 197 09/20/2014   TRIG 186.0* 09/20/2014   HDL 45.80 09/20/2014   LDLCALC 114* 09/20/2014   ALT 15 09/20/2014   AST 16 09/20/2014   NA 140 02/03/2015   K 3.8 02/03/2015   CL 103 02/03/2015   CREATININE 1.31 02/03/2015   BUN 13 02/03/2015   CO2 28 02/03/2015   TSH 0.60 11/20/2012  PSA 1.25 09/20/2014    No results found.   Assessment & Plan:  Plan I have discontinued Mr. Dahir budesonide-formoterol, cyclobenzaprine, fluticasone, azithromycin, benzonatate, and primidone. I am also having him start on primidone.  Meds ordered this encounter  Medications  . primidone (MYSOLINE) 50 MG tablet    Sig: Take 2 tablets (100 mg total) by mouth at bedtime.    Dispense:  60  tablet    Refill:  5    Problem List Items Addressed This Visit    Essential tremor - Primary   Relevant Medications   primidone (MYSOLINE) 50 MG tablet   Cerumen impaction    Irrigated successfully  rto prn         Follow-up: No Follow-up on file.  Garnet Koyanagi, DO

## 2015-07-22 NOTE — Assessment & Plan Note (Signed)
Irrigated successfully  rto prn 

## 2015-07-26 ENCOUNTER — Other Ambulatory Visit (INDEPENDENT_AMBULATORY_CARE_PROVIDER_SITE_OTHER): Payer: Medicare Other

## 2015-07-26 LAB — HEMOGLOBIN: Hemoglobin: 16.2 g/dL (ref 13.0–17.0)

## 2015-07-26 LAB — HEMATOCRIT: HCT: 48 % (ref 39.0–52.0)

## 2015-07-27 NOTE — Progress Notes (Signed)
Quick Note:  Repeat phlebotomy in 2 months please ______

## 2015-09-26 ENCOUNTER — Other Ambulatory Visit (INDEPENDENT_AMBULATORY_CARE_PROVIDER_SITE_OTHER): Payer: Medicare Other

## 2015-09-26 LAB — HEMOGLOBIN: HEMOGLOBIN: 15.9 g/dL (ref 13.0–17.0)

## 2015-09-26 LAB — HEMATOCRIT: HEMATOCRIT: 48.1 % (ref 39.0–52.0)

## 2015-09-27 NOTE — Progress Notes (Signed)
Quick Note:  Repeat phlebotomy in 2 months Check ferritin then also ______

## 2015-09-28 ENCOUNTER — Other Ambulatory Visit: Payer: Self-pay

## 2015-11-01 ENCOUNTER — Encounter: Payer: Self-pay | Admitting: Internal Medicine

## 2015-11-01 ENCOUNTER — Ambulatory Visit (INDEPENDENT_AMBULATORY_CARE_PROVIDER_SITE_OTHER): Payer: Medicare Other | Admitting: Internal Medicine

## 2015-11-01 VITALS — BP 114/68 | HR 72 | Temp 97.6°F | Ht 72.0 in | Wt 182.1 lb

## 2015-11-01 DIAGNOSIS — J209 Acute bronchitis, unspecified: Secondary | ICD-10-CM | POA: Diagnosis not present

## 2015-11-01 MED ORDER — AZITHROMYCIN 250 MG PO TABS: ORAL_TABLET | ORAL | Status: DC

## 2015-11-01 NOTE — Patient Instructions (Signed)
Rest, fluids , tylenol  For cough:  Take Mucinex DM twice a day as needed until better If you are coughing or have chest congestion: Use albuterol 2 puffs 4 times a day as needed  For nasal congestion: Use OTC Nasocort or Flonase : 2 nasal sprays on each side of the nose in the morning until you feel better   Avoid decongestants such as  Pseudoephedrine or phenylephrine     Take the antibiotic as prescribed  (zithromax) if you are not better in the next 3 days  Call if not gradually better over the next  10 days  Call anytime if the symptoms are severe

## 2015-11-01 NOTE — Progress Notes (Signed)
Subjective:    Patient ID: Devin Schmidt, male    DOB: Aug 30, 1941, 75 y.o.   MRN: [id number]  DOS:  11/01/2015 Type of visit - description : Acute Interval history: Sx started a week ago with a "tickle in the throat" that caused cough. At this point cough has decreased, has no sputum production however he is now congested at the  chest and sinuses.   Review of Systems  No fever chills No nausea, vomiting, diarrhea. No myalgias. Mild sore throat Occasional wheezing. Has used a leftover albuterol with some help.  Past Medical History  Diagnosis Date  . URI (upper respiratory infection)   . Inflamed seborrheic keratosis   . Hemochromatosis     HX OF  . Colon polyps     Tubular Adenoma and Hyperplastic     Past Surgical History  Procedure Laterality Date  . Colonoscopy w/ biopsies  10/20/2008    X 2  . Hemorrhoid banding    . Inguinal hernia repair Right 02/01/2015  . Appendectomy  1950's  . Tonsillectomy  1950's  . Inguinal hernia repair N/A 02/01/2015    Procedure: LAPAROSCOPIC RIGHT INGUINAL HERNIA REPAIR;  Surgeon: Doreen Salvage, MD;  Location: [place];  Service: General;  Laterality: N/A;  . Insertion of mesh Right 02/01/2015    Procedure: INSERTION OF MESH;  Surgeon: Doreen Salvage, MD;  Location: [place];  Service: General;  Laterality: Right;    Social History   Social History  . Marital Status: Divorced    Spouse Name: N/A  . Number of Children: 1  . Years of Education: N/A   Occupational History  . RETIRED    Social History Main Topics  . Smoking status: Former Smoker -- 1.00 packs/day for 50 years    Types: Cigarettes    Quit date: 08/14/2009  . Smokeless tobacco: Never Used  . Alcohol Use: 1.0 oz/week    2 Standard drinks or equivalent per week     Comment: 02/01/2015 rare (one time per month)  . Drug Use: No  . Sexual Activity: No   Other Topics Concern  . Not on file   Social History Narrative   Exercise-- , except golf, swims         Medication List         This list is accurate as of: 11/01/15 11:59 PM.  Always use your most recent med list.               azithromycin 250 MG tablet  Commonly known as:  ZITHROMAX Z-PAK  2 tabs a day the first day, then 1 tab a day x 4 days     primidone 50 MG tablet  Commonly known as:  MYSOLINE  Take 2 tablets (100 mg total) by mouth at bedtime.           Objective:   Physical Exam BP 114/68 mmHg  Pulse 72  Temp(Src) 97.6 F (36.4 C) (Oral)  Ht 6' (1.829 m)  Wt 182 lb 2 oz (82.611 kg)  BMI 24.70 kg/m2  SpO2 96% General:   Well developed, well nourished . NAD.  HEENT:  Normocephalic . Face symmetric, atraumatic. TMs obscured by dry wax. Throat symmetric, no red Nose is slightly congested, sinuses no TTP Lungs:  CTA B except for very few rhonchi. No wheezing. Normal respiratory effort, no intercostal retractions, no accessory muscle use. Heart: RRR,  no murmur.  No pretibial edema bilaterally  Skin: Not pale. Not jaundice Neurologic:  alert & oriented X3.  Speech normal, gait appropriate for age and unassisted Psych--  Cognition and judgment appear intact.  Cooperative with normal attention span and concentration.  Behavior appropriate. No anxious or depressed appearing.      Assessment & Plan:   Bronchitis: Symptoms consistent with bronchitis, he is a former smoker , occasionally hears wheezing. Plan: Mucinex DM, albuterol when necessary, Flonase. If not better start a zpack. See instructions.

## 2015-11-01 NOTE — Progress Notes (Signed)
Pre visit review using our clinic review tool, if applicable. No additional management support is needed unless otherwise documented below in the visit note. 

## 2015-11-23 ENCOUNTER — Encounter: Payer: Self-pay | Admitting: Behavioral Health

## 2015-11-23 ENCOUNTER — Telehealth: Payer: Self-pay | Admitting: Behavioral Health

## 2015-11-23 NOTE — Telephone Encounter (Signed)
Pre-Visit Call completed with patient and chart updated.   Pre-Visit Info documented in Specialty Comments under [employer].    

## 2015-11-24 ENCOUNTER — Ambulatory Visit (HOSPITAL_BASED_OUTPATIENT_CLINIC_OR_DEPARTMENT_OTHER)
Admission: RE | Admit: 2015-11-24 | Discharge: 2015-11-24 | Disposition: A | Discharge status: Home / Self Care | Payer: Medicare Other | Source: Ambulatory Visit | Attending: Family Medicine | Admitting: Family Medicine

## 2015-11-24 ENCOUNTER — Encounter: Payer: Self-pay | Admitting: Family Medicine

## 2015-11-24 ENCOUNTER — Ambulatory Visit (INDEPENDENT_AMBULATORY_CARE_PROVIDER_SITE_OTHER): Payer: Medicare Other | Admitting: Family Medicine

## 2015-11-24 VITALS — BP 115/76 | HR 57 | Temp 97.5°F | Ht 72.0 in | Wt 185.8 lb

## 2015-11-24 DIAGNOSIS — G250 Essential tremor: Secondary | ICD-10-CM

## 2015-11-24 DIAGNOSIS — Z87891 Personal history of nicotine dependence: Secondary | ICD-10-CM | POA: Diagnosis not present

## 2015-11-24 DIAGNOSIS — Z0001 Encounter for general adult medical examination with abnormal findings: Secondary | ICD-10-CM | POA: Diagnosis not present

## 2015-11-24 DIAGNOSIS — Z0000 Encounter for general adult medical examination without abnormal findings: Secondary | ICD-10-CM

## 2015-11-24 DIAGNOSIS — Z125 Encounter for screening for malignant neoplasm of prostate: Secondary | ICD-10-CM

## 2015-11-24 DIAGNOSIS — R079 Chest pain, unspecified: Secondary | ICD-10-CM

## 2015-11-24 LAB — CBC WITH DIFFERENTIAL/PLATELET
BASOS PCT: 0.3 % (ref 0.0–3.0)
Basophils Absolute: 0 10*3/uL (ref 0.0–0.1)
EOS ABS: 0.2 10*3/uL (ref 0.0–0.7)
EOS PCT: 3.1 % (ref 0.0–5.0)
HCT: 49 % (ref 39.0–52.0)
Hemoglobin: 16.5 g/dL (ref 13.0–17.0)
LYMPHS ABS: 1.4 10*3/uL (ref 0.7–4.0)
Lymphocytes Relative: 18.1 % (ref 12.0–46.0)
MCHC: 33.7 g/dL (ref 30.0–36.0)
MCV: 99.2 fl (ref 78.0–100.0)
MONO ABS: 0.8 10*3/uL (ref 0.1–1.0)
Monocytes Relative: 10.8 % (ref 3.0–12.0)
NEUTROS PCT: 67.7 % (ref 43.0–77.0)
Neutro Abs: 5.2 10*3/uL (ref 1.4–7.7)
PLATELETS: 146 10*3/uL — AB (ref 150.0–400.0)
RBC: 4.93 Mil/uL (ref 4.22–5.81)
RDW: 13.3 % (ref 11.5–15.5)
WBC: 7.7 10*3/uL (ref 4.0–10.5)

## 2015-11-24 LAB — POCT URINALYSIS DIPSTICK
Bilirubin, UA: NEGATIVE
Blood, UA: NEGATIVE
GLUCOSE UA: NEGATIVE
Ketones, UA: NEGATIVE
LEUKOCYTES UA: NEGATIVE
NITRITE UA: NEGATIVE
PROTEIN UA: NEGATIVE
SPEC GRAV UA: 1.02
UROBILINOGEN UA: 0.2
pH, UA: 7

## 2015-11-24 LAB — COMPREHENSIVE METABOLIC PANEL
ALK PHOS: 93 U/L (ref 39–117)
ALT: 16 U/L (ref 0–53)
AST: 17 U/L (ref 0–37)
Albumin: 4.3 g/dL (ref 3.5–5.2)
BILIRUBIN TOTAL: 0.7 mg/dL (ref 0.2–1.2)
BUN: 15 mg/dL (ref 6–23)
CO2: 32 meq/L (ref 19–32)
Calcium: 9.4 mg/dL (ref 8.4–10.5)
Chloride: 101 mEq/L (ref 96–112)
Creatinine, Ser: 1.22 mg/dL (ref 0.40–1.50)
GFR: 61.22 mL/min (ref >60.00)
GLUCOSE: 90 mg/dL (ref 70–99)
Potassium: 4.6 mEq/L (ref 3.5–5.1)
SODIUM: 139 meq/L (ref 135–145)
Total Protein: 6.8 g/dL (ref 6.0–8.3)

## 2015-11-24 LAB — LIPID PANEL
CHOLESTEROL: 190 mg/dL (ref 0–200)
HDL: 60.8 mg/dL (ref >39.00)
LDL Cholesterol: 95 mg/dL (ref 0–99)
NonHDL: 129.28
TRIGLYCERIDES: 171 mg/dL — AB (ref 0.0–149.0)
Total CHOL/HDL Ratio: 3
VLDL: 34.2 mg/dL (ref 0.0–40.0)

## 2015-11-24 LAB — PSA: PSA: 1.06 ng/mL (ref 0.10–4.00)

## 2015-11-24 MED ORDER — PRIMIDONE 50 MG PO TABS: 100.0000 mg | ORAL_TABLET | Freq: Every day | ORAL | Status: DC

## 2015-11-24 NOTE — Progress Notes (Signed)
-0  Subjective:   Devin Schmidt is a 75 y.o. male who presents for Medicare Annual/Subsequent preventive examination.  Review of Systems:   Review of Systems  Constitutional: Negative for activity change, appetite change and fatigue.  HENT: Negative for hearing loss, congestion, tinnitus and ear discharge.   Eyes: Negative for visual disturbance (see optho q1y -- vision corrected to 20/20 with glasses).  Respiratory: Negative for cough, chest tightness and shortness of breath.   Cardiovascular: Negative for, palpitations and leg swelling.  + chest pain x 2 episodes Gastrointestinal: Negative for abdominal pain, diarrhea, constipation and abdominal distention.  Genitourinary: Negative for urgency, frequency, decreased urine volume and difficulty urinating.  Musculoskeletal: Negative for back pain, arthralgias and gait problem.  Skin: Negative for color change, pallor and rash.  Neurological: Negative for dizziness, light-headedness, numbness and headaches.  Hematological: Negative for adenopathy. Does not bruise/bleed easily.  Psychiatric/Behavioral: Negative for suicidal ideas, confusion, sleep disturbance, self-injury, dysphoric mood, decreased concentration and agitation.  Pt is able to read and write and can do all ADLs No risk for falling No abuse/ violence in home          Objective:    Vitals: BP 115/76 mmHg  Pulse 57  Temp(Src) 97.5 F (36.4 C) (Oral)  Ht 6' (1.829 m)  Wt 185 lb 12.8 oz (84.278 kg)  BMI 25.19 kg/m2  SpO2 100% BP 115/76 mmHg  Pulse 57  Temp(Src) 97.5 F (36.4 C) (Oral)  Ht 6' (1.829 m)  Wt 185 lb 12.8 oz (84.278 kg)  BMI 25.19 kg/m2  SpO2 100% General appearance: alert, cooperative, appears stated age and no distress Head: Normocephalic, without obvious abnormality, atraumatic Eyes: conjunctivae/corneas clear. PERRL, EOM's intact. Fundi benign. Ears: normal TM's and external ear canals both ears Nose: Nares normal. Septum midline. Mucosa  normal. No drainage or sinus tenderness. Throat: lips, mucosa, and tongue normal; teeth and gums normal Neck: no adenopathy, supple, symmetrical, trachea midline and thyroid not enlarged, symmetric, no tenderness/mass/nodules Back: symmetric, no curvature. ROM normal. No CVA tenderness. Lungs: clear to auscultation bilaterally Chest wall: no tenderness Heart: regular rate and rhythm, S1, S2 normal, no murmur, click, rub or gallop Abdomen: soft, non-tender; bowel sounds normal; no masses,  no organomegaly Male genitalia: deferred -- urology per pr Rectal: deferred-- urology per pt Extremities: extremities normal, atraumatic, no cyanosis or edema Pulses: 2+ and symmetric Skin: Skin color, texture, turgor normal. No rashes or lesions Lymph nodes: Cervical, supraclavicular, and axillary nodes normal. Neurologic: Alert and oriented X 3, normal strength and tone. Normal symmetric reflexes. Normal coordination and gait Psych- no depression, no anxiety Tobacco History  Smoking status  . Former Smoker -- 1.00 packs/day for 50 years  . Types: Cigarettes  . Quit date: 08/14/2009  Smokeless tobacco  . Never Used     Counseling given: Not Answered   Past Medical History  Diagnosis Date  . URI (upper respiratory infection)   . Inflamed seborrheic keratosis   . Hemochromatosis     HX OF  . Colon polyps     Tubular Adenoma and Hyperplastic    Past Surgical History  Procedure Laterality Date  . Colonoscopy w/ biopsies  10/20/2008    X 2  . Hemorrhoid banding    . Inguinal hernia repair Right 02/01/2015  . Appendectomy  1950's  . Tonsillectomy  1950's  . Inguinal hernia repair N/A 02/01/2015    Procedure: LAPAROSCOPIC RIGHT INGUINAL HERNIA REPAIR;  Surgeon: Doreen Salvage, MD;  Location: [place];  Service: General;  Laterality: N/A;  . Insertion of mesh Right 02/01/2015    Procedure: INSERTION OF MESH;  Surgeon: Doreen Salvage, MD;  Location: [place];  Service: General;  Laterality: Right;   Family  History  Problem Relation Age of Onset  . Colon cancer Neg Hx   . Pancreatic cancer Neg Hx   . Rectal cancer Neg Hx   . Stomach cancer Neg Hx    History  Sexual Activity  . Sexual Activity: No    Outpatient Encounter Prescriptions as of 11/24/2015  Medication Sig  . primidone (MYSOLINE) 50 MG tablet Take 2 tablets (100 mg total) by mouth at bedtime.  . [DISCONTINUED] primidone (MYSOLINE) 50 MG tablet Take 2 tablets (100 mg total) by mouth at bedtime.   No facility-administered encounter medications on file as of 11/24/2015.    Activities of Daily Living In your present state of health, do you have any difficulty performing the following activities: 11/24/2015 11/24/2015  Hearing? N N  Vision? N N  Difficulty concentrating or making decisions? N Y  Walking or climbing stairs? N N  Dressing or bathing? N N  Doing errands, shopping? N N    Patient Care Team: Rosalita Chessman, DO as PCP - General Lowella Bandy, MD as Consulting Physician (Urology) Gatha Mayer, MD as Consulting Physician (Gastroenterology)   Assessment:    Cpe.   Exercise Activities and Dietary recommendations    Goals    None     Fall Risk Fall Risk  11/24/2015 11/24/2015 09/20/2014 09/20/2014 09/09/2013  Falls in the past year? _0    Depression Screen PHQ 2/9 Scores 11/24/2015 11/24/2015 09/20/2014 09/20/2014  PHQ - 2 Score 0 0 0 0    Cognitive Testing mmse 30/30  There is no immunization history for the selected administration types on file for this patient. Screening Tests Health Maintenance  Topic Date Due  . INFLUENZA VACCINE  01/20/2016 (Originally 05/23/2015)  . PNA vac Low Risk Adult (1 of 2 - PCV13) 11/23/2016 (Originally 11/20/2003)  . COLONOSCOPY  04/15/2017  . TETANUS/TDAP  08/14/2021  . ZOSTAVAX  Addressed      Plan:    During the course of the visit the patient was educated and counseled about the following appropriate screening and preventive services:   Vaccines to include  Pneumoccal, Influenza, Hepatitis B, Td, Zostavax, HCV  Electrocardiogram  Cardiovascular Disease  Colorectal cancer screening  Diabetes screening  Prostate Cancer Screening  Glaucoma screening  Nutrition counseling   Smoking cessation counseling  Patient Instructions (the written plan) was given to the patient.  1. Essential tremor  - primidone (MYSOLINE) 50 MG tablet; Take 2 tablets (100 mg total) by mouth at bedtime.  Dispense: 180 tablet; Refill: 3 - CBC with Differential/Platelet - Lipid panel - POCT urinalysis dipstick - PSA - Comp Met (CMET)  2. Chest pain, unspecified chest pain type ekg-- no acute changes - EKG 12-Lead - DG Chest 2 View; Future - CBC with Differential/Platelet - Lipid panel - POCT urinalysis dipstick - PSA - Comp Met (CMET)  3. Preventative health care   4. Routine history and physical examination of adult   5. Former smoker  - Ambulatory Referral for [place], DO  11/26/2015

## 2015-11-24 NOTE — Patient Instructions (Signed)

## 2015-11-24 NOTE — Progress Notes (Signed)
Pre visit review using our clinic review tool, if applicable. No additional management support is needed unless otherwise documented below in the visit note. 

## 2015-11-28 ENCOUNTER — Other Ambulatory Visit (INDEPENDENT_AMBULATORY_CARE_PROVIDER_SITE_OTHER): Payer: Medicare Other

## 2015-11-28 ENCOUNTER — Other Ambulatory Visit: Payer: Self-pay

## 2015-11-28 LAB — HEMATOCRIT: HEMATOCRIT: 48.7 % (ref 39.0–52.0)

## 2015-11-28 LAB — HEMOGLOBIN: Hemoglobin: 15.9 g/dL (ref 13.0–17.0)

## 2015-11-28 NOTE — Progress Notes (Signed)
Quick Note:  Repeat phlebotomy 2 months and do ferritin ______

## 2015-12-05 ENCOUNTER — Other Ambulatory Visit: Payer: Self-pay | Admitting: Acute Care

## 2015-12-05 DIAGNOSIS — Z87891 Personal history of nicotine dependence: Secondary | ICD-10-CM

## 2015-12-06 ENCOUNTER — Ambulatory Visit (INDEPENDENT_AMBULATORY_CARE_PROVIDER_SITE_OTHER)
Admission: RE | Admit: 2015-12-06 | Discharge: 2015-12-06 | Disposition: A | Discharge status: Home / Self Care | Payer: Medicare Other | Source: Ambulatory Visit | Attending: Acute Care | Admitting: Acute Care

## 2015-12-06 ENCOUNTER — Telehealth: Payer: Self-pay | Admitting: Acute Care

## 2015-12-06 ENCOUNTER — Encounter: Payer: Self-pay | Admitting: Acute Care

## 2015-12-06 ENCOUNTER — Ambulatory Visit (INDEPENDENT_AMBULATORY_CARE_PROVIDER_SITE_OTHER): Payer: Medicare Other | Admitting: Acute Care

## 2015-12-06 DIAGNOSIS — Z87891 Personal history of nicotine dependence: Secondary | ICD-10-CM

## 2015-12-06 NOTE — Progress Notes (Signed)
Shared Decision Making Visit Lung Cancer Screening Program [phone])   Eligibility:  Age 75 y.o.  Pack Years Smoking History Calculation 40-pack-year smoker (# packs/per year x # years smoked)  Recent History of coughing up bloodno  Unexplained weight loss? no ( >Than 15 pounds within the last 6 months )  Prior History Lung / other cancer no (Diagnosis within the last 5 years already requiring surveillance chest CT Scans).  Smoking Status Former Smoker  Former Smokers: Years since quit: 3 years  Quit Date: 2014  Visit Components:  Discussion included one or more decision making aids. yes  Discussion included risk/benefits of screening. yes  Discussion included potential follow up diagnostic testing for abnormal scans. yes  Discussion included meaning and risk of over diagnosis. yes  Discussion included meaning and risk of False Positives. yes  Discussion included meaning of total radiation exposure. yes  Counseling Included:  Importance of adherence to annual lung cancer LDCT screening. yes  Impact of comorbidities on ability to participate in the program. yes  Ability and willingness to under diagnostic treatment. yes  Smoking Cessation Counseling:  Current Smokers:   Discussed importance of smoking cessation. Not applicable former smoker  Information about tobacco cessation classes and interventions provided to patient. Not applicable former smoker  Patient provided with "ticket" for LDCT Scan. yes  Symptomatic Patient. no  Counseling not applicable  Diagnosis Code: Tobacco Use Z72.0  Asymptomatic Patient yes  Counseling; not applicable former smoker  Former Smokers:   Discussed the importance of maintaining cigarette abstinence. yes  Diagnosis Code: Personal History of Nicotine Dependence. [id number]  Information about tobacco cessation classes and interventions provided to patient. Yes  Patient provided with "ticket" for LDCT Scan. yes  Written  Order for Lung Cancer Screening with LDCT placed in Epic. Yes (CT Chest Lung Cancer Screening Low Dose W/O CM) [id number] Z12.2-Screening of respiratory organs Z87.891-Personal history of nicotine dependence  I spent 15 minutes of face to face time with Devin Schmidt discussing the risks and benefits of lung cancer screening. We viewed a power point together that explained in detail the above noted topics. We took the time to pause the power point at intervals to allow for questions to be asked and answered to ensure understanding. We discussed that he had taken the single most powerful action possible to decrease his risk of developing lung cancer when he quit smoking. I counseled him to remain smoke free, and to contact me if he ever had the desire to smoke again so that I can provide resources and tools to help support the effort to remain smoke free. We discussed the time and location of the scan, and that either [place] or I will call with the results within  24-48 hours of receiving them. He has my card and contact information in the event he needs to speak with me, in addition to a copy of the power point we reviewed as a resource. Devin Schmidt  verbalized understanding of all of the above and had no further questions upon leaving the office.    Magdalen Spatz, NP

## 2015-12-06 NOTE — Telephone Encounter (Signed)
Dr. Etter Sjogren, I have called this patient with the results of his low-dose CT scan. It resulted in a lung RADS 2 scan, nodules with a benign in appearance and behavior. Recommendation is for a repeat CT in 12 months which we will call and schedule in February 2018. Incidental findings on the scan included emphysema aortic atherosclerosis and coronary artery calcification, prior granulomatous disease, and gallstones. Severity or degree cannot be determined based on this non-gated exam.  The patient told me that he has just recently had a physical and that his cholesterol was within normal limits. I will defer to you to make decisions regarding any future follow-up or care as you know Mr. Zehm medical history well. Please review the scan in Epic. Don't hesitate to call if you have any questions or concerns. As always thank you so much for the referral.  Thank you,  Eric Form  Message to Dr. Etter Sjogren 12/06/15.

## 2016-01-02 ENCOUNTER — Encounter: Payer: Self-pay | Admitting: Family Medicine

## 2016-01-02 ENCOUNTER — Ambulatory Visit (INDEPENDENT_AMBULATORY_CARE_PROVIDER_SITE_OTHER): Payer: Medicare Other | Admitting: Family Medicine

## 2016-01-02 VITALS — BP 111/73 | HR 63 | Temp 98.1°F | Ht 72.0 in | Wt 185.2 lb

## 2016-01-02 DIAGNOSIS — K648 Other hemorrhoids: Secondary | ICD-10-CM | POA: Diagnosis not present

## 2016-01-02 DIAGNOSIS — K649 Unspecified hemorrhoids: Secondary | ICD-10-CM

## 2016-01-02 DIAGNOSIS — K644 Residual hemorrhoidal skin tags: Secondary | ICD-10-CM

## 2016-01-02 NOTE — Patient Instructions (Signed)

## 2016-01-02 NOTE — Progress Notes (Signed)
Pre visit review using our clinic review tool, if applicable. No additional management support is needed unless otherwise documented below in the visit note. 

## 2016-01-02 NOTE — Progress Notes (Signed)
Patient ID: Devin Schmidt, male    DOB: 1941/04/08  Age: 75 y.o. MRN: [id number]    Subjective:  Subjective HPI Devin Schmidt presents for hemorrhoid that is painful.  He has a hx of hemorrhoids and has had them in the past and has had surgery on them in past--- he can not remember who did it but it was on [address]  No bleeding.  No constipation, no diarrhea.      Review of Systems  Constitutional: Negative for diaphoresis, appetite change, fatigue and unexpected weight change.  Eyes: Negative for pain, redness and visual disturbance.  Respiratory: Negative for cough, chest tightness, shortness of breath and wheezing.   Cardiovascular: Negative for chest pain, palpitations and leg swelling.  Endocrine: Negative for cold intolerance, heat intolerance, polydipsia, polyphagia and polyuria.  Genitourinary: Negative for dysuria, frequency and difficulty urinating.  Neurological: Negative for dizziness, light-headedness, numbness and headaches.    History Past Medical History  Diagnosis Date  . URI (upper respiratory infection)   . Inflamed seborrheic keratosis   . Hemochromatosis     HX OF  . Colon polyps     Tubular Adenoma and Hyperplastic     He has past surgical history that includes Colonoscopy w/ biopsies (10/20/2008); Hemorrhoid banding; Inguinal hernia repair (Right, 02/01/2015); Appendectomy (1950's); Tonsillectomy (1950's); Inguinal hernia repair (N/A, 02/01/2015); and Insertion of mesh (Right, 02/01/2015).   His family history is negative for Colon cancer, Pancreatic cancer, Rectal cancer, and Stomach cancer.He reports that he quit smoking about 6 years ago. His smoking use included Cigarettes. He has a 50 pack-year smoking history. He has never used smokeless tobacco. He reports that he drinks about 1.2 oz of alcohol per week. He reports that he does not use illicit drugs.  Current Outpatient Prescriptions on File Prior to Visit  Medication Sig Dispense Refill  . primidone  (MYSOLINE) 50 MG tablet Take 2 tablets (100 mg total) by mouth at bedtime. 180 tablet 3   No current facility-administered medications on file prior to visit.     Objective:  Objective Physical Exam  Constitutional: He is oriented to person, place, and time. Vital signs are normal. He appears well-developed and well-nourished. He is sleeping.  HENT:  Head: Normocephalic and atraumatic.  Mouth/Throat: Oropharynx is clear and moist.  Eyes: EOM are normal. Pupils are equal, round, and reactive to light.  Neck: Normal range of motion. Neck supple. No thyromegaly present.  Cardiovascular: Normal rate and regular rhythm.   No murmur heard. Pulmonary/Chest: Effort normal and breath sounds normal. No respiratory distress. He has no wheezes. He has no rales. He exhibits no tenderness.  Genitourinary: Rectal exam shows external hemorrhoid and tenderness. Rectal exam shows anal tone normal. Guaiac negative stool.  Musculoskeletal: He exhibits no edema or tenderness.  Neurological: He is alert and oriented to person, place, and time.  Skin: Skin is warm and dry.  Psychiatric: He has a normal mood and affect. His behavior is normal. Judgment and thought content normal.  Nursing note and vitals reviewed.  BP 111/73 mmHg  Pulse 63  Temp(Src) 98.1 F (36.7 C) (Oral)  Ht 6' (1.829 m)  Wt 185 lb 3.2 oz (84.006 kg)  BMI 25.11 kg/m2  SpO2 98% Wt Readings from Last 3 Encounters:  01/03/16 185 lb 2 oz (83.972 kg)  01/02/16 185 lb 3.2 oz (84.006 kg)  11/24/15 185 lb 12.8 oz (84.278 kg)     Lab Results  Component Value Date   WBC 7.7  11/24/2015   HGB 15.9 11/28/2015   HCT 48.7 11/28/2015   PLT 146.0* 11/24/2015   GLUCOSE 90 11/24/2015   CHOL 190 11/24/2015   TRIG 171.0* 11/24/2015   HDL 60.80 11/24/2015   LDLCALC 95 11/24/2015   ALT 16 11/24/2015   AST 17 11/24/2015   NA 139 11/24/2015   K 4.6 11/24/2015   CL 101 11/24/2015   CREATININE 1.22 11/24/2015   BUN 15 11/24/2015   CO2 32  11/24/2015   TSH 0.60 11/20/2012   PSA 1.06 11/24/2015    Ct Chest Lung Ca Screen Low Dose W/o Cm  12/06/2015  CLINICAL DATA:  40 pack-year history.  Former smoker.  Asymptomatic. EXAM: CT CHEST WITHOUT CONTRAST LOW-DOSE FOR LUNG CANCER SCREENING TECHNIQUE: Multidetector CT imaging of the chest was performed following the standard protocol without IV contrast. COMPARISON:  None. FINDINGS: Mediastinum/Nodes: The heart size appears normal. Aortic atherosclerosis is identified. Calcification within the LAD and RCA coronary artery noted. Calcified mediastinal and left hilar lymph nodes identified. No mediastinal or hilar adenopathy. No axillary or supraclavicular adenopathy. Lungs/Pleura: The pleural effusions identified. Moderate changes of centrilobular emphysema identified. There is a small muscle in the left upper lobe which has an equivalent diameter of 3.2 mm, image 27 of series 2. Upper abdomen: Stones are identified within the dependent portion of the gallbladder. Low density structure within the dome of liver measures 7 mm and is too small to characterize. The liver has a somewhat irregular contour or an a heterogeneous attenuation appearance. Calcified granulomas noted within the spleen. The adrenal glands are unremarkable. Visualized portions of the kidneys are normal. Musculoskeletal: Spondylosis noted within the thoracic spine. No aggressive lytic or sclerotic bone lesion identified. IMPRESSION: 1. Lung-RADS Category 2, benign appearance or behavior. Continue annual screening with low-dose chest CT without contrast in 12 months 2. Emphysema 3. Aortic atherosclerosis and coronary artery calcification. 4. Prior granulomatous disease. 5. Gallstones. Electronically Signed   By: Kerby Moors M.D.   On: 12/06/2015 13:43     Assessment & Plan:  Plan I am having Devin Schmidt maintain his primidone.  No orders of the defined types were placed in this encounter.    Problem List Items Addressed This  Visit    External hemorrhoid    Pt is using prep H and wants to con't using that He is requesting referral        Other Visit Diagnoses    Hemorrhoids, unspecified hemorrhoid type    -  Primary    Relevant Orders    Ambulatory referral to Gastroenterology       Follow-up: Return if symptoms worsen or fail to improve.  Garnet Koyanagi, DO

## 2016-01-03 ENCOUNTER — Encounter: Payer: Self-pay | Admitting: Internal Medicine

## 2016-01-03 ENCOUNTER — Ambulatory Visit (INDEPENDENT_AMBULATORY_CARE_PROVIDER_SITE_OTHER): Payer: Medicare Other | Admitting: Internal Medicine

## 2016-01-03 VITALS — BP 116/70 | HR 76 | Ht 71.0 in | Wt 185.1 lb

## 2016-01-03 DIAGNOSIS — K602 Anal fissure, unspecified: Secondary | ICD-10-CM | POA: Diagnosis not present

## 2016-01-03 MED ORDER — DILTIAZEM GEL 2 %: 1.0000 "application " | Freq: Two times a day (BID) | CUTANEOUS | Status: DC

## 2016-01-03 NOTE — Progress Notes (Signed)
   Subjective:    Patient ID: CASHEL OSKEY, male    DOB: 10-Mar-1941, 75 y.o.   MRN: [id number] Chief Complaint is rectal pain HPI This is a 75 year old white man I know from previous colonoscopies and phlebotomy for hemachromatosis area he has had a 9 months of intermittent but worsening rectal pain usually after defecation. It is a sharp cutting-like pain. Occasionally well hard stool but he denies any significant constipation. There is no rectal bleeding noted though he says he doesn't really look at things he has not noticed any in his underwear. Medications, allergies, past medical history, past surgical history, family history and social history are reviewed and updated in the EMR.  Review of Systems As above    Objective:   Physical Exam BP 116/70 mmHg  Pulse 76  Ht 5\' 11"  (1.803 m)  Wt 185 lb 2 oz (83.972 kg)  BMI 25.83 kg/m2 Eyes are anicteric He is alert and oriented 3 Rectal exam reveals a tiny sentinel pile in the posterior aspect, and there is tenderness and a visible small posterior fissure seen in the anal canal. Digital rectal exam is otherwise normal without mass. No blood seen. No perianal changes otherwise.     Assessment & Plan:   1. Anal fissure - posterior     This is a classic posterior fissure. We will treat with Benefiber one to 2 tablespoons daily and diltiazem gel or cream twice a day and after bowel movements if able. I've explained that this can take months to resolve. We will order one prescription with 3 refills. I have told him if he is not significantly better after 2 months to call back. I explained he should use the diltiazem gel for 1 month after feeling well. Should he fail to respond adequately surgical referral could be indicated though we will try to avoid that if we can.   15 minutes time spent with patient > half in counseling coordination of care

## 2016-01-03 NOTE — Patient Instructions (Signed)
  Today you have been given a handout on anal fissures to read and follow.    Today you have been given a handout on benefiber to follow.     We have sent the following medications to [facility] for you to pick up at your convenience: Diltiazem gel, use twice a day and also after bowel movements if your able too.  Use this gel for a month after you feel better.    Follow up with [name] as needed.    I appreciate the opportunity to care for you. Silvano Rusk, MD, [facility]

## 2016-01-04 DIAGNOSIS — K644 Residual hemorrhoidal skin tags: Secondary | ICD-10-CM | POA: Insufficient documentation

## 2016-01-04 NOTE — Assessment & Plan Note (Signed)
Pt is using prep H and wants to con't using that He is requesting referral

## 2016-01-23 ENCOUNTER — Other Ambulatory Visit (INDEPENDENT_AMBULATORY_CARE_PROVIDER_SITE_OTHER): Payer: Medicare Other

## 2016-01-23 LAB — HEMATOCRIT: HCT: 44.2 % (ref 39.0–52.0)

## 2016-01-23 LAB — HEMOGLOBIN: Hemoglobin: 15.1 g/dL (ref 13.0–17.0)

## 2016-01-26 NOTE — Progress Notes (Signed)
Quick Note:  Please repeat phlebotomy in 2 mos Ferritin still needs to be done - ordered but not done so do in June please ______

## 2016-02-28 IMAGING — US US ART/VEN ABD/PELV/SCROTUM DOPPLER LTD
1 series · 13 of 25 positions shown · non-contrast
Comparison: None.

CLINICAL DATA: RIGHT testicular pain

EXAM:
SCROTAL ULTRASOUND
DOPPLER ULTRASOUND OF THE TESTICLES
TECHNIQUE: Complete ultrasound examination of the testicles, epididymis, and
other scrotal structures was performed. Color and spectral Doppler
ultrasound were also utilized to evaluate blood flow to the
testicles.

[Series 1: us art/ven abd/pelv/scrotum doppler ltd · 0.07mm/px · 13 of 36 slices shown]
[im 1/36]
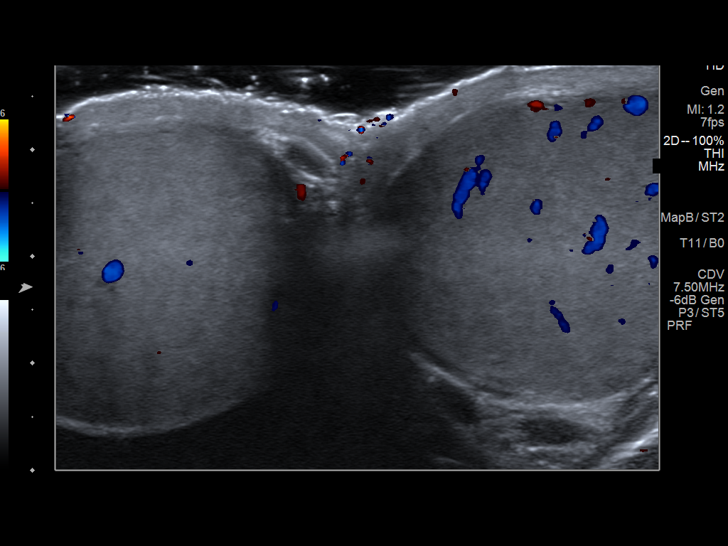
[im 3/36]
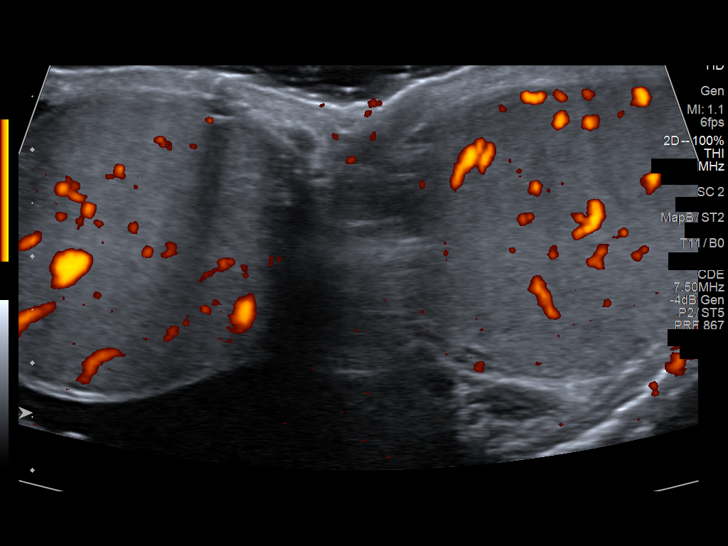
[im 6/36]
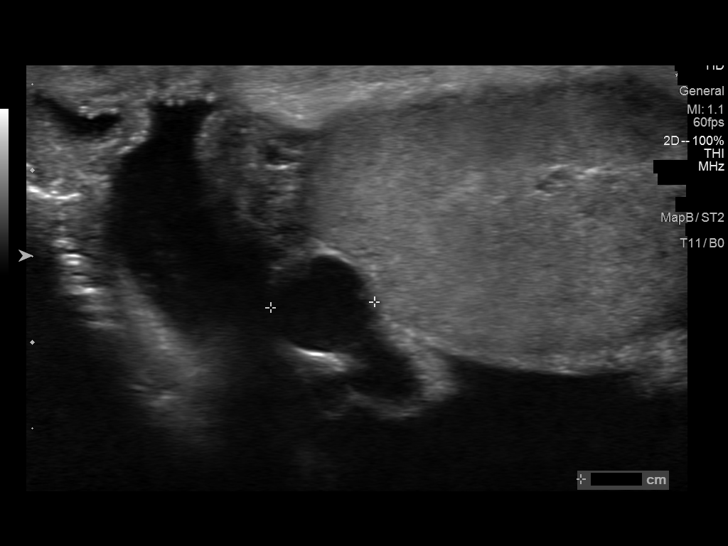
[im 9/36]
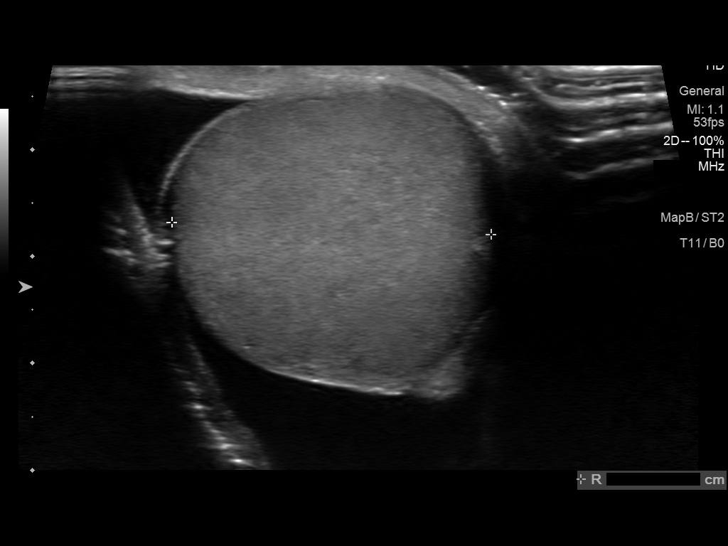
[im 12/36]
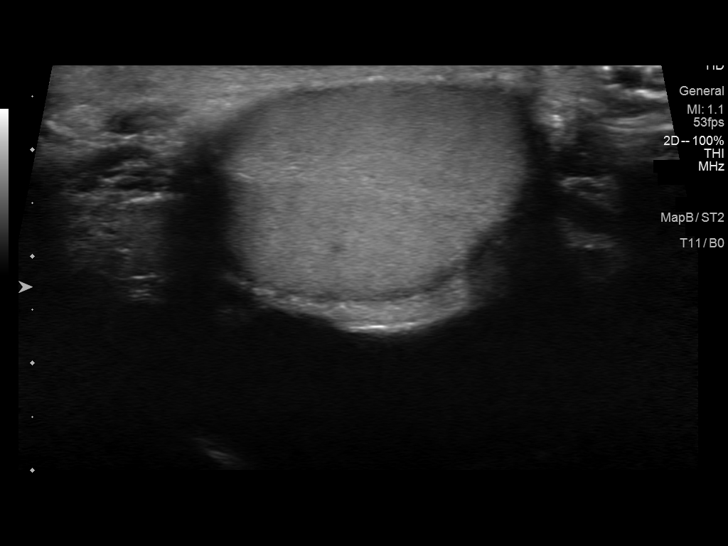
[im 15/36]
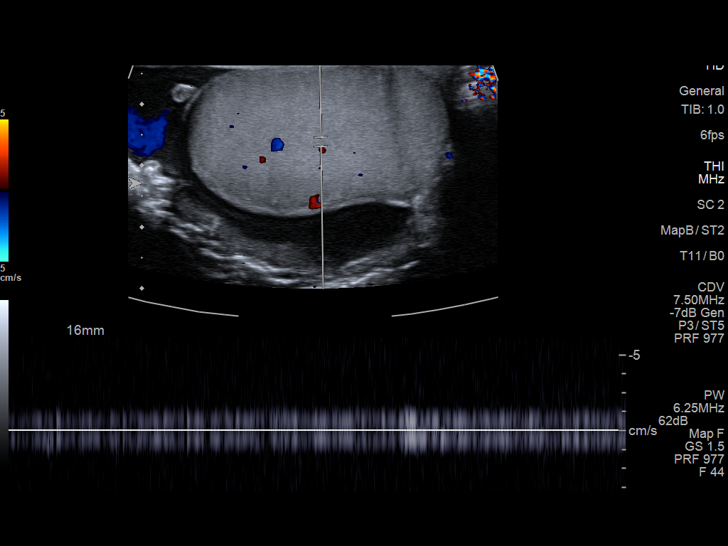
[im 18/36]
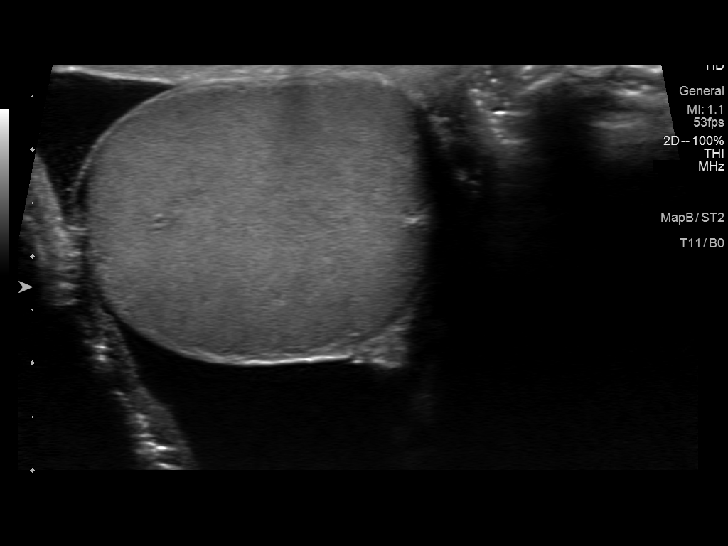
[im 21/36]
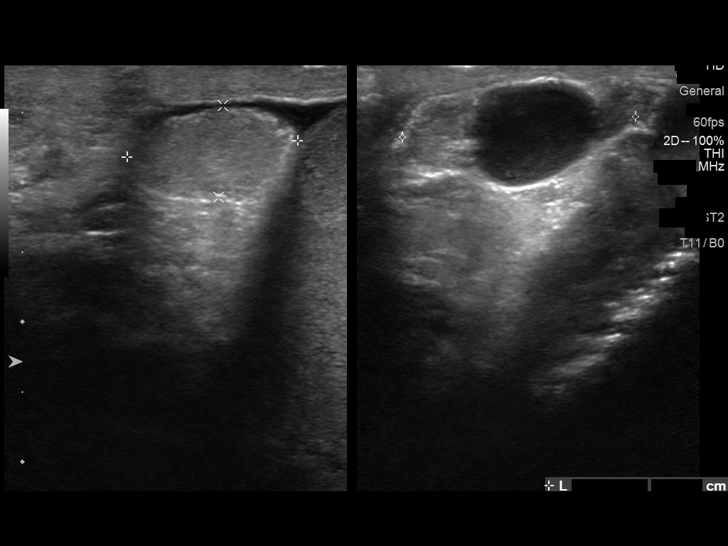
[im 24/36]
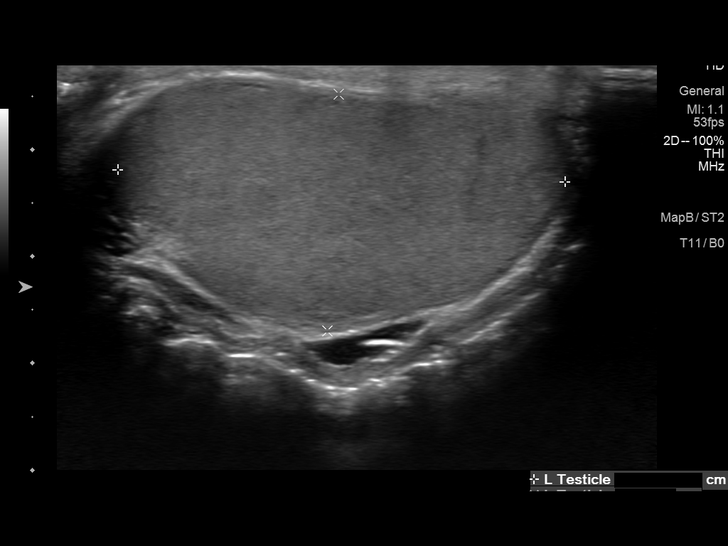
[im 27/36]
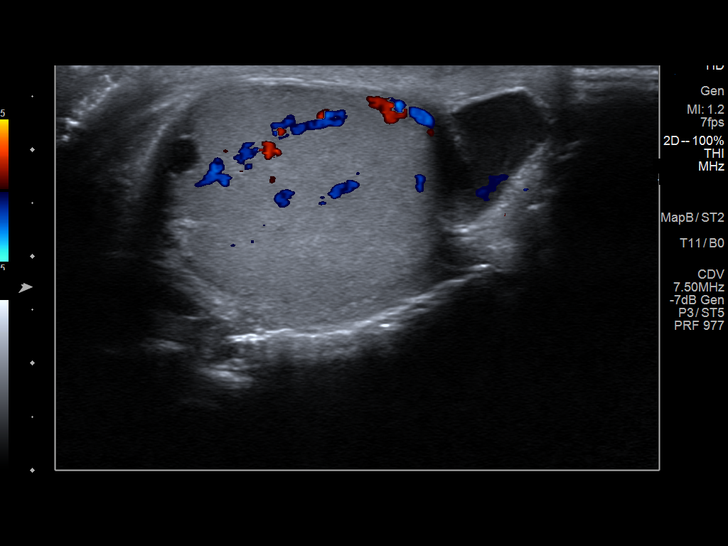
[im 30/36]
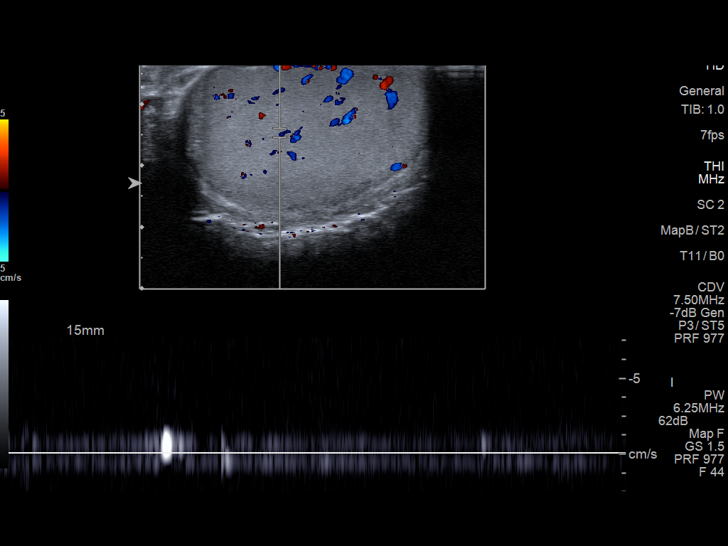
[im 33/36]
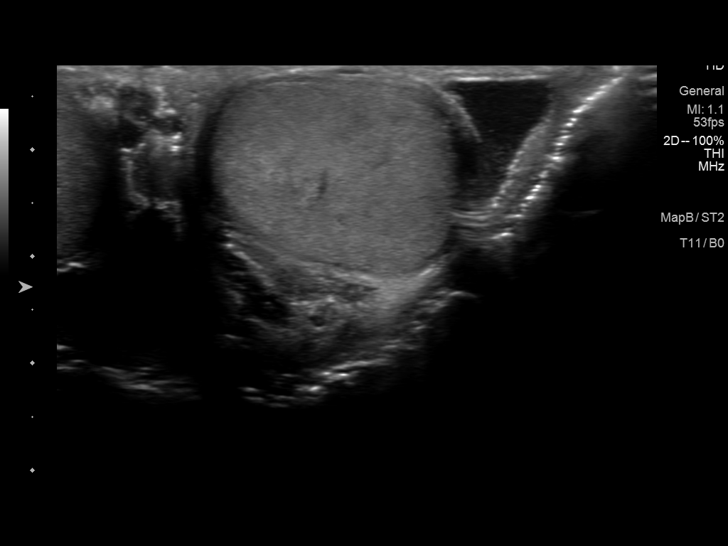
[im 36/36]
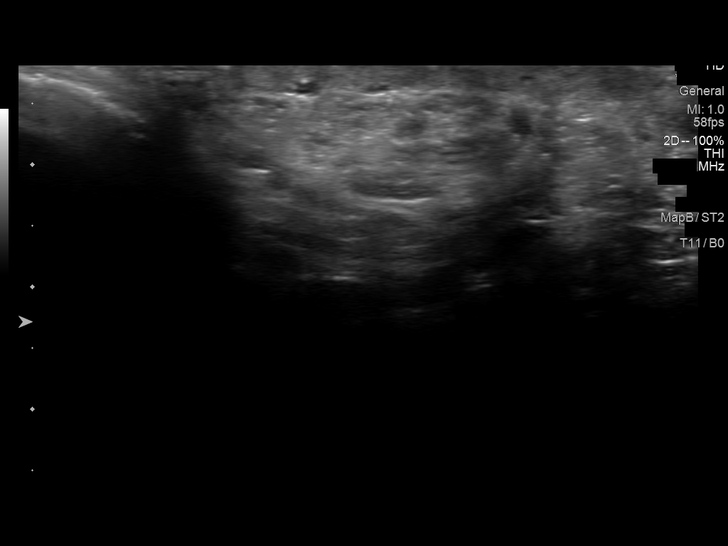

[13 of 25 positions shown; findings below may reference images not displayed]

FINDINGS: Right testicle

Measurements: 4.4 x 2.5 x 3.3 cm. Normal morphology without mass or
calcification. Internal blood flow present on color Doppler imaging.

Left testicle

Measurements: 4.2 x 2.2 x 3.4 cm. Small cyst at superior aspect of
testis measuring 4 x 5 x 5 mm. No additional mass or calcification.
Internal blood flow present on color Doppler imaging.

Right epididymis: Small cysts in RIGHT epididymis largest 6 x 6 x 6
mm.

Left epididymis: Epididymal cyst 9 x 9 x 10 mm. Additional isoechoic
nodules at head of epididymis may represent small spermatoceles
containing internal echogenic material/debris measuring up to 8 mm
greatest size.

Hydrocele:  Small BILATERAL hydroceles RIGHT greater than LEFT

Varicocele:  Absent bilaterally

Pulsed Doppler interrogation of both testes demonstrates normal low
resistance arterial and venous waveforms bilaterally.

Small RIGHT scrotal pearl 7 mm diameter.

Small appendix testis noted on RIGHT.

No hernia is seen.
IMPRESSION: No evidence of solid testicular mass or torsion.

Small cyst at superior aspect of LEFT testis, 4 x 5 x 5 mm,
nonspecific.

BILATERAL epididymal cysts, some which appear to contain echogenic
material/ debris, potentially spermatoceles.

BILATERAL hydroceles with note of a RIGHT scrotal pearl 7 mm
diameter.

## 2016-03-28 ENCOUNTER — Other Ambulatory Visit (INDEPENDENT_AMBULATORY_CARE_PROVIDER_SITE_OTHER): Payer: Medicare Other

## 2016-03-28 ENCOUNTER — Other Ambulatory Visit: Payer: Self-pay

## 2016-03-28 LAB — HEMATOCRIT: HEMATOCRIT: 44.5 % (ref 39.0–52.0)

## 2016-03-28 LAB — HEMOGLOBIN: Hemoglobin: 15.3 g/dL (ref 13.0–17.0)

## 2016-04-02 ENCOUNTER — Other Ambulatory Visit: Payer: Self-pay

## 2016-04-02 NOTE — Progress Notes (Signed)
Quick Note:  Repeat phlebotomy in 2 months Also order a ferritin for that time ______

## 2016-05-23 ENCOUNTER — Other Ambulatory Visit (INDEPENDENT_AMBULATORY_CARE_PROVIDER_SITE_OTHER): Payer: Medicare Other

## 2016-05-23 LAB — HEMOGLOBIN: HEMOGLOBIN: 15.4 g/dL (ref 13.0–17.0)

## 2016-05-23 LAB — HEMATOCRIT: HCT: 44.4 % (ref 39.0–52.0)

## 2016-06-01 NOTE — Progress Notes (Signed)
Please find out status of ferritin that was ordered

## 2016-06-01 NOTE — Progress Notes (Signed)
Just have them do it with phlebotomy again in 2 months

## 2016-06-28 ENCOUNTER — Encounter: Payer: Self-pay | Admitting: Family Medicine

## 2016-06-28 ENCOUNTER — Ambulatory Visit (INDEPENDENT_AMBULATORY_CARE_PROVIDER_SITE_OTHER): Payer: Medicare Other | Admitting: Family Medicine

## 2016-06-28 VITALS — BP 100/70 | HR 64 | Temp 98.0°F | Ht 72.0 in | Wt 181.6 lb

## 2016-06-28 DIAGNOSIS — R1030 Lower abdominal pain, unspecified: Secondary | ICD-10-CM | POA: Diagnosis not present

## 2016-06-28 DIAGNOSIS — M545 Low back pain: Secondary | ICD-10-CM | POA: Diagnosis not present

## 2016-06-28 DIAGNOSIS — Z9889 Other specified postprocedural states: Principal | ICD-10-CM

## 2016-06-28 DIAGNOSIS — Z8719 Personal history of other diseases of the digestive system: Secondary | ICD-10-CM

## 2016-06-28 NOTE — Progress Notes (Signed)
Chief Complaint  Patient presents with  . Pain in (R) groin    radiating all to the back-x 1 week    Subjective: Patient is a 75 y.o. male here for pain in the groinradiating to his back.  This has been going on for 1 week. He has had a hernia before and thinks it feels similar to that. It was repaired in 2015. He feels the similar discomfort when he bends forward. Denies fevers, bowel changes, testicular pain, or feeling a bulge.  ROS: GU: Denies urinary pain or bleeding GI: No bulge or stool changes  Family History  Problem Relation Age of Onset  . Colon cancer Neg Hx   . Pancreatic cancer Neg Hx   . Rectal cancer Neg Hx   . Stomach cancer Neg Hx    Past Medical History:  Diagnosis Date  . Colon polyps    Tubular Adenoma and Hyperplastic   . Hemochromatosis    HX OF  . Inflamed seborrheic keratosis   . URI (upper respiratory infection)    No Known Allergies  Current Outpatient Prescriptions:  .  primidone (MYSOLINE) 50 MG tablet, Take 2 tablets (100 mg total) by mouth at bedtime., Disp: 180 tablet, Rfl: 3  Objective: BP 100/70 (BP Location: Left Arm, Patient Position: Sitting, Cuff Size: Normal)   Pulse 64   Temp 98 F (36.7 C) (Oral)   Ht 6' (1.829 m)   Wt 181 lb 9.6 oz (82.4 kg)   SpO2 98%   BMI 24.63 kg/m  General: Awake, appears stated age HEENT: MMM, EOMi Heart: RRR, no murmurs Lungs: CTAB, no rales, wheezes or rhonchi. Normal effort Abd: BS+, soft, NT, ND, no masses or organomegaly GU: I do not appreciate any bulges. There is no testicular TTP. On hernia exam, I did not appreciate any mass or bulge upon valsalva. There was not tenderness upon digital entry to the inguinal canal.  MSK: There is no TTP along the inguinal region or hip. Negative [place]. Psych: Age appropriate judgment and insight, normal affect and mood  Assessment and Plan: History of inguinal hernia repair - Plan: US Abdomen Limited  I did not clinically appreciate a hernia. That  being said, given his history and general concern, will order above to ensure there is no failure.  F/u prn. The patient voiced understanding and agreement to the plan.  [place], DO 06/28/16  4:32 PM

## 2016-06-28 NOTE — Progress Notes (Signed)
Pre visit review using our clinic review tool, if applicable. No additional management support is needed unless otherwise documented below in the visit note. 

## 2016-06-29 ENCOUNTER — Telehealth: Payer: Self-pay | Admitting: Family Medicine

## 2016-06-29 NOTE — Telephone Encounter (Signed)
Imaging calling again, would like to know decision.

## 2016-06-29 NOTE — Telephone Encounter (Signed)
Imaging called back stating that CT abd/pel with contrast would be better rather than abdominal US for hernia. Can new order be placed?

## 2016-07-02 ENCOUNTER — Other Ambulatory Visit: Payer: Self-pay | Admitting: Family Medicine

## 2016-07-02 ENCOUNTER — Ambulatory Visit (HOSPITAL_BASED_OUTPATIENT_CLINIC_OR_DEPARTMENT_OTHER): Admission: RE | Admit: 2016-07-02 | Payer: Medicare Other | Source: Ambulatory Visit

## 2016-07-02 ENCOUNTER — Ambulatory Visit (HOSPITAL_BASED_OUTPATIENT_CLINIC_OR_DEPARTMENT_OTHER)
Admission: RE | Admit: 2016-07-02 | Discharge: 2016-07-02 | Disposition: A | Discharge status: Home / Self Care | Payer: Medicare Other | Source: Ambulatory Visit | Attending: Family Medicine | Admitting: Family Medicine

## 2016-07-02 DIAGNOSIS — K4090 Unilateral inguinal hernia, without obstruction or gangrene, not specified as recurrent: Secondary | ICD-10-CM | POA: Diagnosis not present

## 2016-07-02 DIAGNOSIS — I708 Atherosclerosis of other arteries: Secondary | ICD-10-CM | POA: Diagnosis not present

## 2016-07-02 DIAGNOSIS — Z8719 Personal history of other diseases of the digestive system: Secondary | ICD-10-CM

## 2016-07-02 DIAGNOSIS — I700 Atherosclerosis of aorta: Secondary | ICD-10-CM | POA: Diagnosis not present

## 2016-07-02 DIAGNOSIS — Z9889 Other specified postprocedural states: Secondary | ICD-10-CM | POA: Insufficient documentation

## 2016-07-02 DIAGNOSIS — K8020 Calculus of gallbladder without cholecystitis without obstruction: Secondary | ICD-10-CM | POA: Insufficient documentation

## 2016-07-02 NOTE — Progress Notes (Signed)
Please let the pt know that we are going to get a CT scan of his abdomen to rule out hernia for a better picture. This should be better in the setting of having had a repair done. Thanks.

## 2016-07-02 NOTE — Telephone Encounter (Signed)
Please let the pt know that we are going to get a CT scan of his abdomen to rule out hernia for a better picture. This should be better in the setting of having had a repair done. Thanks.

## 2016-07-02 NOTE — Telephone Encounter (Signed)
Pt was made aware and CT of abdomen was scheduled and done.//AB/CMA

## 2016-07-25 ENCOUNTER — Other Ambulatory Visit (INDEPENDENT_AMBULATORY_CARE_PROVIDER_SITE_OTHER): Payer: Medicare Other

## 2016-07-25 LAB — HEMATOCRIT: HCT: 44.8 % (ref 39.0–52.0)

## 2016-07-25 LAB — HEMOGLOBIN: Hemoglobin: 15.6 g/dL (ref 13.0–17.0)

## 2016-07-30 ENCOUNTER — Other Ambulatory Visit: Payer: Self-pay

## 2016-07-30 NOTE — Progress Notes (Signed)
Repeat phlebotomy in 2 months Needs a ferritin then also - they are missing it

## 2016-09-19 ENCOUNTER — Ambulatory Visit: Payer: Medicare Other | Admitting: Physician Assistant

## 2016-09-26 ENCOUNTER — Other Ambulatory Visit (INDEPENDENT_AMBULATORY_CARE_PROVIDER_SITE_OTHER): Payer: Medicare Other

## 2016-09-26 LAB — FERRITIN: Ferritin: 77.3 ng/mL (ref 22.0–322.0)

## 2016-09-26 LAB — HEMOGLOBIN: HEMOGLOBIN: 15.6 g/dL (ref 13.0–17.0)

## 2016-09-26 LAB — HEMATOCRIT: HCT: 45.6 % (ref 39.0–52.0)

## 2016-09-27 NOTE — Progress Notes (Signed)
Ferritin is > 50 so I recommend he do monthly phlebotomy and recheck the ferritin in 6 months

## 2016-09-28 ENCOUNTER — Other Ambulatory Visit: Payer: Self-pay

## 2016-10-10 ENCOUNTER — Encounter: Payer: Self-pay | Admitting: Gastroenterology

## 2016-10-10 ENCOUNTER — Ambulatory Visit (INDEPENDENT_AMBULATORY_CARE_PROVIDER_SITE_OTHER): Payer: Medicare Other | Admitting: Gastroenterology

## 2016-10-10 VITALS — BP 130/72 | HR 72 | Ht 72.0 in | Wt 180.0 lb

## 2016-10-10 DIAGNOSIS — K602 Anal fissure, unspecified: Secondary | ICD-10-CM | POA: Insufficient documentation

## 2016-10-10 HISTORY — DX: Anal fissure, unspecified: K60.2

## 2016-10-10 MED ORDER — AMBULATORY NON FORMULARY MEDICATION: 3 refills | Status: DC

## 2016-10-10 NOTE — Progress Notes (Signed)
     10/10/2016 RYELEE Schmidt [id number] 01-07-1941   History of Present Illness:  This is a 75 year old male known to Dr. Carlean Purl. Was seen by Dr. Carlean Purl in March of this year and was treated with diltiazem gel for an posterior anal fissure. He says that he used that medication for about 2 months and his symptoms completely resolved so he discontinued it. Then now again for the past 3 months or so he has had the same symptoms/pain return. He refilled the diltiazem and has been using that again for about 3 weeks. He has already noticed some improvement. He also mentions some bumps and irritation on the left side of his perianal area.  No bleeding noted.  Current Medications, Allergies, Past Medical History, Past Surgical History, Family History and Social History were reviewed in [employer] record.   Physical Exam: BP 130/72   Pulse 72   Ht 6' (1.829 m)   Wt 180 lb (81.6 kg)   BMI 24.41 kg/m  General: Well developed white male in no acute distress Head: Normocephalic and atraumatic Eyes:  Sclerae anicteric, conjunctiva pink  Ears: Normal auditory acuity Lungs: Clear throughout to auscultation Heart: Regular rate and rhythm Abdomen: Soft, non-distended.  Normal bowel sounds.  Non-tender. Rectal:  Reveals a tiny sentinel pile in the posterior aspect, and there is tenderness and a visible small posterior fissure seen in the anal canal.  DRE otherwise did not reveal any masses.  Perianally there was some very mild folliculitis on the left.  Digital rectal exam is otherwise normal without mass. No blood seen. Musculoskeletal: Symmetrical with no gross deformities  Extremities: No edema  Neurological: Alert oriented x 4, grossly non-focal Psychological:  Alert and cooperative. Normal mood and affect  Assessment and Recommendations:  Anal fissure - posterior:  Treated by Dr. Carlean Purl in the spring with diltiazem gel. Symptoms completely resolved, but he is now been  symptomatic again for the past 3 months. He has been using the diltiazem gel again for about 3 weeks and has already noticed some improvement. He is asking about an alternative to try. We discussed nitroglycerin 0.125 mg. He will finish up the diltiazem that he has and then will try the nitroglycerin in its place. Encouraged him to use this twice a day on a daily basis for at least 8 weeks.  He can try some OTC hydrocortisone for what appears to be some very mild perianal folliculitis.  Call back for ongoing or worsening symptoms.

## 2016-10-10 NOTE — Patient Instructions (Signed)
We have sent nitroglycerin ointment to your pharmacy to use twice daily.   Continue to use topical ointments as directed for at least 8 weeks.

## 2016-10-17 NOTE — Progress Notes (Signed)
Agree with Ms. Zehr's management.  Royden Bulman E. Abreanna Drawdy, MD, FACG  

## 2016-10-24 ENCOUNTER — Other Ambulatory Visit (INDEPENDENT_AMBULATORY_CARE_PROVIDER_SITE_OTHER): Payer: Medicare Other

## 2016-10-24 LAB — FERRITIN: Ferritin: 29 ng/mL (ref 22.0–322.0)

## 2016-10-24 LAB — HEMOGLOBIN: Hemoglobin: 15.5 g/dL (ref 13.0–17.0)

## 2016-10-24 LAB — HEMATOCRIT: HCT: 44.1 % (ref 39.0–52.0)

## 2016-10-25 NOTE — Progress Notes (Signed)
Go to every 2 month phlebotomy

## 2016-11-26 ENCOUNTER — Encounter: Payer: Self-pay | Admitting: Family Medicine

## 2016-11-26 ENCOUNTER — Telehealth: Payer: Self-pay | Admitting: Family Medicine

## 2016-11-26 ENCOUNTER — Ambulatory Visit (INDEPENDENT_AMBULATORY_CARE_PROVIDER_SITE_OTHER): Payer: Medicare Other | Admitting: Family Medicine

## 2016-11-26 VITALS — BP 120/70 | HR 55 | Temp 97.7°F | Resp 16 | Ht 72.0 in | Wt 184.6 lb

## 2016-11-26 DIAGNOSIS — Z0000 Encounter for general adult medical examination without abnormal findings: Secondary | ICD-10-CM | POA: Diagnosis not present

## 2016-11-26 DIAGNOSIS — R350 Frequency of micturition: Secondary | ICD-10-CM

## 2016-11-26 DIAGNOSIS — G250 Essential tremor: Secondary | ICD-10-CM | POA: Diagnosis not present

## 2016-11-26 DIAGNOSIS — Z136 Encounter for screening for cardiovascular disorders: Secondary | ICD-10-CM | POA: Insufficient documentation

## 2016-11-26 LAB — POC URINALSYSI DIPSTICK (AUTOMATED)
Bilirubin, UA: NEGATIVE
Blood, UA: NEGATIVE
Glucose, UA: NEGATIVE
Ketones, UA: NEGATIVE
LEUKOCYTES UA: NEGATIVE
Nitrite, UA: NEGATIVE
PROTEIN UA: NEGATIVE
Spec Grav, UA: >=1.03
UROBILINOGEN UA: 0.2
pH, UA: 6

## 2016-11-26 LAB — LIPID PANEL
CHOLESTEROL: 168 mg/dL (ref 0–200)
HDL: 61.7 mg/dL (ref >39.00)
LDL Cholesterol: 82 mg/dL (ref 0–99)
NonHDL: 105.87
TRIGLYCERIDES: 117 mg/dL (ref 0.0–149.0)
Total CHOL/HDL Ratio: 3
VLDL: 23.4 mg/dL (ref 0.0–40.0)

## 2016-11-26 LAB — CBC
HCT: 46.5 % (ref 39.0–52.0)
Hemoglobin: 15.7 g/dL (ref 13.0–17.0)
MCHC: 33.7 g/dL (ref 30.0–36.0)
MCV: 99.8 fl (ref 78.0–100.0)
Platelets: 183 10*3/uL (ref 150.0–400.0)
RBC: 4.66 Mil/uL (ref 4.22–5.81)
RDW: 12.8 % (ref 11.5–15.5)
WBC: 5.9 10*3/uL (ref 4.0–10.5)

## 2016-11-26 LAB — COMPREHENSIVE METABOLIC PANEL
ALBUMIN: 4.1 g/dL (ref 3.5–5.2)
ALT: 12 U/L (ref 0–53)
AST: 14 U/L (ref 0–37)
Alkaline Phosphatase: 80 U/L (ref 39–117)
BILIRUBIN TOTAL: 0.5 mg/dL (ref 0.2–1.2)
BUN: 18 mg/dL (ref 6–23)
CALCIUM: 8.8 mg/dL (ref 8.4–10.5)
CHLORIDE: 103 meq/L (ref 96–112)
CO2: 32 mEq/L (ref 19–32)
CREATININE: 1.2 mg/dL (ref 0.40–1.50)
GFR: 62.23 mL/min (ref >60.00)
Glucose, Bld: 85 mg/dL (ref 70–99)
Potassium: 4.3 mEq/L (ref 3.5–5.1)
Sodium: 139 mEq/L (ref 135–145)
Total Protein: 6.5 g/dL (ref 6.0–8.3)

## 2016-11-26 LAB — PSA, MEDICARE: PSA: 1.14 ng/ml (ref 0.10–4.00)

## 2016-11-26 MED ORDER — PRIMIDONE 50 MG PO TABS: 50.0000 mg | ORAL_TABLET | Freq: Every day | ORAL | 3 refills | Status: DC

## 2016-11-26 NOTE — Patient Instructions (Signed)
Preventive Care 65 Years and Older, Male Preventive care refers to lifestyle choices and visits with your health care provider that can promote health and wellness. What does preventive care include?  A yearly physical exam. This is also called an annual well check.  Dental exams once or twice a year.  Routine eye exams. Ask your health care provider how often you should have your eyes checked.  Personal lifestyle choices, including:  Daily care of your teeth and gums.  Regular physical activity.  Eating a healthy diet.  Avoiding tobacco and drug use.  Limiting alcohol use.  Practicing safe sex.  Taking low doses of aspirin every day.  Taking vitamin and mineral supplements as recommended by your health care provider. What happens during an annual well check? The services and screenings done by your health care provider during your annual well check will depend on your age, overall health, lifestyle risk factors, and family history of disease. Counseling  Your health care provider may ask you questions about your:  Alcohol use.  Tobacco use.  Drug use.  Emotional well-being.  Home and relationship well-being.  Sexual activity.  Eating habits.  History of falls.  Memory and ability to understand (cognition).  Work and work environment. Screening  You may have the following tests or measurements:  Height, weight, and BMI.  Blood pressure.  Lipid and cholesterol levels. These may be checked every 5 years, or more frequently if you are over 50 years old.  Skin check.  Lung cancer screening. You may have this screening every year starting at age 55 if you have a 30-pack-year history of smoking and currently smoke or have quit within the past 15 years.  Fecal occult blood test (FOBT) of the stool. You may have this test every year starting at age 50.  Flexible sigmoidoscopy or colonoscopy. You may have a sigmoidoscopy every 5 years or a colonoscopy every 10  years starting at age 50.  Prostate cancer screening. Recommendations will vary depending on your family history and other risks.  Hepatitis C blood test.  Hepatitis B blood test.  Sexually transmitted disease (STD) testing.  Diabetes screening. This is done by checking your blood sugar (glucose) after you have not eaten for a while (fasting). You may have this done every 1-3 years.  Abdominal aortic aneurysm (AAA) screening. You may need this if you are a current or former smoker.  Osteoporosis. You may be screened starting at age 70 if you are at high risk. Talk with your health care provider about your test results, treatment options, and if necessary, the need for more tests. Vaccines  Your health care provider may recommend certain vaccines, such as:  Influenza vaccine. This is recommended every year.  Tetanus, diphtheria, and acellular pertussis (Tdap, Td) vaccine. You may need a Td booster every 10 years.  Varicella vaccine. You may need this if you have not been vaccinated.  Zoster vaccine. You may need this after age 60.  Measles, mumps, and rubella (MMR) vaccine. You may need at least one dose of MMR if you were born in 1957 or later. You may also need a second dose.  Pneumococcal 13-valent conjugate (PCV13) vaccine. One dose is recommended after age 65.  Pneumococcal polysaccharide (PPSV23) vaccine. One dose is recommended after age 65.  Meningococcal vaccine. You may need this if you have certain conditions.  Hepatitis A vaccine. You may need this if you have certain conditions or if you travel or work in places where   you may be exposed to hepatitis A.  Hepatitis B vaccine. You may need this if you have certain conditions or if you travel or work in places where you may be exposed to hepatitis B.  Haemophilus influenzae type b (Hib) vaccine. You may need this if you have certain risk factors. Talk to your health care provider about which screenings and vaccines you  need and how often you need them. This information is not intended to replace advice given to you by your health care provider. Make sure you discuss any questions you have with your health care provider. Document Released: 11/04/2015 Document Revised: 06/27/2016 Document Reviewed: 08/09/2015 Elsevier Interactive Patient Education  2017 [employer].

## 2016-11-26 NOTE — Telephone Encounter (Signed)
Patient states Rx was sent to wrong pharmacy. Needs to be sent to  [facility], [place]. [phone] (Phone) [phone] (Fax)

## 2016-11-26 NOTE — Progress Notes (Signed)
Patient ID: Devin Schmidt, male    DOB: 1941/03/26  Age: 76 y.o. MRN: [id number]    Subjective:  Subjective  HPI Devin Schmidt presents for annual exam.  Has some itching in both ears for about 5 months.  Has been using a compound cream which seems to help.  Review of Systems  Constitutional: Negative.  Negative for appetite change, diaphoresis, fatigue and unexpected weight change.  HENT: Negative for congestion, ear pain, hearing loss, nosebleeds, postnasal drip, rhinorrhea, sinus pressure, sneezing and tinnitus.   Eyes: Negative for photophobia, pain, discharge, redness, itching and visual disturbance.  Respiratory: Negative.  Negative for cough, chest tightness, shortness of breath and wheezing.   Cardiovascular: Negative.  Negative for chest pain, palpitations and leg swelling.  Gastrointestinal: Negative for abdominal distention, abdominal pain, anal bleeding, blood in stool and constipation.  Endocrine: Negative.  Negative for cold intolerance, heat intolerance, polydipsia, polyphagia and polyuria.  Genitourinary: Negative.  Negative for difficulty urinating, dysuria and frequency.  Musculoskeletal: Negative.   Skin: Negative.   Allergic/Immunologic: Negative.   Neurological: Negative for dizziness, weakness, light-headedness, numbness and headaches.  Psychiatric/Behavioral: Negative for agitation, confusion, decreased concentration, dysphoric mood, sleep disturbance and suicidal ideas. The patient is not nervous/anxious.     History Past Medical History:  Diagnosis Date  . Colon polyps    Tubular Adenoma and Hyperplastic   . Hemochromatosis    HX OF  . Inflamed seborrheic keratosis   . URI (upper respiratory infection)     He has a past surgical history that includes Colonoscopy w/ biopsies (10/20/2008); Hemorrhoid banding; Inguinal hernia repair (Right, 02/01/2015); Appendectomy (1950's); Tonsillectomy (1950's); Inguinal hernia repair (N/A, 02/01/2015); and Insertion of  mesh (Right, 02/01/2015).   His family history is not on file.He reports that he quit smoking about 7 years ago. His smoking use included Cigarettes. He has a 50.00 pack-year smoking history. He has never used smokeless tobacco. He reports that he drinks about 1.2 oz of alcohol per week . He reports that he does not use drugs.  Current Outpatient Prescriptions on File Prior to Visit  Medication Sig Dispense Refill  . AMBULATORY NON FORMULARY MEDICATION Medication Name: Nitro 0.125 % ointment use to anal area twice daily. 1 Tube 3   No current facility-administered medications on file prior to visit.      Objective:  Objective  Physical Exam  Constitutional: He is oriented to person, place, and time. Vital signs are normal. He appears well-developed and well-nourished. He is sleeping. No distress.  HENT:  Head: Normocephalic and atraumatic.  Right Ear: External ear normal.  Left Ear: External ear normal.  Nose: Nose normal.  Mouth/Throat: Oropharynx is clear and moist. No oropharyngeal exudate.  Eyes: Conjunctivae and EOM are normal. Pupils are equal, round, and reactive to light. Right eye exhibits no discharge. Left eye exhibits no discharge.  Neck: Normal range of motion. Neck supple. No JVD present. No thyromegaly present.  Cardiovascular: Normal rate, regular rhythm and intact distal pulses.  Exam reveals no gallop and no friction rub.   No murmur heard. Pulmonary/Chest: Effort normal and breath sounds normal. No respiratory distress. He has no wheezes. He has no rales. He exhibits no tenderness.  Abdominal: Soft. Bowel sounds are normal. He exhibits no distension and no mass. There is no tenderness. There is no rebound and no guarding.  Genitourinary: Rectum normal, prostate normal and penis normal. Rectal exam shows guaiac negative stool.  Musculoskeletal: Normal range of motion. He exhibits no edema  or tenderness.  Lymphadenopathy:    He has no cervical adenopathy.  Neurological:  He is alert and oriented to person, place, and time. He displays normal reflexes. He exhibits normal muscle tone.  Skin: Skin is warm and dry. No rash noted. He is not diaphoretic. No erythema. No pallor.  Psychiatric: He has a normal mood and affect. His behavior is normal. Judgment and thought content normal.  Nursing note and vitals reviewed.  BP 120/70 (BP Location: Left Arm, Cuff Size: Normal)   Pulse (!) 55   Temp 97.7 F (36.5 C) (Oral)   Resp 16   Ht 6' (1.829 m)   Wt 184 lb 9.6 oz (83.7 kg)   SpO2 96%   BMI 25.04 kg/m  Wt Readings from Last 3 Encounters:  11/26/16 184 lb 9.6 oz (83.7 kg)  10/10/16 180 lb (81.6 kg)  06/28/16 181 lb 9.6 oz (82.4 kg)     Lab Results  Component Value Date   WBC 5.9 11/26/2016   HGB 15.7 11/26/2016   HCT 46.5 11/26/2016   PLT 183.0 11/26/2016   GLUCOSE 85 11/26/2016   CHOL 168 11/26/2016   TRIG 117.0 11/26/2016   HDL 61.70 11/26/2016   LDLCALC 82 11/26/2016   ALT 12 11/26/2016   AST 14 11/26/2016   NA 139 11/26/2016   K 4.3 11/26/2016   CL 103 11/26/2016   CREATININE 1.20 11/26/2016   BUN 18 11/26/2016   CO2 32 11/26/2016   TSH 0.60 11/20/2012   PSA 1.14 11/26/2016    Ct Abdomen Pelvis Wo Contrast  Result Date: 07/02/2016 CLINICAL DATA:  History of inguinal hernia or repair. Right groin pain for 2 days. EXAM: CT ABDOMEN AND PELVIS WITHOUT CONTRAST TECHNIQUE: Multidetector CT imaging of the abdomen and pelvis was performed following the standard protocol without IV contrast. COMPARISON:  None. FINDINGS: Lower chest: Lung bases are clear. No effusions. Heart is normal size. Hepatobiliary: Small hypodensities in the liver compatible with small cysts. No additional focal hepatic abnormality. Layering small gallstones in the gallbladder. No biliary ductal dilatation. Pancreas: No focal abnormality or ductal dilatation. Spleen: Calcifications throughout the spleen compatible granulomatous disease. Normal size. Adrenals/Urinary Tract:  Bilateral renal perinephric stranding, likely related to old insult. No hydronephrosis or focal abnormality. No visible stones. Adrenal glands and urinary bladder unremarkable. Stomach/Bowel: Stomach, large and small bowel grossly unremarkable. Vascular/Lymphatic: Diffuse aortic and iliac calcifications. No aneurysm. No adenopathy. Reproductive: Markedly enlarged prostate, measuring 7.2 x 7.0 cm. Other: No free fluid or free air. Postoperative changes from right inguinal hernia repair. No recurrent or residual hernia on the right. There is a small to moderate-sized left inguinal hernia containing fat. Musculoskeletal: No acute bony abnormality. Degenerative changes in the lumbar spine. IMPRESSION: Cholelithiasis. 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 Prior right inguinal hernia repair without recurrent hernia. Small to moderate left inguinal hernia containing fat. Aortoiliac atherosclerosis. Electronically Signed   By: Rolm Baptise M.D.   On: 07/02/2016 11:12     Assessment & Plan:  Plan  I have discontinued Mr. Barwick primidone. I am also having him maintain his AMBULATORY NON FORMULARY MEDICATION and NONFORMULARY OR COMPOUNDED ITEM.  Meds ordered this encounter  Medications  . NONFORMULARY OR COMPOUNDED ITEM    Sig: 1 application 2 (two) times daily. Apply into both ears twice daily as needed for itching.   Nystatin 20g/Triamcinolone 20g/Aquaphor 20g  #60g  . DISCONTD: primidone (MYSOLINE) 50 MG tablet    Sig: Take 1 tablet (50 mg total) by  mouth at bedtime.    Dispense:  90 tablet    Refill:  3    Please put on hold until patient calls to refill.  Thanks    Problem List Items Addressed This Visit      Unprioritized   Essential tremor   Relevant Orders   Comprehensive metabolic panel (Completed)   Preventative health care - Primary    ghm utd Pt refused all vaccines Check labs See AVs       Other Visit Diagnoses    Hereditary hemochromatosis ([place])       Relevant Orders   CBC  (Completed)   Comprehensive metabolic panel (Completed)   Lipid panel (Completed)   Urinary frequency       Relevant Orders   PSA, Medicare (Completed)   POCT Urinalysis Dipstick (Automated) (Completed)      Follow-up: Return in about 1 year (around 11/26/2017) for fasting annual exam.  Ann Held, DO

## 2016-11-26 NOTE — Telephone Encounter (Signed)
Resent to correct pharmacy. Patient informed and canceled order at [facility]

## 2016-11-26 NOTE — Assessment & Plan Note (Signed)
ghm utd Pt refused all vaccines Check labs See AVs

## 2016-11-26 NOTE — Progress Notes (Signed)
Pre visit review using our clinic review tool, if applicable. No additional management support is needed unless otherwise documented below in the visit note. 

## 2016-11-27 ENCOUNTER — Telehealth: Payer: Self-pay | Admitting: *Deleted

## 2016-11-27 NOTE — Telephone Encounter (Signed)
Patient called and denied medicare wellness appointment.

## 2016-11-27 NOTE — Telephone Encounter (Signed)
-----   Message from Oneta Rack sent at 11/27/2016  8:31 AM EST ----- lvn advising patient to schedule medicare wellness appointment ----- Message ----- From: Dorrene German, RN Sent: 11/27/2016   7:35 AM To: Ann Held, DO, [place]  Yes, he has [facility] Medicare so can have CPE + AWV. Last AWV was in 2015.   Tiffany, will you please reach out to patient to schedule AWV at his convenience?   Thanks, Hoyle Sauer  ----- Message ----- From: Ann Held, DO Sent: 11/26/2016   9:04 PM To: Dorrene German, RN  Pt was seen for cpe today---  Does he need a medicare wellness as well?

## 2016-11-30 ENCOUNTER — Other Ambulatory Visit: Payer: Self-pay | Admitting: Acute Care

## 2016-12-25 ENCOUNTER — Other Ambulatory Visit (INDEPENDENT_AMBULATORY_CARE_PROVIDER_SITE_OTHER): Payer: Medicare Other

## 2016-12-25 ENCOUNTER — Other Ambulatory Visit: Payer: Medicare Other

## 2016-12-25 ENCOUNTER — Other Ambulatory Visit: Payer: Self-pay | Admitting: Internal Medicine

## 2016-12-25 LAB — HEMATOCRIT: HCT: 47.1 % (ref 39.0–52.0)

## 2016-12-25 LAB — FERRITIN: Ferritin: 24 ng/mL (ref 22.0–322.0)

## 2016-12-25 LAB — HEMOGLOBIN: Hemoglobin: 15.8 g/dL (ref 13.0–17.0)

## 2016-12-26 NOTE — Progress Notes (Signed)
Repeat phlebotomy in 2 mos please

## 2017-01-02 ENCOUNTER — Telehealth: Payer: Self-pay | Admitting: Family Medicine

## 2017-01-02 ENCOUNTER — Encounter: Payer: Self-pay | Admitting: *Deleted

## 2017-01-02 NOTE — Telephone Encounter (Signed)
Patient notified and copy mailed

## 2017-01-02 NOTE — Telephone Encounter (Signed)
Relation to EQ:UHKI Call back Devin Schmidt:  Reason for call:  Patient would like 11/26/16 physical labs mailed to home, please advise

## 2017-01-08 ENCOUNTER — Telehealth: Payer: Self-pay | Admitting: Medical

## 2017-01-08 NOTE — Telephone Encounter (Signed)
Looks like pt wants labs mailed to his house. I reviewed labs since envelope was near my work station/folders. But appears I did not order. I will place letter at your desk. Looks like letter needs a stamp.

## 2017-01-09 NOTE — Telephone Encounter (Signed)
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

## 2017-01-30 IMAGING — CT CT CHEST LUNG CANCER SCREENING LOW DOSE W/O CM
2 of 4 series · 15 of 40 positions shown, 18 images · non-contrast
Comparison: None.

CLINICAL DATA: 40 pack-year history.  Former smoker.  Asymptomatic.

EXAM:
CT CHEST WITHOUT CONTRAST LOW-DOSE FOR LUNG CANCER SCREENING
TECHNIQUE: Multidetector CT imaging of the chest was performed following the
standard protocol without IV contrast.

[Series 2: thorax 5.0 i31f 3 · axial · 0.81mm/px · z∈[-344,-54]mm · 12 of 70 slices shown, 15 images]
[im 6/70  mediastinal]
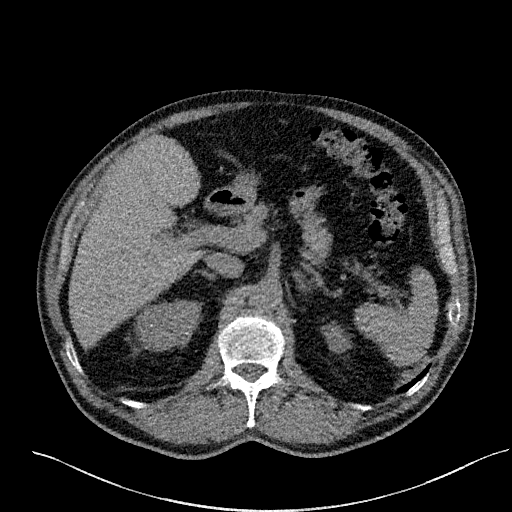
[im 6/70  lung]
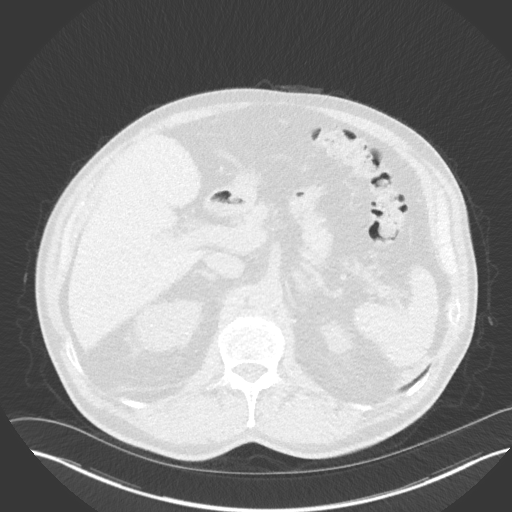
[im 11/70  lung]
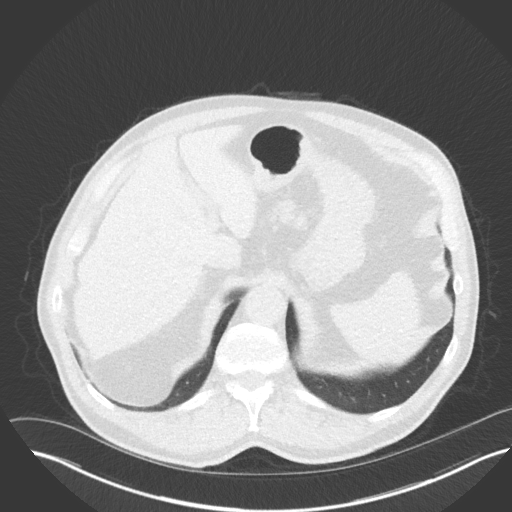
[im 16/70  lung]
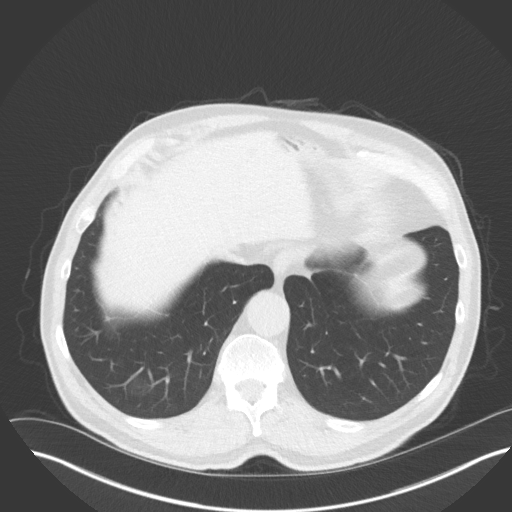
[im 22/70  lung]
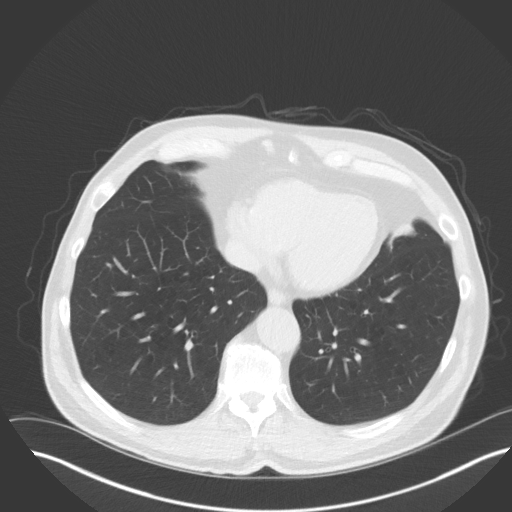
[im 27/70  mediastinal]
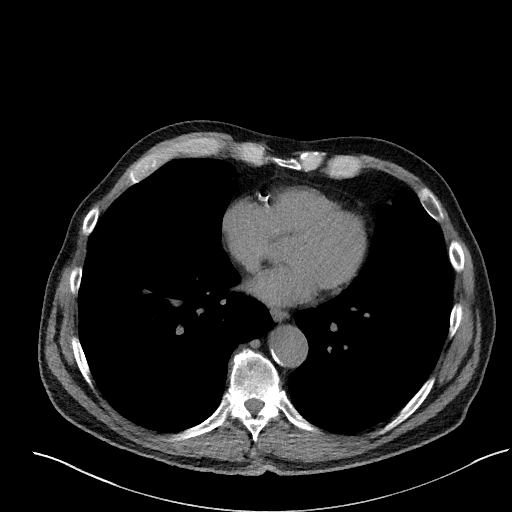
[im 27/70  lung]
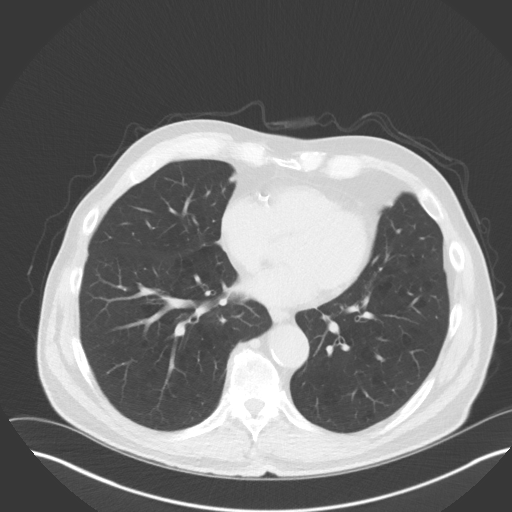
[im 32/70  lung]
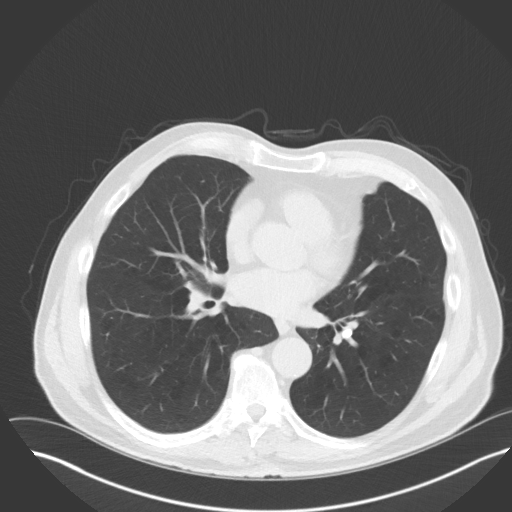
[im 38/70  lung]
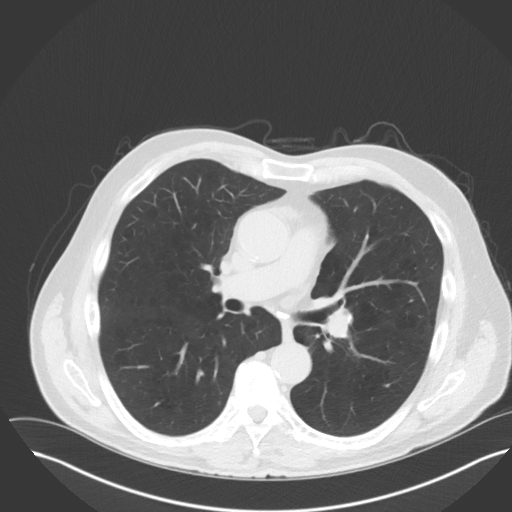
[im 43/70  lung]
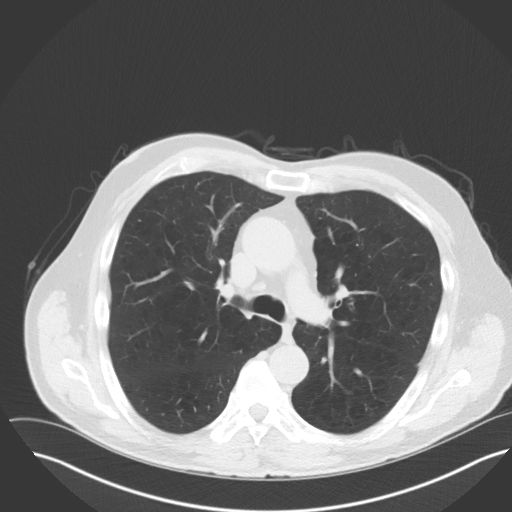
[im 48/70  mediastinal]
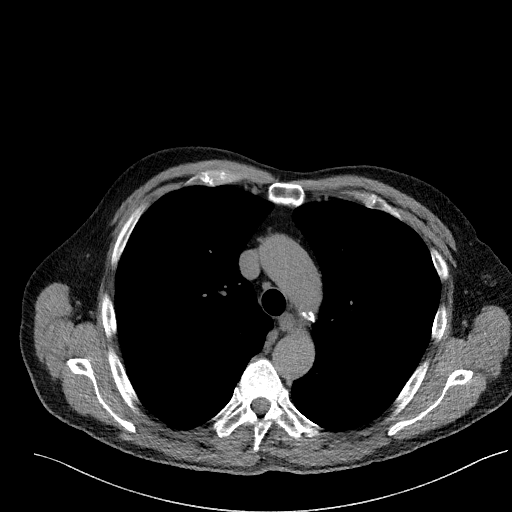
[im 48/70  lung]
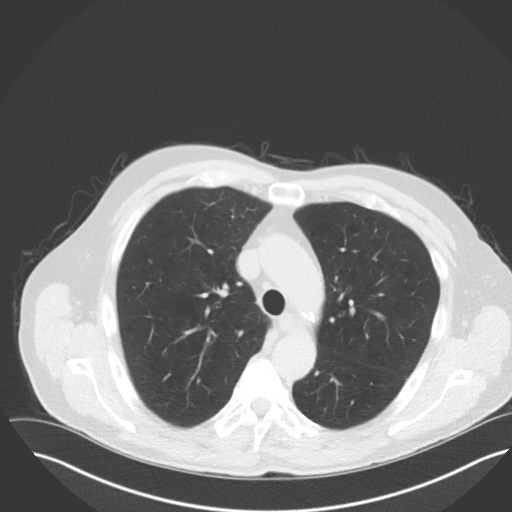
[im 54/70  lung]
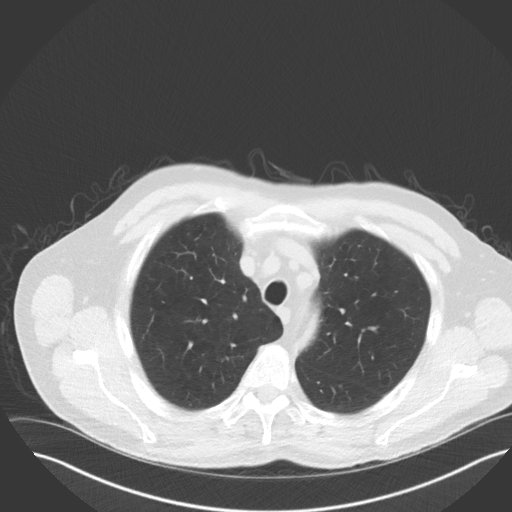
[im 59/70  lung]
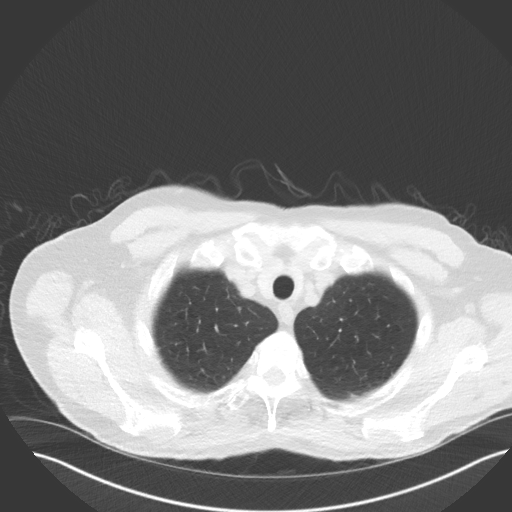
[im 64/70  lung]
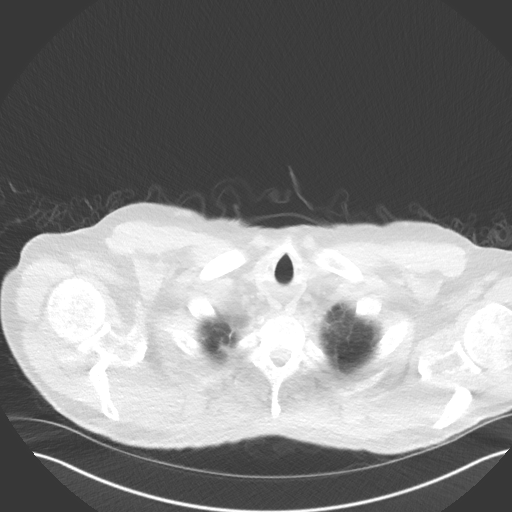

[Series 5: coronal · coronal · 0.59mm/px · 3 of 153 slices shown]
[im 31/153  lung]
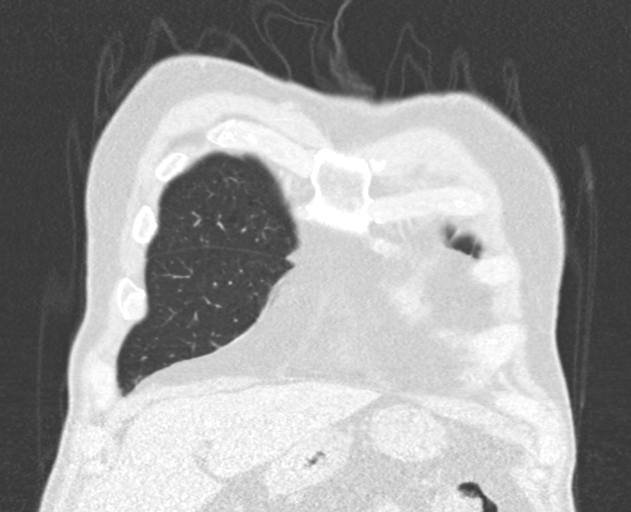
[im 61/153  lung]
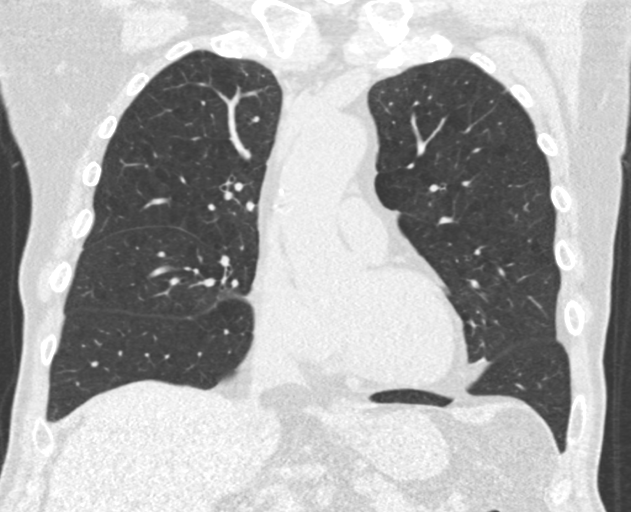
[im 92/153  lung]
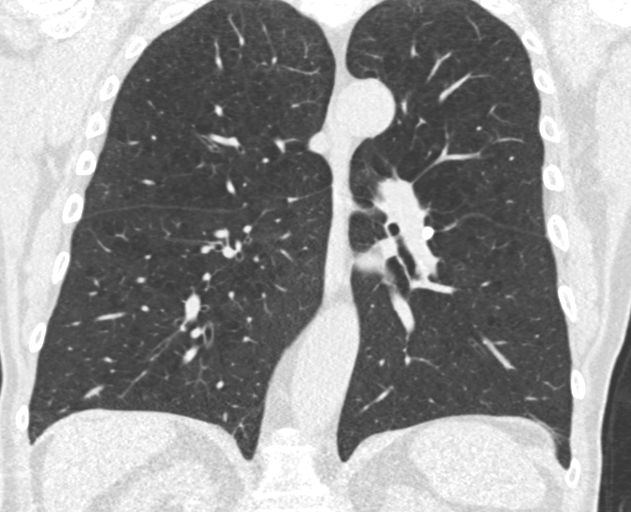

[15 of 40 positions shown; findings below may reference images not displayed]

FINDINGS: Mediastinum/Nodes: The heart size appears normal. Aortic
atherosclerosis is identified. Calcification within the LAD and RCA
coronary artery noted. Calcified mediastinal and left hilar lymph
nodes identified. No mediastinal or hilar adenopathy. No axillary or
supraclavicular adenopathy.

Lungs/Pleura: The pleural effusions identified. Moderate changes of
centrilobular emphysema identified. There is a small muscle in the
left upper lobe which has an equivalent diameter of 3.2 mm, image 27
of series 2.

Upper abdomen: Stones are identified within the dependent portion of
the gallbladder. Low density structure within the dome of liver
measures 7 mm and is too small to characterize. The liver has a
somewhat irregular contour or an a heterogeneous attenuation
appearance. Calcified granulomas noted within the spleen. The
adrenal glands are unremarkable. Visualized portions of the kidneys
are normal.

Musculoskeletal: Spondylosis noted within the thoracic spine. No
aggressive lytic or sclerotic bone lesion identified.
IMPRESSION: 1. Lung-RADS Category 2, benign appearance or behavior. Continue
annual screening with low-dose chest CT without contrast in 12
months
2. Emphysema
3. Aortic atherosclerosis and coronary artery calcification.
4. Prior granulomatous disease.
5. Gallstones.

## 2017-02-28 ENCOUNTER — Other Ambulatory Visit (INDEPENDENT_AMBULATORY_CARE_PROVIDER_SITE_OTHER): Payer: Medicare Other

## 2017-02-28 LAB — HEMOGLOBIN: Hemoglobin: 15.2 g/dL (ref 13.0–17.0)

## 2017-02-28 LAB — HEMATOCRIT: HCT: 44.2 % (ref 39.0–52.0)

## 2017-03-04 NOTE — Progress Notes (Signed)
Repeat phlebotomy in 2 mos please

## 2017-03-21 ENCOUNTER — Other Ambulatory Visit: Payer: Self-pay | Admitting: Family Medicine

## 2017-03-21 DIAGNOSIS — G250 Essential tremor: Secondary | ICD-10-CM

## 2017-03-21 MED ORDER — PRIMIDONE 50 MG PO TABS: 50.0000 mg | ORAL_TABLET | Freq: Every day | ORAL | 3 refills | Status: DC

## 2017-03-21 NOTE — Telephone Encounter (Signed)
Caller name: Relationship to patient: Self Can be reached: [phone] Pharmacy:  [place], [place]. [phone] (Phone) [phone] (Fax)     Reason for call: Refill 60 day supply of primidone (MYSOLINE) 50 MG tablet [[id number]

## 2017-04-22 ENCOUNTER — Other Ambulatory Visit (INDEPENDENT_AMBULATORY_CARE_PROVIDER_SITE_OTHER): Payer: Medicare Other

## 2017-04-22 LAB — HEMATOCRIT: HEMATOCRIT: 44.3 % (ref 39.0–52.0)

## 2017-04-22 LAB — HEMOGLOBIN: Hemoglobin: 15 g/dL (ref 13.0–17.0)

## 2017-04-25 NOTE — Progress Notes (Signed)
Repeat phlebotomy in 2 mos

## 2017-05-09 ENCOUNTER — Encounter: Payer: Self-pay | Admitting: Internal Medicine

## 2017-06-26 ENCOUNTER — Other Ambulatory Visit (INDEPENDENT_AMBULATORY_CARE_PROVIDER_SITE_OTHER): Payer: Medicare Other

## 2017-06-26 ENCOUNTER — Other Ambulatory Visit: Payer: Self-pay

## 2017-06-26 LAB — HEMOGLOBIN: HEMOGLOBIN: 14.6 g/dL (ref 13.0–17.0)

## 2017-06-26 LAB — FERRITIN: FERRITIN: 18.4 ng/mL — AB (ref 22.0–322.0)

## 2017-06-26 LAB — HEMATOCRIT: HEMATOCRIT: 44.6 % (ref 39.0–52.0)

## 2017-06-26 NOTE — Progress Notes (Signed)
Repeat phlebotomy in 2 mos

## 2017-08-07 ENCOUNTER — Ambulatory Visit (INDEPENDENT_AMBULATORY_CARE_PROVIDER_SITE_OTHER): Payer: Medicare Other | Admitting: Internal Medicine

## 2017-08-07 ENCOUNTER — Encounter: Payer: Self-pay | Admitting: Internal Medicine

## 2017-08-07 ENCOUNTER — Encounter (INDEPENDENT_AMBULATORY_CARE_PROVIDER_SITE_OTHER): Payer: Self-pay

## 2017-08-07 DIAGNOSIS — Z86010 Personal history of colonic polyps: Secondary | ICD-10-CM | POA: Diagnosis not present

## 2017-08-07 DIAGNOSIS — K602 Anal fissure, unspecified: Secondary | ICD-10-CM | POA: Diagnosis not present

## 2017-08-07 MED ORDER — AMBULATORY NON FORMULARY MEDICATION: 2 refills | Status: DC

## 2017-08-07 NOTE — Assessment & Plan Note (Signed)
He will call back for colonoscopy 2019

## 2017-08-07 NOTE — Assessment & Plan Note (Signed)
Switch to Liodcaine/Diltiazem cream

## 2017-08-07 NOTE — Patient Instructions (Addendum)
  We are faxing your rx to [facility] for your rectal cream to use as needed.   Call us back in March to set up your colonoscopy for next summer.  Per Dr Carlean Purl okay to have your phlebotomy done every 3 months.    I appreciate the opportunity to care for you. Silvano Rusk, MD, [facility]

## 2017-08-07 NOTE — Progress Notes (Signed)
   Devin Schmidt 76 y.o. 1941-07-30 [id number]  Assessment & Plan:  Hereditary hemochromatosis ([place]) Move phlebotomy to q 3 mos  Anal fissure - posterior Switch to Liodcaine/Diltiazem cream  Personal history of colonic polyps-adenomas He will call back for colonoscopy 2019  I appreciate the opportunity to care for this patient. CC: Ann Held, DO   Subjective:   Chief Complaint: History of colon polyps, hereditary hemochromatosis and anal fissure here for follow-up  HPI Devin Schmidt is doing reasonably well though he says his anal fissure bothers him a little bit at times and still uses the medication intermittently.  He asks questions about his phlebotomy and whether or not he needs to have it is often, sometimes they have trouble with his veins but in general the phlebotomist do a good job.  His last ferritin was 18.  He has a history of adenomatous colon polyps having 6 adenomas 3 years ago.  He is open to the idea of having another colonoscopy.  He remains overall very fit and active.  He would like to schedule a colonoscopy in 2019. No Known Allergies Current Meds  Medication Sig  . AMBULATORY NON FORMULARY MEDICATION Medication Name: Nitro 0.125 % ointment use to anal area twice daily.  . NONFORMULARY OR COMPOUNDED ITEM 1 application 2 (two) times daily. Apply into both ears twice daily as needed for itching.   Nystatin 20g/Triamcinolone 20g/Aquaphor 20g  #60g  . primidone (MYSOLINE) 50 MG tablet Take 1 tablet (50 mg total) by mouth at bedtime.   Past Medical History:  Diagnosis Date  . Colon polyps    Tubular Adenoma and Hyperplastic   . Hemochromatosis    HX OF  . Inflamed seborrheic keratosis   . URI (upper respiratory infection)    Past Surgical History:  Procedure Laterality Date  . APPENDECTOMY  1950's  . COLONOSCOPY W/ BIOPSIES  10/20/2008   X 2  . HEMORRHOID BANDING    . INGUINAL HERNIA REPAIR Right 02/01/2015  . INGUINAL HERNIA REPAIR N/A 02/01/2015     Procedure: LAPAROSCOPIC RIGHT INGUINAL HERNIA REPAIR;  Surgeon: Doreen Salvage, MD;  Location: [facility];  Service: General;  Laterality: N/A;  . INSERTION OF MESH Right 02/01/2015   Procedure: INSERTION OF MESH;  Surgeon: Doreen Salvage, MD;  Location: [facility];  Service: General;  Laterality: Right;  . TONSILLECTOMY  1950's   Review of Systems  As per HPI Objective:   Physical Exam BP 100/62   Pulse 78   Ht 6' (1.829 m)   Wt 182 lb (82.6 kg)   BMI 24.68 kg/m  No acute distress  15 minutes time spent with patient > half in counseling coordination of care

## 2017-08-07 NOTE — Assessment & Plan Note (Signed)
Move phlebotomy to q 3 mos

## 2017-09-23 ENCOUNTER — Other Ambulatory Visit (INDEPENDENT_AMBULATORY_CARE_PROVIDER_SITE_OTHER): Payer: Medicare Other

## 2017-09-23 LAB — HEMOGLOBIN: HEMOGLOBIN: 15.7 g/dL (ref 13.0–17.0)

## 2017-09-23 LAB — HEMATOCRIT: HCT: 46.5 % (ref 39.0–52.0)

## 2017-09-23 NOTE — Progress Notes (Signed)
Continue phlebotomy every other month

## 2017-11-26 ENCOUNTER — Other Ambulatory Visit (INDEPENDENT_AMBULATORY_CARE_PROVIDER_SITE_OTHER): Payer: Medicare Other

## 2017-11-26 LAB — CBC WITH DIFFERENTIAL/PLATELET
BASOS ABS: 0 10*3/uL (ref 0.0–0.1)
Basophils Relative: 0.5 % (ref 0.0–3.0)
EOS ABS: 0.2 10*3/uL (ref 0.0–0.7)
Eosinophils Relative: 2.3 % (ref 0.0–5.0)
HEMATOCRIT: 44.5 % (ref 39.0–52.0)
Hemoglobin: 15.2 g/dL (ref 13.0–17.0)
LYMPHS PCT: 12.9 % (ref 12.0–46.0)
Lymphs Abs: 0.9 10*3/uL (ref 0.7–4.0)
MCHC: 34.1 g/dL (ref 30.0–36.0)
MCV: 98.3 fl (ref 78.0–100.0)
Monocytes Absolute: 0.6 10*3/uL (ref 0.1–1.0)
Monocytes Relative: 8.7 % (ref 3.0–12.0)
NEUTROS ABS: 5.3 10*3/uL (ref 1.4–7.7)
Neutrophils Relative %: 75.6 % (ref 43.0–77.0)
PLATELETS: 154 10*3/uL (ref 150.0–400.0)
RBC: 4.53 Mil/uL (ref 4.22–5.81)
RDW: 12.9 % (ref 11.5–15.5)
WBC: 7 10*3/uL (ref 4.0–10.5)

## 2017-12-01 NOTE — Progress Notes (Signed)
Continue phlebotomy every other month Check ferritin next time

## 2017-12-02 ENCOUNTER — Other Ambulatory Visit: Payer: Self-pay

## 2017-12-13 ENCOUNTER — Ambulatory Visit (INDEPENDENT_AMBULATORY_CARE_PROVIDER_SITE_OTHER): Payer: Medicare Other | Admitting: Family Medicine

## 2017-12-13 ENCOUNTER — Encounter: Payer: Self-pay | Admitting: Family Medicine

## 2017-12-13 VITALS — BP 112/60 | HR 57 | Temp 98.0°F | Resp 16 | Ht 72.0 in | Wt 187.0 lb

## 2017-12-13 DIAGNOSIS — L309 Dermatitis, unspecified: Secondary | ICD-10-CM | POA: Diagnosis not present

## 2017-12-13 DIAGNOSIS — Z0000 Encounter for general adult medical examination without abnormal findings: Secondary | ICD-10-CM | POA: Diagnosis not present

## 2017-12-13 DIAGNOSIS — G250 Essential tremor: Secondary | ICD-10-CM | POA: Diagnosis not present

## 2017-12-13 LAB — LIPID PANEL
CHOLESTEROL: 184 mg/dL (ref 0–200)
HDL: 57.8 mg/dL (ref >39.00)
LDL CALC: 100 mg/dL — AB (ref 0–99)
NonHDL: 125.82
TRIGLYCERIDES: 130 mg/dL (ref 0.0–149.0)
Total CHOL/HDL Ratio: 3
VLDL: 26 mg/dL (ref 0.0–40.0)

## 2017-12-13 LAB — COMPREHENSIVE METABOLIC PANEL
ALBUMIN: 4.2 g/dL (ref 3.5–5.2)
ALT: 12 U/L (ref 0–53)
AST: 14 U/L (ref 0–37)
Alkaline Phosphatase: 84 U/L (ref 39–117)
BUN: 19 mg/dL (ref 6–23)
CALCIUM: 9.1 mg/dL (ref 8.4–10.5)
CHLORIDE: 104 meq/L (ref 96–112)
CO2: 31 mEq/L (ref 19–32)
CREATININE: 1.24 mg/dL (ref 0.40–1.50)
GFR: 59.76 mL/min — AB (ref >60.00)
Glucose, Bld: 84 mg/dL (ref 70–99)
POTASSIUM: 4.7 meq/L (ref 3.5–5.1)
Sodium: 139 mEq/L (ref 135–145)
Total Bilirubin: 0.7 mg/dL (ref 0.2–1.2)
Total Protein: 6.4 g/dL (ref 6.0–8.3)

## 2017-12-13 LAB — CBC WITH DIFFERENTIAL/PLATELET
BASOS PCT: 0.5 % (ref 0.0–3.0)
Basophils Absolute: 0 10*3/uL (ref 0.0–0.1)
EOS PCT: 3.8 % (ref 0.0–5.0)
Eosinophils Absolute: 0.2 10*3/uL (ref 0.0–0.7)
HCT: 45.3 % (ref 39.0–52.0)
HEMOGLOBIN: 15.3 g/dL (ref 13.0–17.0)
LYMPHS PCT: 17.3 % (ref 12.0–46.0)
Lymphs Abs: 0.9 10*3/uL (ref 0.7–4.0)
MCHC: 33.7 g/dL (ref 30.0–36.0)
MCV: 100 fl (ref 78.0–100.0)
MONOS PCT: 11.8 % (ref 3.0–12.0)
Monocytes Absolute: 0.6 10*3/uL (ref 0.1–1.0)
Neutro Abs: 3.6 10*3/uL (ref 1.4–7.7)
Neutrophils Relative %: 66.6 % (ref 43.0–77.0)
Platelets: 176 10*3/uL (ref 150.0–400.0)
RBC: 4.53 Mil/uL (ref 4.22–5.81)
RDW: 13.2 % (ref 11.5–15.5)
WBC: 5.4 10*3/uL (ref 4.0–10.5)

## 2017-12-13 LAB — PSA: PSA: 1.16 ng/mL (ref 0.10–4.00)

## 2017-12-13 MED ORDER — PRIMIDONE 50 MG PO TABS: 50.0000 mg | ORAL_TABLET | Freq: Every day | ORAL | 1 refills | Status: DC

## 2017-12-13 MED ORDER — MOMETASONE FUROATE 0.1 % EX CREA: TOPICAL_CREAM | CUTANEOUS | 1 refills | Status: DC

## 2017-12-13 NOTE — Progress Notes (Signed)
Patient ID: Devin Schmidt, male    DOB: October 02, 1941  Age: 77 y.o. MRN: [id number]    Subjective:  Subjective  HPI Devin Schmidt presents for cpe and labs.  He also needs some refills.  No complaints.   Review of Systems  Constitutional: Negative.   HENT: Negative for congestion, ear pain, hearing loss, nosebleeds, postnasal drip, rhinorrhea, sinus pressure, sneezing and tinnitus.   Eyes: Negative for photophobia, discharge, itching and visual disturbance.  Respiratory: Negative.   Cardiovascular: Negative.   Gastrointestinal: Negative for abdominal distention, abdominal pain, anal bleeding, blood in stool and constipation.  Endocrine: Negative.   Genitourinary: Negative.   Musculoskeletal: Negative.   Skin: Negative.   Allergic/Immunologic: Negative.   Neurological: Negative for dizziness, weakness, light-headedness, numbness and headaches.  Psychiatric/Behavioral: Negative for agitation, confusion, decreased concentration, dysphoric mood, sleep disturbance and suicidal ideas. The patient is not nervous/anxious.     History Past Medical History:  Diagnosis Date  . Colon polyps    Tubular Adenoma and Hyperplastic   . Hemochromatosis    HX OF  . Inflamed seborrheic keratosis   . URI (upper respiratory infection)     He has a past surgical history that includes Colonoscopy w/ biopsies (10/20/2008); Hemorrhoid banding; Inguinal hernia repair (Right, 02/01/2015); Appendectomy (1950's); Tonsillectomy (1950's); Inguinal hernia repair (N/A, 02/01/2015); and Insertion of mesh (Right, 02/01/2015).   His family history is not on file.He reports that he quit smoking about 8 years ago. His smoking use included cigarettes. He has a 50.00 pack-year smoking history. he has never used smokeless tobacco. He reports that he drinks about 1.2 oz of alcohol per week. He reports that he does not use drugs.  Current Outpatient Medications on File Prior to Visit  Medication Sig Dispense Refill  .  AMBULATORY NON FORMULARY MEDICATION Medication Name: Nitro 0.125 % ointment use to anal area twice daily. 1 Tube 3  . AMBULATORY NON FORMULARY MEDICATION Lidocaine 5% mixed with Diltiazem gel 2% : Apply rectal twice a day as needed. 30 g 2   No current facility-administered medications on file prior to visit.      Objective:  Objective  Physical Exam  Constitutional: He is oriented to person, place, and time. He appears well-developed and well-nourished. No distress.  HENT:  Head: Normocephalic and atraumatic.  Right Ear: External ear normal.  Left Ear: External ear normal.  Nose: Nose normal.  Mouth/Throat: Oropharynx is clear and moist. No oropharyngeal exudate.  Eyes: Conjunctivae and EOM are normal. Pupils are equal, round, and reactive to light. Right eye exhibits no discharge. Left eye exhibits no discharge.  Neck: Normal range of motion. Neck supple. No JVD present. No thyromegaly present.  Cardiovascular: Normal rate, regular rhythm and intact distal pulses. Exam reveals no gallop and no friction rub.  No murmur heard. Pulmonary/Chest: Effort normal and breath sounds normal. No respiratory distress. He has no wheezes. He has no rales. He exhibits no tenderness.  Abdominal: Soft. Bowel sounds are normal. He exhibits no distension and no mass. There is no tenderness. There is no rebound and no guarding.  Genitourinary: Rectum normal, prostate normal and penis normal. Rectal exam shows guaiac negative stool.  Musculoskeletal: Normal range of motion. He exhibits no edema or tenderness.  Lymphadenopathy:    He has no cervical adenopathy.  Neurological: He is alert and oriented to person, place, and time. He displays normal reflexes. He exhibits normal muscle tone.  Skin: Skin is warm and dry. No rash noted. He is  not diaphoretic. No erythema. No pallor.  Psychiatric: He has a normal mood and affect. His behavior is normal. Judgment and thought content normal.  Nursing note and vitals  reviewed.  BP 112/60 (BP Location: Right Arm, Cuff Size: Normal)   Pulse (!) 57   Temp 98 F (36.7 C) (Oral)   Resp 16   Ht 6' (1.829 m)   Wt 187 lb (84.8 kg)   SpO2 98%   BMI 25.36 kg/m  Wt Readings from Last 3 Encounters:  12/13/17 187 lb (84.8 kg)  08/07/17 182 lb (82.6 kg)  11/26/16 184 lb 9.6 oz (83.7 kg)     Lab Results  Component Value Date   WBC 7.0 11/26/2017   HGB 15.2 11/26/2017   HCT 44.5 11/26/2017   PLT 154.0 11/26/2017   GLUCOSE 85 11/26/2016   CHOL 168 11/26/2016   TRIG 117.0 11/26/2016   HDL 61.70 11/26/2016   LDLCALC 82 11/26/2016   ALT 12 11/26/2016   AST 14 11/26/2016   NA 139 11/26/2016   K 4.3 11/26/2016   CL 103 11/26/2016   CREATININE 1.20 11/26/2016   BUN 18 11/26/2016   CO2 32 11/26/2016   TSH 0.60 11/20/2012   PSA 1.14 11/26/2016    Ct Abdomen Pelvis Wo Contrast  Result Date: 07/02/2016 CLINICAL DATA:  History of inguinal hernia or repair. Right groin pain for 2 days. EXAM: CT ABDOMEN AND PELVIS WITHOUT CONTRAST TECHNIQUE: Multidetector CT imaging of the abdomen and pelvis was performed following the standard protocol without IV contrast. COMPARISON:  None. FINDINGS: Lower chest: Lung bases are clear. No effusions. Heart is normal size. Hepatobiliary: Small hypodensities in the liver compatible with small cysts. No additional focal hepatic abnormality. Layering small gallstones in the gallbladder. No biliary ductal dilatation. Pancreas: No focal abnormality or ductal dilatation. Spleen: Calcifications throughout the spleen compatible granulomatous disease. Normal size. Adrenals/Urinary Tract: Bilateral renal perinephric stranding, likely related to old insult. No hydronephrosis or focal abnormality. No visible stones. Adrenal glands and urinary bladder unremarkable. Stomach/Bowel: Stomach, large and small bowel grossly unremarkable. Vascular/Lymphatic: Diffuse aortic and iliac calcifications. No aneurysm. No adenopathy. Reproductive: Markedly  enlarged prostate, measuring 7.2 x 7.0 cm. Other: No free fluid or free air. Postoperative changes from right inguinal hernia repair. No recurrent or residual hernia on the right. There is a small to moderate-sized left inguinal hernia containing fat. Musculoskeletal: No acute bony abnormality. Degenerative changes in the lumbar spine. IMPRESSION: Cholelithiasis. 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 Prior right inguinal hernia repair without recurrent hernia. Small to moderate left inguinal hernia containing fat. Aortoiliac atherosclerosis. Electronically Signed   By: Rolm Baptise M.D.   On: 07/02/2016 11:12     Assessment & Plan:  Plan  I have discontinued Ky Rumple. Danish's NONFORMULARY OR COMPOUNDED ITEM. I have also changed his mometasone. Additionally, I am having him maintain his AMBULATORY NON FORMULARY MEDICATION, AMBULATORY NON FORMULARY MEDICATION, and primidone.  Meds ordered this encounter  Medications  . primidone (MYSOLINE) 50 MG tablet    Sig: Take 1 tablet (50 mg total) by mouth at bedtime.    Dispense:  90 tablet    Refill:  1    Please put on hold until patient calls to refill.  Thanks  . mometasone (ELOCON) 0.1 % cream    Sig: Apply into both ears at bedtime as needed for itching    Dispense:  45 g    Refill:  1    Problem List Items Addressed This Visit  Unprioritized   Essential tremor   Relevant Medications   primidone (MYSOLINE) 50 MG tablet   Other Relevant Orders   CBC with Differential/Platelet   Comprehensive metabolic panel   Lipid panel   PSA    Other Visit Diagnoses    Eczema, unspecified type    -  Primary   Relevant Medications   mometasone (ELOCON) 0.1 % cream   Other Relevant Orders   CBC with Differential/Platelet   Comprehensive metabolic panel   Lipid panel   PSA      Follow-up: Return in about 1 year (around 12/13/2018) for annual exam.  Ann Held, DO

## 2017-12-13 NOTE — Patient Instructions (Signed)
Preventive Care 79 Years and Older, Male Preventive care refers to lifestyle choices and visits with your health care provider that can promote health and wellness. What does preventive care include?  A yearly physical exam. This is also called an annual well check.  Dental exams once or twice a year.  Routine eye exams. Ask your health care provider how often you should have your eyes checked.  Personal lifestyle choices, including: ? Daily care of your teeth and gums. ? Regular physical activity. ? Eating a healthy diet. ? Avoiding tobacco and drug use. ? Limiting alcohol use. ? Practicing safe sex. ? Taking low doses of aspirin every day. ? Taking vitamin and mineral supplements as recommended by your health care provider. What happens during an annual well check? The services and screenings done by your health care provider during your annual well check will depend on your age, overall health, lifestyle risk factors, and family history of disease. Counseling Your health care provider may ask you questions about your:  Alcohol use.  Tobacco use.  Drug use.  Emotional well-being.  Home and relationship well-being.  Sexual activity.  Eating habits.  History of falls.  Memory and ability to understand (cognition).  Work and work environment.  Screening You may have the following tests or measurements:  Height, weight, and BMI.  Blood pressure.  Lipid and cholesterol levels. These may be checked every 5 years, or more frequently if you are over 50 years old.  Skin check.  Lung cancer screening. You may have this screening every year starting at age 55 if you have a 30-pack-year history of smoking and currently smoke or have quit within the past 15 years.  Fecal occult blood test (FOBT) of the stool. You may have this test every year starting at age 50.  Flexible sigmoidoscopy or colonoscopy. You may have a sigmoidoscopy every 5 years or a colonoscopy every 10  years starting at age 50.  Prostate cancer screening. Recommendations will vary depending on your family history and other risks.  Hepatitis C blood test.  Hepatitis B blood test.  Sexually transmitted disease (STD) testing.  Diabetes screening. This is done by checking your blood sugar (glucose) after you have not eaten for a while (fasting). You may have this done every 1-3 years.  Abdominal aortic aneurysm (AAA) screening. You may need this if you are a current or former smoker.  Osteoporosis. You may be screened starting at age 70 if you are at high risk.  Talk with your health care provider about your test results, treatment options, and if necessary, the need for more tests. Vaccines Your health care provider may recommend certain vaccines, such as:  Influenza vaccine. This is recommended every year.  Tetanus, diphtheria, and acellular pertussis (Tdap, Td) vaccine. You may need a Td booster every 10 years.  Varicella vaccine. You may need this if you have not been vaccinated.  Zoster vaccine. You may need this after age 60.  Measles, mumps, and rubella (MMR) vaccine. You may need at least one dose of MMR if you were born in 1957 or later. You may also need a second dose.  Pneumococcal 13-valent conjugate (PCV13) vaccine. One dose is recommended after age 65.  Pneumococcal polysaccharide (PPSV23) vaccine. One dose is recommended after age 65.  Meningococcal vaccine. You may need this if you have certain conditions.  Hepatitis A vaccine. You may need this if you have certain conditions or if you travel or work in places where you   may be exposed to hepatitis A.  Hepatitis B vaccine. You may need this if you have certain conditions or if you travel or work in places where you may be exposed to hepatitis B.  Haemophilus influenzae type b (Hib) vaccine. You may need this if you have certain risk factors.  Talk to your health care provider about which screenings and vaccines  you need and how often you need them. This information is not intended to replace advice given to you by your health care provider. Make sure you discuss any questions you have with your health care provider. Document Released: 11/04/2015 Document Revised: 06/27/2016 Document Reviewed: 08/09/2015 Elsevier Interactive Patient Education  2018 Elsevier Inc.  

## 2017-12-13 NOTE — Assessment & Plan Note (Addendum)
See avs ghm utd Check labs Pt refuses vaccines

## 2017-12-17 ENCOUNTER — Encounter: Payer: Self-pay | Admitting: *Deleted

## 2017-12-27 ENCOUNTER — Encounter: Payer: Self-pay | Admitting: Internal Medicine

## 2018-01-22 ENCOUNTER — Other Ambulatory Visit: Payer: Self-pay | Admitting: Internal Medicine

## 2018-01-22 ENCOUNTER — Other Ambulatory Visit (INDEPENDENT_AMBULATORY_CARE_PROVIDER_SITE_OTHER): Payer: Medicare Other

## 2018-01-22 LAB — CBC WITH DIFFERENTIAL/PLATELET
BASOS PCT: 0.4 % (ref 0.0–3.0)
Basophils Absolute: 0 10*3/uL (ref 0.0–0.1)
EOS ABS: 0.2 10*3/uL (ref 0.0–0.7)
EOS PCT: 2.3 % (ref 0.0–5.0)
HEMATOCRIT: 46.8 % (ref 39.0–52.0)
HEMOGLOBIN: 15.9 g/dL (ref 13.0–17.0)
LYMPHS PCT: 11.5 % — AB (ref 12.0–46.0)
Lymphs Abs: 0.9 10*3/uL (ref 0.7–4.0)
MCHC: 34 g/dL (ref 30.0–36.0)
MCV: 98.6 fl (ref 78.0–100.0)
MONOS PCT: 8.1 % (ref 3.0–12.0)
Monocytes Absolute: 0.6 10*3/uL (ref 0.1–1.0)
NEUTROS ABS: 5.9 10*3/uL (ref 1.4–7.7)
Neutrophils Relative %: 77.7 % — ABNORMAL HIGH (ref 43.0–77.0)
PLATELETS: 170 10*3/uL (ref 150.0–400.0)
RBC: 4.75 Mil/uL (ref 4.22–5.81)
RDW: 12.8 % (ref 11.5–15.5)
WBC: 7.6 10*3/uL (ref 4.0–10.5)

## 2018-01-22 LAB — FERRITIN: Ferritin: 24.4 ng/mL (ref 22.0–322.0)

## 2018-01-27 NOTE — Progress Notes (Signed)
Continue every 2 month phlebotomy

## 2018-03-06 ENCOUNTER — Encounter: Payer: Self-pay | Admitting: Internal Medicine

## 2018-03-06 ENCOUNTER — Ambulatory Visit (AMBULATORY_SURGERY_CENTER): Payer: Self-pay

## 2018-03-06 ENCOUNTER — Other Ambulatory Visit: Payer: Self-pay

## 2018-03-06 VITALS — Ht 72.0 in | Wt 185.6 lb

## 2018-03-06 DIAGNOSIS — Z86010 Personal history of colonic polyps: Secondary | ICD-10-CM

## 2018-03-06 NOTE — Progress Notes (Signed)
Denies allergies to eggs or soy products. Denies complication of anesthesia or sedation. Denies use of weight loss medication. Denies use of O2.   Emmi instructions declined.  

## 2018-03-13 ENCOUNTER — Ambulatory Visit (AMBULATORY_SURGERY_CENTER): Payer: Medicare Other | Admitting: Internal Medicine

## 2018-03-13 ENCOUNTER — Other Ambulatory Visit: Payer: Self-pay

## 2018-03-13 ENCOUNTER — Encounter: Payer: Self-pay | Admitting: Internal Medicine

## 2018-03-13 VITALS — BP 131/99 | HR 55 | Temp 98.9°F | Resp 16 | Ht 72.0 in | Wt 187.0 lb

## 2018-03-13 DIAGNOSIS — Z1211 Encounter for screening for malignant neoplasm of colon: Secondary | ICD-10-CM | POA: Diagnosis not present

## 2018-03-13 DIAGNOSIS — Z86010 Personal history of colonic polyps: Secondary | ICD-10-CM

## 2018-03-13 MED ORDER — SODIUM CHLORIDE 0.9 % IV SOLN: 500.0000 mL | INTRAVENOUS | Status: DC

## 2018-03-13 NOTE — Patient Instructions (Addendum)
   No polyps today!  No more routine testing of the colon.  Have great summer.  I appreciate the opportunity to care for you.  Gatha Mayer, MD, FACG    YOU HAD AN ENDOSCOPIC PROCEDURE TODAY AT [facility]:   Refer to the procedure report that was given to you for any specific questions about what was found during the examination.  If the procedure report does not answer your questions, please call your gastroenterologist to clarify.  If you requested that your care partner not be given the details of your procedure findings, then the procedure report has been included in a sealed envelope for you to review at your convenience later.  YOU SHOULD EXPECT: Some feelings of bloating in the abdomen. Passage of more gas than usual.  Walking can help get rid of the air that was put into your GI tract during the procedure and reduce the bloating. If you had a lower endoscopy (such as a colonoscopy or flexible sigmoidoscopy) you may notice spotting of blood in your stool or on the toilet paper. If you underwent a bowel prep for your procedure, you may not have a normal bowel movement for a few days.  Please Note:  You might notice some irritation and congestion in your nose or some drainage.  This is from the oxygen used during your procedure.  There is no need for concern and it should clear up in a day or so.  SYMPTOMS TO REPORT IMMEDIATELY:   Following lower endoscopy (colonoscopy or flexible sigmoidoscopy):  Excessive amounts of blood in the stool  Significant tenderness or worsening of abdominal pains  Swelling of the abdomen that is new, acute  Fever of 100F or higher   For urgent or emergent issues, a gastroenterologist can be reached at any hour by calling [phone].   DIET:  We do recommend a small meal at first, but then you may proceed to your regular diet.  Drink plenty of fluids but you should avoid alcoholic beverages for 24 hours.  ACTIVITY:  You  should plan to take it easy for the rest of today and you should NOT DRIVE or use heavy machinery until tomorrow (because of the sedation medicines used during the test).    FOLLOW UP: Our staff will call the number listed on your records the next business day following your procedure to check on you and address any questions or concerns that you may have regarding the information given to you following your procedure. If we do not reach you, we will leave a message.  However, if you are feeling well and you are not experiencing any problems, there is no need to return our call.  We will assume that you have returned to your regular daily activities without incident.  If any biopsies were taken you will be contacted by phone or by letter within the next 1-3 weeks.  Please call us at [phone] if you have not heard about the biopsies in 3 weeks.    SIGNATURES/CONFIDENTIALITY: You and/or your care partner have signed paperwork which will be entered into your electronic medical record.  These signatures attest to the fact that that the information above on your After Visit Summary has been reviewed and is understood.  Full responsibility of the confidentiality of this discharge information lies with you and/or your care-partner.

## 2018-03-13 NOTE — Op Note (Signed)
[facility] Patient Name: Devin Schmidt Procedure Date: 03/13/2018 8:42 AM MRN: [id number] Endoscopist: Gatha Mayer , MD Age: 77 Referring MD:  Date of Birth: 02-28-1941 Gender: Male Account #: [id number] Procedure:                Colonoscopy Indications:              Surveillance: Personal history of adenomatous                            polyps on last colonoscopy > 3 years ago Medicines:                Propofol per Anesthesia, Monitored Anesthesia Care Procedure:                Pre-Anesthesia Assessment:                           - Prior to the procedure, a History and Physical                            was performed, and patient medications and                            allergies were reviewed. The patient's tolerance of                            previous anesthesia was also reviewed. The risks                            and benefits of the procedure and the sedation                            options and risks were discussed with the patient.                            All questions were answered, and informed consent                            was obtained. Prior Anticoagulants: The patient has                            taken no previous anticoagulant or antiplatelet                            agents. ASA Grade Assessment: II - A patient with                            mild systemic disease. After reviewing the risks                            and benefits, the patient was deemed in                            satisfactory condition to undergo the procedure.  After obtaining informed consent, the colonoscope                            was passed under direct vision. Throughout the                            procedure, the patient's blood pressure, pulse, and                            oxygen saturations were monitored continuously. The                            Colonoscope was introduced through the anus and                             advanced to the the cecum, identified by                            appendiceal orifice and ileocecal valve. The                            quality of the bowel preparation was excellent. The                            colonoscopy was performed without difficulty. The                            patient tolerated the procedure well. The bowel                            preparation used was Miralax. Scope In: 8:52:58 AM Scope Out: 9:11:51 AM Scope Withdrawal Time: 0 hours 15 minutes 15 seconds  Total Procedure Duration: 0 hours 18 minutes 53 seconds  Findings:                 The perianal and digital rectal examinations were                            normal. Pertinent negatives include normal prostate                            (size, shape, and consistency).                           Internal hemorrhoids were found during retroflexion.                           There was a small lipoma, in the ascending colon.                           The exam was otherwise without abnormality on                            direct and retroflexion views. Complications:  No immediate complications. Estimated blood loss:                            None. Estimated Blood Loss:     Estimated blood loss: none. Impression:               - Internal hemorrhoids.                           - Small lipoma in the ascending colon.                           - The examination was otherwise normal on direct                            and retroflexion views.                           - No specimens collected. Recommendation:           - Patient has a contact number available for                            emergencies. The signs and symptoms of potential                            delayed complications were discussed with the                            patient. Return to normal activities tomorrow.                            Written discharge instructions were provided to the                             patient.                           - Resume previous diet.                           - Continue present medications.                           - No repeat colonoscopy due to age and the absence                            of colonic polyps today. Gatha Mayer, MD 03/13/2018 9:25:06 AM This report has been signed electronically.

## 2018-03-13 NOTE — Progress Notes (Signed)
To PACU, VSS. Report to RN.tb 

## 2018-03-14 ENCOUNTER — Telehealth: Payer: Self-pay | Admitting: *Deleted

## 2018-03-14 NOTE — Telephone Encounter (Signed)
  Follow up Call-  Call back number 03/13/2018  Post procedure Call Back phone  # [phone]  Permission to leave phone message Yes  Some recent data might be hidden     Patient questions:  Do you have a fever, pain , or abdominal swelling? No. Pain Score  0 *  Have you tolerated food without any problems? Yes.    Have you been able to return to your normal activities? Yes.    Do you have any questions about your discharge instructions: Diet   No. Medications  No. Follow up visit  No.  Do you have questions or concerns about your Care? No.  Actions: * If pain score is 4 or above: No action needed, pain <4.

## 2018-03-26 ENCOUNTER — Other Ambulatory Visit: Payer: Self-pay | Admitting: Emergency Medicine

## 2018-03-26 ENCOUNTER — Other Ambulatory Visit (INDEPENDENT_AMBULATORY_CARE_PROVIDER_SITE_OTHER): Payer: Medicare Other

## 2018-03-26 DIAGNOSIS — Z86010 Personal history of colonic polyps: Secondary | ICD-10-CM

## 2018-03-26 LAB — CBC WITH DIFFERENTIAL/PLATELET
BASOS ABS: 0 10*3/uL (ref 0.0–0.1)
BASOS PCT: 0.7 % (ref 0.0–3.0)
Eosinophils Absolute: 0.2 10*3/uL (ref 0.0–0.7)
Eosinophils Relative: 3.4 % (ref 0.0–5.0)
HEMATOCRIT: 44.3 % (ref 39.0–52.0)
HEMOGLOBIN: 15.1 g/dL (ref 13.0–17.0)
LYMPHS PCT: 14.3 % (ref 12.0–46.0)
Lymphs Abs: 1 10*3/uL (ref 0.7–4.0)
MCHC: 34 g/dL (ref 30.0–36.0)
MCV: 98.8 fl (ref 78.0–100.0)
MONOS PCT: 11.5 % (ref 3.0–12.0)
Monocytes Absolute: 0.8 10*3/uL (ref 0.1–1.0)
NEUTROS ABS: 4.9 10*3/uL (ref 1.4–7.7)
Neutrophils Relative %: 70.1 % (ref 43.0–77.0)
PLATELETS: 148 10*3/uL — AB (ref 150.0–400.0)
RBC: 4.48 Mil/uL (ref 4.22–5.81)
RDW: 13.1 % (ref 11.5–15.5)
WBC: 7.1 10*3/uL (ref 4.0–10.5)

## 2018-03-26 NOTE — Addendum Note (Signed)
Addended by: Wyline Beady on: 03/26/2018 01:18 PM   Modules accepted: Orders

## 2018-03-26 NOTE — Progress Notes (Signed)
Continue every 2 month phlebotomy

## 2018-05-26 ENCOUNTER — Other Ambulatory Visit (INDEPENDENT_AMBULATORY_CARE_PROVIDER_SITE_OTHER): Payer: Medicare Other

## 2018-05-26 LAB — CBC WITH DIFFERENTIAL/PLATELET
BASOS ABS: 0 10*3/uL (ref 0.0–0.1)
BASOS PCT: 0.6 % (ref 0.0–3.0)
Eosinophils Absolute: 0.2 10*3/uL (ref 0.0–0.7)
Eosinophils Relative: 3.1 % (ref 0.0–5.0)
HEMATOCRIT: 48.3 % (ref 39.0–52.0)
Hemoglobin: 16.4 g/dL (ref 13.0–17.0)
LYMPHS PCT: 14.2 % (ref 12.0–46.0)
Lymphs Abs: 1 10*3/uL (ref 0.7–4.0)
MCHC: 33.9 g/dL (ref 30.0–36.0)
MCV: 98.7 fl (ref 78.0–100.0)
MONO ABS: 0.7 10*3/uL (ref 0.1–1.0)
Monocytes Relative: 10.4 % (ref 3.0–12.0)
NEUTROS ABS: 5 10*3/uL (ref 1.4–7.7)
Neutrophils Relative %: 71.7 % (ref 43.0–77.0)
PLATELETS: 189 10*3/uL (ref 150.0–400.0)
RBC: 4.89 Mil/uL (ref 4.22–5.81)
RDW: 12.9 % (ref 11.5–15.5)
WBC: 7 10*3/uL (ref 4.0–10.5)

## 2018-05-30 NOTE — Progress Notes (Signed)
Phlebotomy again in 2 months Order a ferritin for that time also

## 2018-06-03 ENCOUNTER — Other Ambulatory Visit: Payer: Self-pay

## 2018-06-06 DIAGNOSIS — H43811 Vitreous degeneration, right eye: Secondary | ICD-10-CM | POA: Diagnosis not present

## 2018-06-06 DIAGNOSIS — H2513 Age-related nuclear cataract, bilateral: Secondary | ICD-10-CM | POA: Diagnosis not present

## 2018-06-16 DIAGNOSIS — H903 Sensorineural hearing loss, bilateral: Secondary | ICD-10-CM | POA: Diagnosis not present

## 2018-06-19 ENCOUNTER — Ambulatory Visit (INDEPENDENT_AMBULATORY_CARE_PROVIDER_SITE_OTHER): Payer: Medicare Other | Admitting: Family Medicine

## 2018-06-19 ENCOUNTER — Encounter: Payer: Self-pay | Admitting: Family Medicine

## 2018-06-19 VITALS — BP 138/63 | HR 73 | Temp 98.7°F | Resp 16 | Ht 72.0 in | Wt 190.2 lb

## 2018-06-19 DIAGNOSIS — H6502 Acute serous otitis media, left ear: Secondary | ICD-10-CM | POA: Insufficient documentation

## 2018-06-19 DIAGNOSIS — G250 Essential tremor: Secondary | ICD-10-CM | POA: Diagnosis not present

## 2018-06-19 MED ORDER — CEFUROXIME AXETIL 500 MG PO TABS: 500.0000 mg | ORAL_TABLET | Freq: Two times a day (BID) | ORAL | 0 refills | Status: DC

## 2018-06-19 MED ORDER — HYDROCORTISONE-ACETIC ACID 1-2 % OT SOLN: 3.0000 [drp] | Freq: Three times a day (TID) | OTIC | 0 refills | Status: DC

## 2018-06-19 MED ORDER — PRIMIDONE 50 MG PO TABS: 50.0000 mg | ORAL_TABLET | Freq: Every day | ORAL | 1 refills | Status: DC

## 2018-06-19 NOTE — Assessment & Plan Note (Signed)
abx per orders  if no improvement we will refer to ENT Pt agrees with plan

## 2018-06-19 NOTE — Progress Notes (Signed)
Patient ID: Devin Schmidt, male    DOB: 1941/05/25  Age: 77 y.o. MRN: [id number]    Subjective:  Subjective  HPI Devin Schmidt presents for L ear pain and that ear is leaking fluid.  Pt is working on getting hearing aids but felt this should be dealt with first.  He has had problems with this off and on for years    Review of Systems  Constitutional: Negative for chills and fever.  HENT: Positive for ear discharge, ear pain and hearing loss. Negative for congestion.   Eyes: Negative for discharge.  Respiratory: Negative for cough and shortness of breath.   Cardiovascular: Negative for chest pain, palpitations and leg swelling.  Gastrointestinal: Negative for abdominal pain, blood in stool, constipation, diarrhea, nausea and vomiting.  Genitourinary: Negative for dysuria, frequency, hematuria and urgency.  Musculoskeletal: Negative for back pain and myalgias.  Skin: Negative for rash.  Allergic/Immunologic: Negative for environmental allergies.  Neurological: Negative for dizziness, weakness and headaches.  Hematological: Does not bruise/bleed easily.  Psychiatric/Behavioral: Negative for suicidal ideas. The patient is not nervous/anxious.     History Past Medical History:  Diagnosis Date  . Allergy   . Anal fissure - posterior 10/10/2016  . Colon polyps    Tubular Adenoma and Hyperplastic   . COPD (chronic obstructive pulmonary disease) ([address])   . Epididymitis 01/03/2015  . Hemochromatosis    HX OF  . Inflamed seborrheic keratosis   . Sinusitis, acute maxillary 04/29/2015  . URI (upper respiratory infection)     He has a past surgical history that includes Colonoscopy w/ biopsies (10/20/2008); Hemorrhoid banding; Inguinal hernia repair (Right, 02/01/2015); Appendectomy (1950's); Tonsillectomy (1950's); Inguinal hernia repair (N/A, 02/01/2015); and Insertion of mesh (Right, 02/01/2015).   His family history is not on file.He reports that he quit smoking about 8 years ago. His  smoking use included cigarettes. He has a 50.00 pack-year smoking history. He has never used smokeless tobacco. He reports that he drinks about 2.0 standard drinks of alcohol per week. He reports that he does not use drugs.  No current outpatient medications on file prior to visit.   No current facility-administered medications on file prior to visit.      Objective:  Objective  Physical Exam  Constitutional: He is oriented to person, place, and time. Vital signs are normal. He appears well-developed and well-nourished. He is sleeping.  HENT:  Head: Normocephalic and atraumatic.  Right Ear: Tympanic membrane, external ear and ear canal normal. Decreased hearing is noted.  Left Ear: There is drainage. A middle ear effusion is present. Decreased hearing is noted.  Mouth/Throat: Oropharynx is clear and moist.  Eyes: Pupils are equal, round, and reactive to light. EOM are normal.  Neck: Normal range of motion. Neck supple. No thyromegaly present.  Cardiovascular: Normal rate and regular rhythm.  No murmur heard. Pulmonary/Chest: Effort normal and breath sounds normal. No respiratory distress. He has no wheezes. He has no rales. He exhibits no tenderness.  Musculoskeletal: He exhibits no edema or tenderness.  Neurological: He is alert and oriented to person, place, and time.  Skin: Skin is warm and dry.  Psychiatric: He has a normal mood and affect. His behavior is normal. Judgment and thought content normal.  Nursing note and vitals reviewed.  BP 138/63 (BP Location: Right Arm, Cuff Size: Normal)   Pulse 73   Temp 98.7 F (37.1 C) (Oral)   Resp 16   Ht 6' (1.829 m)   Wt 190 lb  3.2 oz (86.3 kg)   SpO2 99%   BMI 25.80 kg/m  Wt Readings from Last 3 Encounters:  06/19/18 190 lb 3.2 oz (86.3 kg)  03/13/18 187 lb (84.8 kg)  03/06/18 185 lb 9.6 oz (84.2 kg)     Lab Results  Component Value Date   WBC 7.0 05/26/2018   HGB 16.4 05/26/2018   HCT 48.3 05/26/2018   PLT 189.0  05/26/2018   GLUCOSE 84 12/13/2017   CHOL 184 12/13/2017   TRIG 130.0 12/13/2017   HDL 57.80 12/13/2017   LDLCALC 100 (H) 12/13/2017   ALT 12 12/13/2017   AST 14 12/13/2017   NA 139 12/13/2017   K 4.7 12/13/2017   CL 104 12/13/2017   CREATININE 1.24 12/13/2017   BUN 19 12/13/2017   CO2 31 12/13/2017   TSH 0.60 11/20/2012   PSA 1.16 12/13/2017    Ct Abdomen Pelvis Wo Contrast  Result Date: 07/02/2016 CLINICAL DATA:  History of inguinal hernia or repair. Right groin pain for 2 days. EXAM: CT ABDOMEN AND PELVIS WITHOUT CONTRAST TECHNIQUE: Multidetector CT imaging of the abdomen and pelvis was performed following the standard protocol without IV contrast. COMPARISON:  None. FINDINGS: Lower chest: Lung bases are clear. No effusions. Heart is normal size. Hepatobiliary: Small hypodensities in the liver compatible with small cysts. No additional focal hepatic abnormality. Layering small gallstones in the gallbladder. No biliary ductal dilatation. Pancreas: No focal abnormality or ductal dilatation. Spleen: Calcifications throughout the spleen compatible granulomatous disease. Normal size. Adrenals/Urinary Tract: Bilateral renal perinephric stranding, likely related to old insult. No hydronephrosis or focal abnormality. No visible stones. Adrenal glands and urinary bladder unremarkable. Stomach/Bowel: Stomach, large and small bowel grossly unremarkable. Vascular/Lymphatic: Diffuse aortic and iliac calcifications. No aneurysm. No adenopathy. Reproductive: Markedly enlarged prostate, measuring 7.2 x 7.0 cm. Other: No free fluid or free air. Postoperative changes from right inguinal hernia repair. No recurrent or residual hernia on the right. There is a small to moderate-sized left inguinal hernia containing fat. Musculoskeletal: No acute bony abnormality. Degenerative changes in the lumbar spine. IMPRESSION: Cholelithiasis. 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 Prior right inguinal hernia repair without  recurrent hernia. Small to moderate left inguinal hernia containing fat. Aortoiliac atherosclerosis. Electronically Signed   By: Rolm Baptise M.D.   On: 07/02/2016 11:12     Assessment & Plan:  Plan  I am having Vanessa Ralphs. Pope start on cefUROXime and acetic acid-hydrocortisone. I am also having him maintain his primidone.  Meds ordered this encounter  Medications  . primidone (MYSOLINE) 50 MG tablet    Sig: Take 1 tablet (50 mg total) by mouth at bedtime.    Dispense:  90 tablet    Refill:  1    Please put on hold until patient calls to refill.  Thanks  . cefUROXime (CEFTIN) 500 MG tablet    Sig: Take 1 tablet (500 mg total) by mouth 2 (two) times daily with a meal.    Dispense:  20 tablet    Refill:  0  . acetic acid-hydrocortisone (VOSOL-HC) OTIC solution    Sig: Place 3 drops into the left ear 3 (three) times daily.    Dispense:  10 mL    Refill:  0    Problem List Items Addressed This Visit      Unprioritized   Acute serous otitis media of left ear - Primary    abx per orders  if no improvement we will refer to ENT Pt agrees with plan  Relevant Medications   cefUROXime (CEFTIN) 500 MG tablet   acetic acid-hydrocortisone (VOSOL-HC) OTIC solution   Essential tremor   Relevant Medications   primidone (MYSOLINE) 50 MG tablet      Follow-up: Return if symptoms worsen or fail to improve.  Ann Held, DO

## 2018-06-19 NOTE — Patient Instructions (Signed)

## 2018-06-20 ENCOUNTER — Telehealth: Payer: Self-pay | Admitting: *Deleted

## 2018-06-20 NOTE — Telephone Encounter (Signed)
[facility] fax over stating that the ear drop Vosol that was sent in yesterday is unavailable. Can you change to something else.

## 2018-06-20 NOTE — Telephone Encounter (Signed)
floxin otic  10 gtts qd for 7 days

## 2018-06-24 MED ORDER — OFLOXACIN 0.3 % OT SOLN: 10.0000 [drp] | Freq: Every day | OTIC | 0 refills | Status: AC

## 2018-06-24 NOTE — Telephone Encounter (Signed)
New rx sent in

## 2018-07-07 ENCOUNTER — Telehealth: Payer: Self-pay | Admitting: Family Medicine

## 2018-07-07 ENCOUNTER — Other Ambulatory Visit: Payer: Self-pay | Admitting: Family Medicine

## 2018-07-07 DIAGNOSIS — H9209 Otalgia, unspecified ear: Secondary | ICD-10-CM

## 2018-07-07 NOTE — Telephone Encounter (Signed)
Pt came in stating that Dr. Etter Sjogren said that if he still felt like he needed to see a ENT that he should let her know. Pt stated that he would like a referral and a call when it is available.

## 2018-07-07 NOTE — Telephone Encounter (Signed)
Referral in

## 2018-07-08 NOTE — Telephone Encounter (Signed)
Patient notified

## 2018-07-23 ENCOUNTER — Other Ambulatory Visit (INDEPENDENT_AMBULATORY_CARE_PROVIDER_SITE_OTHER): Payer: Medicare Other

## 2018-07-23 LAB — CBC WITH DIFFERENTIAL/PLATELET
BASOS ABS: 0 10*3/uL (ref 0.0–0.1)
Basophils Relative: 0.5 % (ref 0.0–3.0)
Eosinophils Absolute: 0.2 10*3/uL (ref 0.0–0.7)
Eosinophils Relative: 3.5 % (ref 0.0–5.0)
HEMATOCRIT: 44.5 % (ref 39.0–52.0)
HEMOGLOBIN: 15.2 g/dL (ref 13.0–17.0)
LYMPHS PCT: 15.1 % (ref 12.0–46.0)
Lymphs Abs: 1 10*3/uL (ref 0.7–4.0)
MCHC: 34.3 g/dL (ref 30.0–36.0)
MCV: 96.7 fl (ref 78.0–100.0)
MONO ABS: 0.8 10*3/uL (ref 0.1–1.0)
Monocytes Relative: 12 % (ref 3.0–12.0)
Neutro Abs: 4.6 10*3/uL (ref 1.4–7.7)
Neutrophils Relative %: 68.9 % (ref 43.0–77.0)
PLATELETS: 164 10*3/uL (ref 150.0–400.0)
RBC: 4.6 Mil/uL (ref 4.22–5.81)
RDW: 12.8 % (ref 11.5–15.5)
WBC: 6.7 10*3/uL (ref 4.0–10.5)

## 2018-07-30 NOTE — Progress Notes (Signed)
Phlebotomy in 2 months Ferritin level is needed then also please - they did not do it this time as I asked before

## 2018-07-31 ENCOUNTER — Other Ambulatory Visit: Payer: Self-pay

## 2018-08-21 DIAGNOSIS — H60393 Other infective otitis externa, bilateral: Secondary | ICD-10-CM | POA: Diagnosis not present

## 2018-08-26 DIAGNOSIS — H60393 Other infective otitis externa, bilateral: Secondary | ICD-10-CM | POA: Diagnosis not present

## 2018-09-22 ENCOUNTER — Other Ambulatory Visit (INDEPENDENT_AMBULATORY_CARE_PROVIDER_SITE_OTHER): Payer: Medicare Other

## 2018-09-22 LAB — CBC WITH DIFFERENTIAL/PLATELET
BASOS PCT: 0.5 % (ref 0.0–3.0)
Basophils Absolute: 0 10*3/uL (ref 0.0–0.1)
EOS ABS: 0.2 10*3/uL (ref 0.0–0.7)
EOS PCT: 4.3 % (ref 0.0–5.0)
HCT: 47 % (ref 39.0–52.0)
Hemoglobin: 16 g/dL (ref 13.0–17.0)
Lymphocytes Relative: 15.2 % (ref 12.0–46.0)
Lymphs Abs: 0.9 10*3/uL (ref 0.7–4.0)
MCHC: 34 g/dL (ref 30.0–36.0)
MCV: 98.3 fl (ref 78.0–100.0)
MONO ABS: 0.7 10*3/uL (ref 0.1–1.0)
Monocytes Relative: 12.3 % — ABNORMAL HIGH (ref 3.0–12.0)
NEUTROS ABS: 3.8 10*3/uL (ref 1.4–7.7)
Neutrophils Relative %: 67.7 % (ref 43.0–77.0)
PLATELETS: 166 10*3/uL (ref 150.0–400.0)
RBC: 4.78 Mil/uL (ref 4.22–5.81)
RDW: 13 % (ref 11.5–15.5)
WBC: 5.6 10*3/uL (ref 4.0–10.5)

## 2018-09-22 LAB — FERRITIN: Ferritin: 22.7 ng/mL (ref 22.0–322.0)

## 2018-09-25 NOTE — Progress Notes (Signed)
Repeat phlebotomy in 2 months please

## 2018-10-21 DIAGNOSIS — H608X3 Other otitis externa, bilateral: Secondary | ICD-10-CM | POA: Diagnosis not present

## 2018-11-25 ENCOUNTER — Other Ambulatory Visit (INDEPENDENT_AMBULATORY_CARE_PROVIDER_SITE_OTHER): Payer: Medicare Other

## 2018-11-25 ENCOUNTER — Other Ambulatory Visit: Payer: Self-pay

## 2018-11-25 LAB — CBC WITH DIFFERENTIAL/PLATELET
BASOS ABS: 0 10*3/uL (ref 0.0–0.1)
Basophils Relative: 0.7 % (ref 0.0–3.0)
EOS ABS: 0.2 10*3/uL (ref 0.0–0.7)
Eosinophils Relative: 3.1 % (ref 0.0–5.0)
HEMATOCRIT: 46.3 % (ref 39.0–52.0)
HEMOGLOBIN: 15.8 g/dL (ref 13.0–17.0)
LYMPHS PCT: 12.4 % (ref 12.0–46.0)
Lymphs Abs: 0.9 10*3/uL (ref 0.7–4.0)
MCHC: 34.2 g/dL (ref 30.0–36.0)
MCV: 97.4 fl (ref 78.0–100.0)
Monocytes Absolute: 0.8 10*3/uL (ref 0.1–1.0)
Monocytes Relative: 11.1 % (ref 3.0–12.0)
NEUTROS ABS: 5.1 10*3/uL (ref 1.4–7.7)
Neutrophils Relative %: 72.7 % (ref 43.0–77.0)
PLATELETS: 161 10*3/uL (ref 150.0–400.0)
RBC: 4.75 Mil/uL (ref 4.22–5.81)
RDW: 13.2 % (ref 11.5–15.5)
WBC: 7 10*3/uL (ref 4.0–10.5)

## 2018-11-26 NOTE — Progress Notes (Signed)
Repeat phlebotomy in 2 months please

## 2018-12-22 ENCOUNTER — Telehealth: Payer: Self-pay | Admitting: Family Medicine

## 2018-12-22 DIAGNOSIS — G250 Essential tremor: Secondary | ICD-10-CM

## 2018-12-22 NOTE — Telephone Encounter (Signed)
Pt came in office stating is needing a refill for primidone (MYSOLINE) 50 MG tablet. Pt would like to have rx sent to [facility]. Pt tel [phone]. Please advise.

## 2018-12-23 MED ORDER — PRIMIDONE 50 MG PO TABS: 50.0000 mg | ORAL_TABLET | Freq: Every day | ORAL | 1 refills | Status: DC

## 2018-12-23 NOTE — Telephone Encounter (Signed)
Patient notified rx has been sent in. 

## 2019-01-22 ENCOUNTER — Other Ambulatory Visit: Payer: Self-pay | Admitting: Internal Medicine

## 2019-01-22 ENCOUNTER — Other Ambulatory Visit (INDEPENDENT_AMBULATORY_CARE_PROVIDER_SITE_OTHER): Payer: Medicare Other

## 2019-01-22 LAB — CBC WITH DIFFERENTIAL/PLATELET
Basophils Absolute: 0 10*3/uL (ref 0.0–0.1)
Basophils Relative: 0.6 % (ref 0.0–3.0)
Eosinophils Absolute: 0.2 10*3/uL (ref 0.0–0.7)
Eosinophils Relative: 2.4 % (ref 0.0–5.0)
HCT: 46.3 % (ref 39.0–52.0)
Hemoglobin: 15.6 g/dL (ref 13.0–17.0)
Lymphocytes Relative: 12.4 % (ref 12.0–46.0)
Lymphs Abs: 0.8 10*3/uL (ref 0.7–4.0)
MCHC: 33.7 g/dL (ref 30.0–36.0)
MCV: 97.6 fl (ref 78.0–100.0)
Monocytes Absolute: 0.6 10*3/uL (ref 0.1–1.0)
Monocytes Relative: 9.5 % (ref 3.0–12.0)
Neutro Abs: 5 10*3/uL (ref 1.4–7.7)
Neutrophils Relative %: 75.1 % (ref 43.0–77.0)
Platelets: 148 10*3/uL — ABNORMAL LOW (ref 150.0–400.0)
RBC: 4.74 Mil/uL (ref 4.22–5.81)
RDW: 13.1 % (ref 11.5–15.5)
WBC: 6.7 10*3/uL (ref 4.0–10.5)

## 2019-01-22 NOTE — Progress Notes (Signed)
Repeat phlebotomy in 2 months Do a ferritin then also please

## 2019-01-23 ENCOUNTER — Encounter: Payer: Medicare Other | Admitting: Family Medicine

## 2019-01-26 ENCOUNTER — Other Ambulatory Visit: Payer: Self-pay

## 2019-01-26 NOTE — Progress Notes (Signed)
Ferritin

## 2019-03-25 ENCOUNTER — Other Ambulatory Visit (INDEPENDENT_AMBULATORY_CARE_PROVIDER_SITE_OTHER): Payer: Medicare Other

## 2019-03-25 LAB — CBC WITH DIFFERENTIAL/PLATELET
Basophils Absolute: 0 10*3/uL (ref 0.0–0.1)
Basophils Relative: 0.4 % (ref 0.0–3.0)
Eosinophils Absolute: 0.2 10*3/uL (ref 0.0–0.7)
Eosinophils Relative: 3.3 % (ref 0.0–5.0)
HCT: 47.1 % (ref 39.0–52.0)
Hemoglobin: 15.9 g/dL (ref 13.0–17.0)
Lymphocytes Relative: 17 % (ref 12.0–46.0)
Lymphs Abs: 1 10*3/uL (ref 0.7–4.0)
MCHC: 33.8 g/dL (ref 30.0–36.0)
MCV: 98 fl (ref 78.0–100.0)
Monocytes Absolute: 0.7 10*3/uL (ref 0.1–1.0)
Monocytes Relative: 11.8 % (ref 3.0–12.0)
Neutro Abs: 4 10*3/uL (ref 1.4–7.7)
Neutrophils Relative %: 67.5 % (ref 43.0–77.0)
Platelets: 174 10*3/uL (ref 150.0–400.0)
RBC: 4.81 Mil/uL (ref 4.22–5.81)
RDW: 13.3 % (ref 11.5–15.5)
WBC: 5.9 10*3/uL (ref 4.0–10.5)

## 2019-03-25 LAB — FERRITIN: Ferritin: 31.8 ng/mL (ref 22.0–322.0)

## 2019-03-26 NOTE — Progress Notes (Signed)
Repeat phlebotomy 2 months

## 2019-03-30 ENCOUNTER — Encounter: Payer: Medicare Other | Admitting: Family Medicine

## 2019-05-27 ENCOUNTER — Other Ambulatory Visit (INDEPENDENT_AMBULATORY_CARE_PROVIDER_SITE_OTHER): Payer: Medicare Other

## 2019-05-27 LAB — CBC WITH DIFFERENTIAL/PLATELET
Basophils Absolute: 0 10*3/uL (ref 0.0–0.1)
Basophils Relative: 0.3 % (ref 0.0–3.0)
Eosinophils Absolute: 0.3 10*3/uL (ref 0.0–0.7)
Eosinophils Relative: 4.2 % (ref 0.0–5.0)
HCT: 45.8 % (ref 39.0–52.0)
Hemoglobin: 15.5 g/dL (ref 13.0–17.0)
Lymphocytes Relative: 14.3 % (ref 12.0–46.0)
Lymphs Abs: 0.9 10*3/uL (ref 0.7–4.0)
MCHC: 33.9 g/dL (ref 30.0–36.0)
MCV: 98.3 fl (ref 78.0–100.0)
Monocytes Absolute: 0.7 10*3/uL (ref 0.1–1.0)
Monocytes Relative: 9.9 % (ref 3.0–12.0)
Neutro Abs: 4.7 10*3/uL (ref 1.4–7.7)
Neutrophils Relative %: 71.3 % (ref 43.0–77.0)
Platelets: 175 10*3/uL (ref 150.0–400.0)
RBC: 4.66 Mil/uL (ref 4.22–5.81)
RDW: 13.4 % (ref 11.5–15.5)
WBC: 6.6 10*3/uL (ref 4.0–10.5)

## 2019-05-27 NOTE — Progress Notes (Signed)
Repeat phlebotomy in 2 months please

## 2019-06-22 ENCOUNTER — Other Ambulatory Visit: Payer: Self-pay | Admitting: Family Medicine

## 2019-06-22 DIAGNOSIS — G250 Essential tremor: Secondary | ICD-10-CM

## 2019-06-22 MED ORDER — PRIMIDONE 50 MG PO TABS: 50.0000 mg | ORAL_TABLET | Freq: Every day | ORAL | 1 refills | Status: DC

## 2019-06-22 NOTE — Telephone Encounter (Signed)
Copied from [place] [phone]. Topic: Quick Communication - Rx Refill/Question >> Jun 22, 2019  1:45 PM Mcneil, Ja-Kwan wrote: Medication: primidone (MYSOLINE) 50 MG tablet  Has the patient contacted their pharmacy? yes   Preferred Pharmacy (with phone number or street name): [place], [place]. [phone] (Phone) [phone] (Fax)  Agent: Please be advised that RX refills may take up to 3 business days. We ask that you follow-up with your pharmacy.

## 2019-06-22 NOTE — Telephone Encounter (Signed)
Requested medication (s) are due for refill today:yes  Requested medication (s) are on the active medication list:yes  Last refill:  12/22/2018 Future visit scheduled: no  Notes to clinic:  This refill cannot be delegated   Requested Prescriptions  Pending Prescriptions Disp Refills   primidone (MYSOLINE) 50 MG tablet 90 tablet 1    Sig: Take 1 tablet (50 mg total) by mouth at bedtime.     Not Delegated - Neurology:  Anticonvulsants Failed - 06/22/2019  1:48 PM      Failed - This refill cannot be delegated      Failed - Valid encounter within last 12 months    Recent Outpatient Visits          1 year ago Acute serous otitis media of left ear, recurrence not specified   [facility], [place]   1 year ago Eczema, unspecified type   [facility] [place], DO   2 years ago Preventative health care   [facility] [place], DO   2 years ago History of inguinal hernia repair   [facility], [place], [place]   3 years ago Hemorrhoids, unspecified hemorrhoid type   [facility] [place], [place]             Passed - HCT in normal range and within 360 days    HCT  Date Value Ref Range Status  05/27/2019 45.8 39.0 - 52.0 % Final         Passed - HGB in normal range and within 360 days    Hemoglobin  Date Value Ref Range Status  05/27/2019 15.5 13.0 - 17.0 g/dL Final         Passed - PLT in normal range and within 360 days    Platelets  Date Value Ref Range Status  05/27/2019 175.0 150.0 - 400.0 K/uL Final         Passed - WBC in normal range and within 360 days    WBC  Date Value Ref Range Status  05/27/2019 6.6 4.0 - 10.5 K/uL Final

## 2019-06-26 ENCOUNTER — Telehealth: Payer: Self-pay | Admitting: Family Medicine

## 2019-06-26 DIAGNOSIS — G250 Essential tremor: Secondary | ICD-10-CM

## 2019-06-26 MED ORDER — PRIMIDONE 50 MG PO TABS: 50.0000 mg | ORAL_TABLET | Freq: Every day | ORAL | 1 refills | Status: DC

## 2019-06-26 NOTE — Telephone Encounter (Signed)
Pt stated the pharmacy did not have rx for primidone (MYSOLINE) 50 MG tablet  . Requesting it to be resent.  [place], [place]. [phone] (Phone) [phone] (Fax)

## 2019-06-26 NOTE — Telephone Encounter (Signed)
Medication has been resent

## 2019-07-24 ENCOUNTER — Other Ambulatory Visit (INDEPENDENT_AMBULATORY_CARE_PROVIDER_SITE_OTHER): Payer: Medicare Other

## 2019-07-24 LAB — CBC WITH DIFFERENTIAL/PLATELET
Basophils Absolute: 0 10*3/uL (ref 0.0–0.1)
Basophils Relative: 0.5 % (ref 0.0–3.0)
Eosinophils Absolute: 0.3 10*3/uL (ref 0.0–0.7)
Eosinophils Relative: 4.2 % (ref 0.0–5.0)
HCT: 45.1 % (ref 39.0–52.0)
Hemoglobin: 15.3 g/dL (ref 13.0–17.0)
Lymphocytes Relative: 12.3 % (ref 12.0–46.0)
Lymphs Abs: 0.8 10*3/uL (ref 0.7–4.0)
MCHC: 33.9 g/dL (ref 30.0–36.0)
MCV: 99.3 fl (ref 78.0–100.0)
Monocytes Absolute: 0.8 10*3/uL (ref 0.1–1.0)
Monocytes Relative: 11.5 % (ref 3.0–12.0)
Neutro Abs: 4.9 10*3/uL (ref 1.4–7.7)
Neutrophils Relative %: 71.5 % (ref 43.0–77.0)
Platelets: 169 10*3/uL (ref 150.0–400.0)
RBC: 4.54 Mil/uL (ref 4.22–5.81)
RDW: 13.3 % (ref 11.5–15.5)
WBC: 6.8 10*3/uL (ref 4.0–10.5)

## 2019-07-30 ENCOUNTER — Other Ambulatory Visit: Payer: Self-pay

## 2019-07-30 NOTE — Progress Notes (Signed)
Please set up repeat phlebotomy in 2 months and do a FERRITIN level that day also

## 2019-09-14 ENCOUNTER — Ambulatory Visit (INDEPENDENT_AMBULATORY_CARE_PROVIDER_SITE_OTHER): Payer: Medicare Other | Admitting: Family Medicine

## 2019-09-14 ENCOUNTER — Other Ambulatory Visit: Payer: Self-pay

## 2019-09-14 ENCOUNTER — Encounter: Payer: Self-pay | Admitting: Family Medicine

## 2019-09-14 ENCOUNTER — Encounter: Payer: Medicare Other | Admitting: Family Medicine

## 2019-09-14 VITALS — BP 110/80 | HR 62 | Temp 97.6°F | Resp 18 | Ht 72.0 in | Wt 196.8 lb

## 2019-09-14 DIAGNOSIS — Z136 Encounter for screening for cardiovascular disorders: Secondary | ICD-10-CM | POA: Diagnosis not present

## 2019-09-14 DIAGNOSIS — R351 Nocturia: Secondary | ICD-10-CM | POA: Diagnosis not present

## 2019-09-14 DIAGNOSIS — Z0000 Encounter for general adult medical examination without abnormal findings: Secondary | ICD-10-CM | POA: Diagnosis not present

## 2019-09-14 LAB — COMPREHENSIVE METABOLIC PANEL
ALT: 14 U/L (ref 0–53)
AST: 15 U/L (ref 0–37)
Albumin: 3.7 g/dL (ref 3.5–5.2)
Alkaline Phosphatase: 69 U/L (ref 39–117)
BUN: 16 mg/dL (ref 6–23)
CO2: 29 mEq/L (ref 19–32)
Calcium: 8.9 mg/dL (ref 8.4–10.5)
Chloride: 102 mEq/L (ref 96–112)
Creatinine, Ser: 1.24 mg/dL (ref 0.40–1.50)
GFR: 55.98 mL/min — ABNORMAL LOW (ref >60.00)
Glucose, Bld: 91 mg/dL (ref 70–99)
Potassium: 4.3 mEq/L (ref 3.5–5.1)
Sodium: 140 mEq/L (ref 135–145)
Total Bilirubin: 0.6 mg/dL (ref 0.2–1.2)
Total Protein: 6.3 g/dL (ref 6.0–8.3)

## 2019-09-14 LAB — PSA: PSA: 1.22 ng/mL (ref 0.10–4.00)

## 2019-09-14 LAB — LDL CHOLESTEROL, DIRECT: Direct LDL: 72 mg/dL

## 2019-09-14 LAB — LIPID PANEL
Cholesterol: 165 mg/dL (ref 0–200)
HDL: 59.8 mg/dL (ref >39.00)
NonHDL: 105
Total CHOL/HDL Ratio: 3
Triglycerides: 206 mg/dL — ABNORMAL HIGH (ref 0.0–149.0)
VLDL: 41.2 mg/dL — ABNORMAL HIGH (ref 0.0–40.0)

## 2019-09-14 NOTE — Assessment & Plan Note (Signed)
Prostate slightly enlarged Gets up 1x a night  Check labs

## 2019-09-14 NOTE — Assessment & Plan Note (Signed)
See avs ghm utd--- pt refused pneumonia vac He will get shingrix at pharmacy Check labs

## 2019-09-14 NOTE — Assessment & Plan Note (Signed)
Check labs 

## 2019-09-14 NOTE — Progress Notes (Signed)
Patient ID: Devin Schmidt, male    DOB: November 14, 1940  Age: 78 y.o. MRN: [id number]    Subjective:  Subjective  HPI Devin Schmidt presents for cpe    No complaints except he does get up to urinate at night  Review of Systems  Constitutional: Negative.  Negative for appetite change, diaphoresis, fatigue and unexpected weight change.  HENT: Negative for congestion, ear pain, hearing loss, nosebleeds, postnasal drip, rhinorrhea, sinus pressure, sneezing and tinnitus.   Eyes: Negative for photophobia, pain, discharge, redness, itching and visual disturbance.  Respiratory: Negative.  Negative for cough, chest tightness, shortness of breath and wheezing.   Cardiovascular: Negative.  Negative for chest pain, palpitations and leg swelling.  Gastrointestinal: Negative for abdominal distention, abdominal pain, anal bleeding, blood in stool and constipation.  Endocrine: Negative.  Negative for cold intolerance, heat intolerance, polydipsia, polyphagia and polyuria.  Genitourinary: Positive for frequency. Negative for difficulty urinating and dysuria.  Musculoskeletal: Negative.   Skin: Negative.   Allergic/Immunologic: Negative.   Neurological: Negative for dizziness, weakness, light-headedness, numbness and headaches.  Psychiatric/Behavioral: Negative for agitation, confusion, decreased concentration, dysphoric mood, sleep disturbance and suicidal ideas. The patient is not nervous/anxious.     History Past Medical History:  Diagnosis Date  . Allergy   . Anal fissure - posterior 10/10/2016  . Colon polyps    Tubular Adenoma and Hyperplastic   . COPD (chronic obstructive pulmonary disease) ([place])   . Epididymitis 01/03/2015  . Hemochromatosis    HX OF  . Inflamed seborrheic keratosis   . Sinusitis, acute maxillary 04/29/2015  . URI (upper respiratory infection)     He has a past surgical history that includes Colonoscopy w/ biopsies (10/20/2008); Hemorrhoid banding; Inguinal hernia repair  (Right, 02/01/2015); Appendectomy (1950's); Tonsillectomy (1950's); Inguinal hernia repair (N/A, 02/01/2015); and Insertion of mesh (Right, 02/01/2015).   His family history is not on file.He reports that he quit smoking about 10 years ago. His smoking use included cigarettes. He has a 50.00 pack-year smoking history. He has never used smokeless tobacco. He reports current alcohol use of about 2.0 standard drinks of alcohol per week. He reports that he does not use drugs.  Current Outpatient Medications on File Prior to Visit  Medication Sig Dispense Refill  . primidone (MYSOLINE) 50 MG tablet Take 1 tablet (50 mg total) by mouth at bedtime. 90 tablet 1   No current facility-administered medications on file prior to visit.      Objective:  Objective  Physical Exam Vitals signs and nursing note reviewed. Exam conducted with a chaperone present.  Constitutional:      General: He is not in acute distress.    Appearance: He is well-developed. He is not diaphoretic.  HENT:     Head: Normocephalic and atraumatic.     Right Ear: External ear normal.     Left Ear: External ear normal.     Nose: Nose normal.     Mouth/Throat:     Pharynx: No oropharyngeal exudate.  Eyes:     General:        Right eye: No discharge.        Left eye: No discharge.     Conjunctiva/sclera: Conjunctivae normal.     Pupils: Pupils are equal, round, and reactive to light.  Neck:     Musculoskeletal: Normal range of motion and neck supple.     Thyroid: No thyromegaly.     Vascular: No JVD.  Cardiovascular:     Rate and Rhythm:  Normal rate and regular rhythm.     Heart sounds: No murmur. No friction rub. No gallop.   Pulmonary:     Effort: Pulmonary effort is normal. No respiratory distress.     Breath sounds: Normal breath sounds. No wheezing or rales.  Chest:     Chest wall: No tenderness.  Abdominal:     General: Bowel sounds are normal. There is no distension.     Palpations: Abdomen is soft. There is no  mass.     Tenderness: There is no abdominal tenderness. There is no guarding or rebound.  Genitourinary:    Penis: Normal.      Prostate: Enlarged. Not tender and no nodules present.     Rectum: Guaiac result negative. No mass, tenderness or external hemorrhoid. Normal anal tone.  Musculoskeletal: Normal range of motion.        General: No tenderness.  Lymphadenopathy:     Cervical: No cervical adenopathy.  Skin:    General: Skin is warm and dry.     Coloration: Skin is not pale.     Findings: No erythema or rash.  Neurological:     Mental Status: He is alert and oriented to person, place, and time.     Motor: No abnormal muscle tone.     Deep Tendon Reflexes: Reflexes are normal and symmetric. Reflexes normal.  Psychiatric:        Behavior: Behavior normal.        Thought Content: Thought content normal.        Judgment: Judgment normal.    BP 110/80 (BP Location: Right Arm, Patient Position: Sitting, Cuff Size: Normal)   Pulse 62   Temp 97.6 F (36.4 C) (Temporal)   Resp 18   Ht 6' (1.829 m)   Wt 196 lb 12.8 oz (89.3 kg)   SpO2 97%   BMI 26.69 kg/m  Wt Readings from Last 3 Encounters:  09/14/19 196 lb 12.8 oz (89.3 kg)  06/19/18 190 lb 3.2 oz (86.3 kg)  03/13/18 187 lb (84.8 kg)     Lab Results  Component Value Date   WBC 6.8 07/24/2019   HGB 15.3 07/24/2019   HCT 45.1 07/24/2019   PLT 169.0 07/24/2019   GLUCOSE 84 12/13/2017   CHOL 184 12/13/2017   TRIG 130.0 12/13/2017   HDL 57.80 12/13/2017   LDLCALC 100 (H) 12/13/2017   ALT 12 12/13/2017   AST 14 12/13/2017   NA 139 12/13/2017   K 4.7 12/13/2017   CL 104 12/13/2017   CREATININE 1.24 12/13/2017   BUN 19 12/13/2017   CO2 31 12/13/2017   TSH 0.60 11/20/2012   PSA 1.16 12/13/2017    Ct Abdomen Pelvis Wo Contrast  Result Date: 07/02/2016 CLINICAL DATA:  History of inguinal hernia or repair. Right groin pain for 2 days. EXAM: CT ABDOMEN AND PELVIS WITHOUT CONTRAST TECHNIQUE: Multidetector CT imaging  of the abdomen and pelvis was performed following the standard protocol without IV contrast. COMPARISON:  None. FINDINGS: Lower chest: Lung bases are clear. No effusions. Heart is normal size. Hepatobiliary: Small hypodensities in the liver compatible with small cysts. No additional focal hepatic abnormality. Layering small gallstones in the gallbladder. No biliary ductal dilatation. Pancreas: No focal abnormality or ductal dilatation. Spleen: Calcifications throughout the spleen compatible granulomatous disease. Normal size. Adrenals/Urinary Tract: Bilateral renal perinephric stranding, likely related to old insult. No hydronephrosis or focal abnormality. No visible stones. Adrenal glands and urinary bladder unremarkable. Stomach/Bowel: Stomach, large and small bowel grossly  unremarkable. Vascular/Lymphatic: Diffuse aortic and iliac calcifications. No aneurysm. No adenopathy. Reproductive: Markedly enlarged prostate, measuring 7.2 x 7.0 cm. Other: No free fluid or free air. Postoperative changes from right inguinal hernia repair. No recurrent or residual hernia on the right. There is a small to moderate-sized left inguinal hernia containing fat. Musculoskeletal: No acute bony abnormality. Degenerative changes in the lumbar spine. IMPRESSION: Cholelithiasis. 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 Prior right inguinal hernia repair without recurrent hernia. Small to moderate left inguinal hernia containing fat. Aortoiliac atherosclerosis. Electronically Signed   By: Rolm Baptise M.D.   On: 07/02/2016 11:12     Assessment & Plan:  Plan  I have discontinued [place]. [place]'s cefUROXime. I am also having him maintain his primidone.  No orders of the defined types were placed in this encounter.   Problem List Items Addressed This Visit      Unprioritized   Ischemic heart disease screen    Check labs      Relevant Orders   Lipid panel   Comprehensive metabolic panel   Nocturia    Prostate slightly  enlarged Gets up 1x a night  Check labs      Relevant Orders   PSA   Preventative health care - Primary    See avs ghm utd--- pt refused pneumonia vac He will get shingrix at pharmacy Check labs         Follow-up: Return in about 1 year (around 09/13/2020), or if symptoms worsen or fail to improve, for annual exam, fasting.  Ann Held, DO

## 2019-09-14 NOTE — Patient Instructions (Signed)
Preventive Care 75 Years and Older, Male Preventive care refers to lifestyle choices and visits with your health care provider that can promote health and wellness. This includes:  A yearly physical exam. This is also called an annual well check.  Regular dental and eye exams.  Immunizations.  Screening for certain conditions.  Healthy lifestyle choices, such as diet and exercise. What can I expect for my preventive care visit? Physical exam Your health care provider will check:  Height and weight. These may be used to calculate body mass index (BMI), which is a measurement that tells if you are at a healthy weight.  Heart rate and blood pressure.  Your skin for abnormal spots. Counseling Your health care provider may ask you questions about:  Alcohol, tobacco, and drug use.  Emotional well-being.  Home and relationship well-being.  Sexual activity.  Eating habits.  History of falls.  Memory and ability to understand (cognition).  Work and work Statistician. What immunizations do I need?  Influenza (flu) vaccine  This is recommended every year. Tetanus, diphtheria, and pertussis (Tdap) vaccine  You may need a Td booster every 10 years. Varicella (chickenpox) vaccine  You may need this vaccine if you have not already been vaccinated. Zoster (shingles) vaccine  You may need this after age 50. Pneumococcal conjugate (PCV13) vaccine  One dose is recommended after age 24. Pneumococcal polysaccharide (PPSV23) vaccine  One dose is recommended after age 33. Measles, mumps, and rubella (MMR) vaccine  You may need at least one dose of MMR if you were born in 1957 or later. You may also need a second dose. Meningococcal conjugate (MenACWY) vaccine  You may need this if you have certain conditions. Hepatitis A vaccine  You may need this if you have certain conditions or if you travel or work in places where you may be exposed to hepatitis A. Hepatitis B vaccine   You may need this if you have certain conditions or if you travel or work in places where you may be exposed to hepatitis B. Haemophilus influenzae type b (Hib) vaccine  You may need this if you have certain conditions. You may receive vaccines as individual doses or as more than one vaccine together in one shot (combination vaccines). Talk with your health care provider about the risks and benefits of combination vaccines. What tests do I need? Blood tests  Lipid and cholesterol levels. These may be checked every 5 years, or more frequently depending on your overall health.  Hepatitis C test.  Hepatitis B test. Screening  Lung cancer screening. You may have this screening every year starting at age 74 if you have a 30-pack-year history of smoking and currently smoke or have quit within the past 15 years.  Colorectal cancer screening. All adults should have this screening starting at age 57 and continuing until age 54. Your health care provider may recommend screening at age 47 if you are at increased risk. You will have tests every 1-10 years, depending on your results and the type of screening test.  Prostate cancer screening. Recommendations will vary depending on your family history and other risks.  Diabetes screening. This is done by checking your blood sugar (glucose) after you have not eaten for a while (fasting). You may have this done every 1-3 years.  Abdominal aortic aneurysm (AAA) screening. You may need this if you are a current or former smoker.  Sexually transmitted disease (STD) testing. Follow these instructions at home: Eating and drinking  Eat  a diet that includes fresh fruits and vegetables, whole grains, lean protein, and low-fat dairy products. Limit your intake of foods with high amounts of sugar, saturated fats, and salt.  Take vitamin and mineral supplements as recommended by your health care provider.  Do not drink alcohol if your health care provider  tells you not to drink.  If you drink alcohol: ? Limit how much you have to 0-2 drinks a day. ? Be aware of how much alcohol is in your drink. In the [place] one drink equals one 12 oz bottle of beer (355 mL), one 5 oz glass of wine (148 mL), or one 1 oz glass of hard liquor (44 mL). Lifestyle  Take daily care of your teeth and gums.  Stay active. Exercise for at least 30 minutes on 5 or more days each week.  Do not use any products that contain nicotine or tobacco, such as cigarettes, e-cigarettes, and chewing tobacco. If you need help quitting, ask your health care provider.  If you are sexually active, practice safe sex. Use a condom or other form of protection to prevent STIs (sexually transmitted infections).  Talk with your health care provider about taking a low-dose aspirin or statin. What's next?  Visit your health care provider once a year for a well check visit.  Ask your health care provider how often you should have your eyes and teeth checked.  Stay up to date on all vaccines. This information is not intended to replace advice given to you by your health care provider. Make sure you discuss any questions you have with your health care provider. Document Released: 11/04/2015 Document Revised: 10/02/2018 Document Reviewed: 10/02/2018 Elsevier Patient Education  2020 Elsevier Inc.  

## 2019-09-24 ENCOUNTER — Other Ambulatory Visit (INDEPENDENT_AMBULATORY_CARE_PROVIDER_SITE_OTHER): Payer: Medicare Other

## 2019-09-24 LAB — CBC WITH DIFFERENTIAL/PLATELET
Basophils Absolute: 0.1 10*3/uL (ref 0.0–0.1)
Basophils Relative: 0.7 % (ref 0.0–3.0)
Eosinophils Absolute: 0.4 10*3/uL (ref 0.0–0.7)
Eosinophils Relative: 5.2 % — ABNORMAL HIGH (ref 0.0–5.0)
HCT: 48.1 % (ref 39.0–52.0)
Hemoglobin: 16.1 g/dL (ref 13.0–17.0)
Lymphocytes Relative: 12.7 % (ref 12.0–46.0)
Lymphs Abs: 0.9 10*3/uL (ref 0.7–4.0)
MCHC: 33.5 g/dL (ref 30.0–36.0)
MCV: 99.9 fl (ref 78.0–100.0)
Monocytes Absolute: 0.8 10*3/uL (ref 0.1–1.0)
Monocytes Relative: 12 % (ref 3.0–12.0)
Neutro Abs: 4.9 10*3/uL (ref 1.4–7.7)
Neutrophils Relative %: 69.4 % (ref 43.0–77.0)
Platelets: 150 10*3/uL (ref 150.0–400.0)
RBC: 4.82 Mil/uL (ref 4.22–5.81)
RDW: 12.9 % (ref 11.5–15.5)
WBC: 7.1 10*3/uL (ref 4.0–10.5)

## 2019-09-24 LAB — FERRITIN: Ferritin: 36.3 ng/mL (ref 22.0–322.0)

## 2019-09-26 NOTE — Progress Notes (Signed)
Repeat phlebotomy in 2 mos

## 2019-11-13 ENCOUNTER — Ambulatory Visit: Payer: Medicare Other | Attending: Internal Medicine

## 2019-11-13 DIAGNOSIS — Z23 Encounter for immunization: Secondary | ICD-10-CM | POA: Insufficient documentation

## 2019-11-13 NOTE — Progress Notes (Signed)
   Covid-19 Vaccination Clinic  Name:  Devin Schmidt    MRN: [id number] DOB: 1941-10-17  11/13/2019  Mr. Devin Schmidt was observed post Covid-19 immunization for 15 minutes without incidence. He was provided with Vaccine Information Sheet and instruction to access the V-Safe system.   Mr. Devin Schmidt was instructed to call 911 with any severe reactions post vaccine: Marland Kitchen Difficulty breathing  . Swelling of your face and throat  . A fast heartbeat  . A bad rash all over your body  . Dizziness and weakness    Immunizations Administered    Name Date Dose VIS Date Route   Pfizer COVID-19 Vaccine 11/13/2019  8:35 AM 0.3 mL 10/02/2019 Intramuscular   Manufacturer: [place]   Lot: [id number]   [place]: [id number]

## 2019-11-25 ENCOUNTER — Other Ambulatory Visit (INDEPENDENT_AMBULATORY_CARE_PROVIDER_SITE_OTHER): Payer: Medicare Other

## 2019-11-25 LAB — CBC WITH DIFFERENTIAL/PLATELET
Basophils Absolute: 0 10*3/uL (ref 0.0–0.1)
Basophils Relative: 0.4 % (ref 0.0–3.0)
Eosinophils Absolute: 0.2 10*3/uL (ref 0.0–0.7)
Eosinophils Relative: 3.4 % (ref 0.0–5.0)
HCT: 48.2 % (ref 39.0–52.0)
Hemoglobin: 16.1 g/dL (ref 13.0–17.0)
Lymphocytes Relative: 14.5 % (ref 12.0–46.0)
Lymphs Abs: 0.9 10*3/uL (ref 0.7–4.0)
MCHC: 33.4 g/dL (ref 30.0–36.0)
MCV: 100.8 fl — ABNORMAL HIGH (ref 78.0–100.0)
Monocytes Absolute: 0.8 10*3/uL (ref 0.1–1.0)
Monocytes Relative: 13 % — ABNORMAL HIGH (ref 3.0–12.0)
Neutro Abs: 4 10*3/uL (ref 1.4–7.7)
Neutrophils Relative %: 68.7 % (ref 43.0–77.0)
Platelets: 158 10*3/uL (ref 150.0–400.0)
RBC: 4.78 Mil/uL (ref 4.22–5.81)
RDW: 13.2 % (ref 11.5–15.5)
WBC: 5.9 10*3/uL (ref 4.0–10.5)

## 2019-12-03 ENCOUNTER — Ambulatory Visit: Payer: Medicare Other | Attending: Internal Medicine

## 2019-12-03 DIAGNOSIS — Z23 Encounter for immunization: Secondary | ICD-10-CM | POA: Insufficient documentation

## 2019-12-03 NOTE — Progress Notes (Signed)
   Covid-19 Vaccination Clinic  Name:  Devin Schmidt    MRN: [id number] DOB: 27-Jan-1941  12/03/2019  Mr. Wallett was observed post Covid-19 immunization for 15 minutes without incidence. He was provided with Vaccine Information Sheet and instruction to access the V-Safe system.   Mr. Makovec was instructed to call 911 with any severe reactions post vaccine: Marland Kitchen Difficulty breathing  . Swelling of your face and throat  . A fast heartbeat  . A bad rash all over your body  . Dizziness and weakness    Immunizations Administered    Name Date Dose VIS Date Route   Pfizer COVID-19 Vaccine 12/03/2019 10:45 AM 0.3 mL 10/02/2019 Intramuscular   Manufacturer: [place]   Lot: [id number]   [place]: [id number]

## 2019-12-28 ENCOUNTER — Telehealth: Payer: Self-pay | Admitting: Family Medicine

## 2019-12-28 NOTE — Telephone Encounter (Signed)
Medication: primidone (MYSOLINE) 50 MG tablet [[id number]]   Has the patient contacted their pharmacy? No. (If no, request that the patient contact the pharmacy for the refill.) (If yes, when and what did the pharmacy advise?)  Preferred Pharmacy (with phone number or street name): [id number] - [place], [place] - [address]  [address] [place] [address]  Phone:  [phone] Fax:  [phone]  DEA #:  --  Agent: Please be advised that RX refills may take up to 3 business days. We ask that you follow-up with your pharmacy.  

## 2020-01-26 ENCOUNTER — Other Ambulatory Visit (INDEPENDENT_AMBULATORY_CARE_PROVIDER_SITE_OTHER): Payer: Medicare Other

## 2020-01-26 ENCOUNTER — Telehealth: Payer: Self-pay

## 2020-01-26 LAB — CBC WITH DIFFERENTIAL/PLATELET
Basophils Absolute: 0.1 10*3/uL (ref 0.0–0.1)
Basophils Relative: 1 % (ref 0.0–3.0)
Eosinophils Absolute: 0.3 10*3/uL (ref 0.0–0.7)
Eosinophils Relative: 5.6 % — ABNORMAL HIGH (ref 0.0–5.0)
HCT: 46.4 % (ref 39.0–52.0)
Hemoglobin: 15.8 g/dL (ref 13.0–17.0)
Lymphocytes Relative: 17.5 % (ref 12.0–46.0)
Lymphs Abs: 1 10*3/uL (ref 0.7–4.0)
MCHC: 34 g/dL (ref 30.0–36.0)
MCV: 100.4 fl — ABNORMAL HIGH (ref 78.0–100.0)
Monocytes Absolute: 0.8 10*3/uL (ref 0.1–1.0)
Monocytes Relative: 13.9 % — ABNORMAL HIGH (ref 3.0–12.0)
Neutro Abs: 3.5 10*3/uL (ref 1.4–7.7)
Neutrophils Relative %: 62 % (ref 43.0–77.0)
Platelets: 155 10*3/uL (ref 150.0–400.0)
RBC: 4.62 Mil/uL (ref 4.22–5.81)
RDW: 12.8 % (ref 11.5–15.5)
WBC: 5.7 10*3/uL (ref 4.0–10.5)

## 2020-01-26 NOTE — Telephone Encounter (Signed)
Lab called for CBC and phlebotomy orders , standing ones put in .

## 2020-03-24 ENCOUNTER — Other Ambulatory Visit (INDEPENDENT_AMBULATORY_CARE_PROVIDER_SITE_OTHER): Payer: Medicare Other

## 2020-03-24 LAB — CBC WITH DIFFERENTIAL/PLATELET
Basophils Absolute: 0 10*3/uL (ref 0.0–0.1)
Basophils Relative: 0.6 % (ref 0.0–3.0)
Eosinophils Absolute: 0.2 10*3/uL (ref 0.0–0.7)
Eosinophils Relative: 3.4 % (ref 0.0–5.0)
HCT: 45.2 % (ref 39.0–52.0)
Hemoglobin: 15.4 g/dL (ref 13.0–17.0)
Lymphocytes Relative: 11 % — ABNORMAL LOW (ref 12.0–46.0)
Lymphs Abs: 0.8 10*3/uL (ref 0.7–4.0)
MCHC: 34.1 g/dL (ref 30.0–36.0)
MCV: 99.3 fl (ref 78.0–100.0)
Monocytes Absolute: 0.7 10*3/uL (ref 0.1–1.0)
Monocytes Relative: 9.8 % (ref 3.0–12.0)
Neutro Abs: 5.4 10*3/uL (ref 1.4–7.7)
Neutrophils Relative %: 75.2 % (ref 43.0–77.0)
Platelets: 159 10*3/uL (ref 150.0–400.0)
RBC: 4.55 Mil/uL (ref 4.22–5.81)
RDW: 13.3 % (ref 11.5–15.5)
WBC: 7.2 10*3/uL (ref 4.0–10.5)

## 2020-05-12 ENCOUNTER — Telehealth: Payer: Self-pay | Admitting: Family Medicine

## 2020-05-12 NOTE — Telephone Encounter (Signed)
Called pt x2. Someone answered both time but never said any thing. Unable to LM. Pt dut to schedule Medicare Annual Wellness Visit (AWV) with Nurse Health Advisor   This should be a 55 MINUTE VISIT.  Last AWV 09/20/14

## 2020-05-24 ENCOUNTER — Encounter: Payer: Self-pay | Admitting: Family Medicine

## 2020-05-24 ENCOUNTER — Other Ambulatory Visit: Payer: Self-pay

## 2020-05-24 ENCOUNTER — Ambulatory Visit (INDEPENDENT_AMBULATORY_CARE_PROVIDER_SITE_OTHER): Payer: Medicare Other | Admitting: Family Medicine

## 2020-05-24 VITALS — BP 124/60 | HR 81 | Temp 98.3°F | Resp 18 | Ht 72.0 in | Wt 196.4 lb

## 2020-05-24 DIAGNOSIS — R21 Rash and other nonspecific skin eruption: Secondary | ICD-10-CM | POA: Diagnosis not present

## 2020-05-24 DIAGNOSIS — D229 Melanocytic nevi, unspecified: Secondary | ICD-10-CM | POA: Diagnosis not present

## 2020-05-24 MED ORDER — DOXYCYCLINE HYCLATE 100 MG PO TABS: 100.0000 mg | ORAL_TABLET | Freq: Two times a day (BID) | ORAL | 0 refills | Status: DC

## 2020-05-24 MED ORDER — TRIAMCINOLONE ACETONIDE 0.1 % EX CREA: 1.0000 "application " | TOPICAL_CREAM | Freq: Two times a day (BID) | CUTANEOUS | 0 refills | Status: DC

## 2020-05-24 NOTE — Patient Instructions (Signed)
Insect Bite, Adult An insect bite can make your skin red, itchy, and swollen. An insect bite is different from an insect sting, which happens when an insect injects poison (venom) into the skin. Some insects can spread disease to people through a bite. However, most insect bites do not lead to disease and are not serious. What are the causes? Insects may bite for a variety of reasons, including:  Hunger.  To defend themselves. Insects that bite include:  Spiders.  Mosquitoes.  Ticks.  Fleas.  Ants.  Flies.  Kissing bugs.  Chiggers. What are the signs or symptoms? Symptoms of this condition include:  Itching or pain in the bite area.  Redness and swelling in the bite area.  An open wound (skin ulcer). In many cases, symptoms last for 2-4 days. In rare cases, a person may have a severe allergic reaction (anaphylactic reaction) to a bite. Symptoms of an anaphylactic reaction may include:  Feeling warm in the face (flushed). This may include redness.  Itchy, red, swollen areas of skin (hives).  Swelling of the eyes, lips, face, mouth, tongue, or throat.  Difficulty breathing, speaking, or swallowing.  Noisy breathing (wheezing).  Dizziness or light-headedness.  Fainting.  Pain or cramping in the abdomen.  Vomiting.  Diarrhea. How is this diagnosed? This condition is usually diagnosed based on symptoms and a physical exam. How is this treated? Treatment is usually not needed. Symptoms often go away on their own. When treatment is recommended, it may involve:  Applying a cream or lotion to the bite area. This treatment helps with itching.  Taking an antibiotic medicine. This treatment is needed if the bite area gets infected.  Getting a tetanus shot, if you are not up to date on this vaccine.  Applying ice to the affected area.  Allergy medicines called antihistamines. This treatment may be needed if you develop itching or an allergic reaction to the  insect bite.  Giving yourself an epinephrine injection if you have an anaphylactic reaction to a bite. To give the injection, you will use what is commonly called an auto-injector "pen" (pre-filled automatic epinephrine injection device). Your health care provider will teach you how to use an auto-injector pen. Follow these instructions at home: Bite area care   Do not scratch the bite area.  Keep the bite area clean and dry. Wash it every day with soap and water as told by your health care provider.  Check the bite area every day for signs of infection. Check for: ? Redness, swelling, or pain. ? Fluid or blood. ? Warmth. ? Pus or a bad smell. Managing pain, itching, and swelling   You may apply cortisone cream, calamine lotion, or a paste made of baking soda and water to the bite area as told by your health care provider.  If directed, put ice on the bite area. ? Put ice in a plastic bag. ? Place a towel between your skin and the bag. ? Leave the ice on for 20 minutes, 2-3 times a day. General instructions  Apply or take over-the-counter and prescription medicines only as told by your health care provider.  If you were prescribed an antibiotic medicine, take or apply it as told by your health care provider. Do not stop using the antibiotic even if your condition improves.  Keep all follow-up visits as told by your health care provider. This is important. How is this prevented? To help reduce your risk of insect bites:  When you are outdoors,   wear clothing that covers your arms and legs. This is especially important in the early morning and evening.  Use insect repellent. The best insect repellents contain DEET, picaridin, oil of lemon eucalyptus (OLE), or IR3535.  Consider spraying your clothing with a pesticide called permethrin. Permethrin helps prevent insect bites. It works for several weeks and for up to 5-6 clothing washes. Do not apply permethrin directly to the  skin.  If your home windows do not have screens, consider installing them.  If you will be sleeping in an area where there are mosquitoes, consider covering your sleeping area with a mosquito net. Contact a health care provider if:  You have redness, swelling, or pain in the bite area.  You have fluid or blood coming from the bite area.  The bite area feels warm to the touch.  You have pus or a bad smell coming from the bite area.  You have a fever. Get help right away if:  You have joint pain.  You have a rash.  You feel unusually tired or sleepy.  You have neck pain.  You have a headache.  You have unusual weakness.  You develop symptoms of an anaphylactic reaction. These may include: ? Flushed skin. ? Hives. ? Swelling of the eyes, lips, face, mouth, tongue, or throat. ? Difficulty breathing, speaking, or swallowing. ? Wheezing. ? Dizziness or light-headedness. ? Fainting. ? Pain or cramping in the abdomen. ? Vomiting. ? Diarrhea. These symptoms may represent a serious problem that is an emergency. Do not wait to see if the symptoms will go away. Do the following right away:  Use the auto-injector pen as you have been instructed.  Get medical help. Call your local emergency services (911 in the [place] Do not drive yourself to the hospital. Summary  An insect bite can make your skin red, itchy, and swollen.  Treatment is usually not needed. Symptoms often go away on their own. When treatment is recommended, it may involve taking medicine, applying medicine to the area, or applying ice.  Apply or take over-the-counter and prescription medicines only as told by your health care provider.  Use insect repellent to help prevent insect bites.  Contact a health care provider if you have any signs of infection in the bite area. This information is not intended to replace advice given to you by your health care provider. Make sure you discuss any questions you have  with your health care provider. Document Revised: 04/18/2018 Document Reviewed: 04/18/2018 Elsevier Patient Education  2020 Elsevier Inc.  

## 2020-05-24 NOTE — Progress Notes (Signed)
Patient ID: Devin Schmidt, male    DOB: 1940-12-05  Age: 79 y.o. MRN: [id number]    Subjective:  Subjective  HPI KOBY PICKUP presents for rash on r shoulder   ? Insect bite --- unusure of etiology No new soaps, detergents, lotions etc.   He also has a mole he would like [name] to look at  Review of Systems  Constitutional: Negative for appetite change, diaphoresis, fatigue and unexpected weight change.  Eyes: Negative for pain, redness and visual disturbance.  Respiratory: Negative for cough, chest tightness, shortness of breath and wheezing.   Cardiovascular: Negative for chest pain, palpitations and leg swelling.  Endocrine: Negative for cold intolerance, heat intolerance, polydipsia, polyphagia and polyuria.  Genitourinary: Negative for difficulty urinating, dysuria and frequency.  Skin: Positive for rash.  Neurological: Negative for dizziness, light-headedness, numbness and headaches.    History Past Medical History:  Diagnosis Date  . Allergy   . Anal fissure - posterior 10/10/2016  . Colon polyps    Tubular Adenoma and Hyperplastic   . COPD (chronic obstructive pulmonary disease) ([place])   . Epididymitis 01/03/2015  . Hemochromatosis    HX OF  . Inflamed seborrheic keratosis   . Sinusitis, acute maxillary 04/29/2015  . URI (upper respiratory infection)     He has a past surgical history that includes Colonoscopy w/ biopsies (10/20/2008); Hemorrhoid banding; Inguinal hernia repair (Right, 02/01/2015); Appendectomy (1950's); Tonsillectomy (1950's); Inguinal hernia repair (N/A, 02/01/2015); and Insertion of mesh (Right, 02/01/2015).   His family history is not on file.He reports that he quit smoking about 10 years ago. His smoking use included cigarettes. He has a 50.00 pack-year smoking history. He has never used smokeless tobacco. He reports current alcohol use of about 2.0 standard drinks of alcohol per week. He reports that he does not use drugs.  Current Outpatient Medications  on File Prior to Visit  Medication Sig Dispense Refill  . primidone (MYSOLINE) 50 MG tablet Take 1 tablet (50 mg total) by mouth at bedtime. 90 tablet 1   No current facility-administered medications on file prior to visit.     Objective:  Objective  Physical Exam Vitals reviewed.  Constitutional:      General: He is sleeping.     Appearance: He is well-developed.  HENT:     Head: Normocephalic and atraumatic.  Eyes:     Pupils: Pupils are equal, round, and reactive to light.  Neck:     Thyroid: No thyromegaly.  Cardiovascular:     Rate and Rhythm: Normal rate and regular rhythm.     Heart sounds: No murmur heard.   Pulmonary:     Effort: Pulmonary effort is normal. No respiratory distress.     Breath sounds: Normal breath sounds. No wheezing or rales.  Chest:     Chest wall: No tenderness.  Musculoskeletal:        General: No tenderness.     Cervical back: Normal range of motion and neck supple.  Skin:    General: Skin is warm and dry.     Findings: Rash present.  Neurological:     Mental Status: He is oriented to person, place, and time.  Psychiatric:        Behavior: Behavior normal.        Thought Content: Thought content normal.        Judgment: Judgment normal.        BP 124/60 (BP Location: Right Arm, Patient Position: Sitting, Cuff Size: Normal)  Pulse 81   Temp 98.3 F (36.8 C) (Oral)   Resp 18   Ht 6' (1.829 m)   Wt 196 lb 6.4 oz (89.1 kg)   SpO2 96%   BMI 26.64 kg/m  Wt Readings from Last 3 Encounters:  05/24/20 196 lb 6.4 oz (89.1 kg)  09/14/19 196 lb 12.8 oz (89.3 kg)  06/19/18 190 lb 3.2 oz (86.3 kg)     Lab Results  Component Value Date   WBC 7.7 05/25/2020   HGB 15.9 05/25/2020   HCT 46.6 05/25/2020   PLT 163.0 05/25/2020   GLUCOSE 91 09/14/2019   CHOL 165 09/14/2019   TRIG 206.0 (H) 09/14/2019   HDL 59.80 09/14/2019   LDLDIRECT 72.0 09/14/2019   LDLCALC 100 (H) 12/13/2017   ALT 14 09/14/2019   AST 15 09/14/2019   NA 140  09/14/2019   K 4.3 09/14/2019   CL 102 09/14/2019   CREATININE 1.24 09/14/2019   BUN 16 09/14/2019   CO2 29 09/14/2019   TSH 0.60 11/20/2012   PSA 1.22 09/14/2019    CT Abdomen Pelvis Wo Contrast  Result Date: 07/02/2016 CLINICAL DATA:  History of inguinal hernia or repair. Right groin pain for 2 days. EXAM: CT ABDOMEN AND PELVIS WITHOUT CONTRAST TECHNIQUE: Multidetector CT imaging of the abdomen and pelvis was performed following the standard protocol without IV contrast. COMPARISON:  None. FINDINGS: Lower chest: Lung bases are clear. No effusions. Heart is normal size. Hepatobiliary: Small hypodensities in the liver compatible with small cysts. No additional focal hepatic abnormality. Layering small gallstones in the gallbladder. No biliary ductal dilatation. Pancreas: No focal abnormality or ductal dilatation. Spleen: Calcifications throughout the spleen compatible granulomatous disease. Normal size. Adrenals/Urinary Tract: Bilateral renal perinephric stranding, likely related to old insult. No hydronephrosis or focal abnormality. No visible stones. Adrenal glands and urinary bladder unremarkable. Stomach/Bowel: Stomach, large and small bowel grossly unremarkable. Vascular/Lymphatic: Diffuse aortic and iliac calcifications. No aneurysm. No adenopathy. Reproductive: Markedly enlarged prostate, measuring 7.2 x 7.0 cm. Other: No free fluid or free air. Postoperative changes from right inguinal hernia repair. No recurrent or residual hernia on the right. There is a small to moderate-sized left inguinal hernia containing fat. Musculoskeletal: No acute bony abnormality. Degenerative changes in the lumbar spine. IMPRESSION: Cholelithiasis. 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 Prior right inguinal hernia repair without recurrent hernia. Small to moderate left inguinal hernia containing fat. Aortoiliac atherosclerosis. Electronically Signed   By: Rolm Baptise M.D.   On: 07/02/2016 11:12     Assessment &  Plan:  Plan  I am having Vanessa Ralphs. Wehmeyer start on doxycycline and triamcinolone cream. I am also having him maintain his primidone.  Meds ordered this encounter  Medications  . doxycycline (VIBRA-TABS) 100 MG tablet    Sig: Take 1 tablet (100 mg total) by mouth 2 (two) times daily.    Dispense:  20 tablet    Refill:  0  . triamcinolone cream (KENALOG) 0.1 %    Sig: Apply 1 application topically 2 (two) times daily.    Dispense:  30 g    Refill:  0    Problem List Items Addressed This Visit    None    Visit Diagnoses    Rash    -  Primary   Relevant Medications   doxycycline (VIBRA-TABS) 100 MG tablet   triamcinolone cream (KENALOG) 0.1 %   Suspicious nevus       Relevant Orders   Ambulatory referral to Dermatology  Follow-up: Return if symptoms worsen or fail to improve.  Ann Held, DO

## 2020-05-25 ENCOUNTER — Other Ambulatory Visit (INDEPENDENT_AMBULATORY_CARE_PROVIDER_SITE_OTHER): Payer: Medicare Other

## 2020-05-25 ENCOUNTER — Telehealth: Payer: Self-pay | Admitting: Dermatology

## 2020-05-25 LAB — CBC WITH DIFFERENTIAL/PLATELET
Basophils Absolute: 0 10*3/uL (ref 0.0–0.1)
Basophils Relative: 0.5 % (ref 0.0–3.0)
Eosinophils Absolute: 0.3 10*3/uL (ref 0.0–0.7)
Eosinophils Relative: 4 % (ref 0.0–5.0)
HCT: 46.6 % (ref 39.0–52.0)
Hemoglobin: 15.9 g/dL (ref 13.0–17.0)
Lymphocytes Relative: 10.3 % — ABNORMAL LOW (ref 12.0–46.0)
Lymphs Abs: 0.8 10*3/uL (ref 0.7–4.0)
MCHC: 34.2 g/dL (ref 30.0–36.0)
MCV: 98.9 fl (ref 78.0–100.0)
Monocytes Absolute: 0.9 10*3/uL (ref 0.1–1.0)
Monocytes Relative: 11.6 % (ref 3.0–12.0)
Neutro Abs: 5.6 10*3/uL (ref 1.4–7.7)
Neutrophils Relative %: 73.6 % (ref 43.0–77.0)
Platelets: 163 10*3/uL (ref 150.0–400.0)
RBC: 4.71 Mil/uL (ref 4.22–5.81)
RDW: 13.4 % (ref 11.5–15.5)
WBC: 7.7 10*3/uL (ref 4.0–10.5)

## 2020-05-25 NOTE — Telephone Encounter (Signed)
Patient called for a referral appointment from Roma Schanz, MD.  Appointment is scheduled for 11/15/2020 at 11:00 (Arrival time 10:45) with Lavonna Monarch, MD.

## 2020-06-20 DIAGNOSIS — H903 Sensorineural hearing loss, bilateral: Secondary | ICD-10-CM | POA: Diagnosis not present

## 2020-06-20 DIAGNOSIS — H906 Mixed conductive and sensorineural hearing loss, bilateral: Secondary | ICD-10-CM | POA: Diagnosis not present

## 2020-06-20 DIAGNOSIS — H60543 Acute eczematoid otitis externa, bilateral: Secondary | ICD-10-CM | POA: Diagnosis not present

## 2020-06-28 ENCOUNTER — Telehealth: Payer: Self-pay | Admitting: Family Medicine

## 2020-06-28 DIAGNOSIS — G250 Essential tremor: Secondary | ICD-10-CM

## 2020-06-28 MED ORDER — PRIMIDONE 50 MG PO TABS: 50.0000 mg | ORAL_TABLET | Freq: Every day | ORAL | 1 refills | Status: DC

## 2020-06-28 NOTE — Telephone Encounter (Signed)
Refill sent.

## 2020-06-28 NOTE — Telephone Encounter (Signed)
Medication: primidone (MYSOLINE) 50 MG tablet [[id number]]   Has the patient contacted their pharmacy? No. (If no, request that the patient contact the pharmacy for the refill.) (If yes, when and what did the pharmacy advise?)  Preferred Pharmacy (with phone number or street name): [place], [place].  [address] [place] [address]  Phone:  [phone] Fax:  [phone]  DEA #:  --  Agent: Please be advised that RX refills may take up to 3 business days. We ask that you follow-up with your pharmacy.

## 2020-07-11 ENCOUNTER — Encounter: Payer: Self-pay | Admitting: Family Medicine

## 2020-07-11 ENCOUNTER — Other Ambulatory Visit: Payer: Self-pay

## 2020-07-11 ENCOUNTER — Ambulatory Visit (INDEPENDENT_AMBULATORY_CARE_PROVIDER_SITE_OTHER): Payer: Medicare Other | Admitting: Family Medicine

## 2020-07-11 VITALS — BP 110/60 | HR 63 | Temp 97.7°F | Resp 18 | Ht 72.0 in | Wt 194.0 lb

## 2020-07-11 DIAGNOSIS — K6289 Other specified diseases of anus and rectum: Secondary | ICD-10-CM

## 2020-07-11 DIAGNOSIS — Z23 Encounter for immunization: Secondary | ICD-10-CM

## 2020-07-11 NOTE — Patient Instructions (Signed)

## 2020-07-11 NOTE — Progress Notes (Signed)
Patient ID: Devin Schmidt, male    DOB: 04-09-1941  Age: 79 y.o. MRN: [id number]    Subjective:  Subjective  HPI Devin AMEDEE presents for rectal pain last week.   It felt like a hemorrhoid    He had some loose stool,----- and has had normal bm since then     Review of Systems  Constitutional: Negative for appetite change, diaphoresis, fatigue and unexpected weight change.  Eyes: Negative for pain, redness and visual disturbance.  Respiratory: Negative for cough, chest tightness, shortness of breath and wheezing.   Cardiovascular: Negative for chest pain, palpitations and leg swelling.  Gastrointestinal: Positive for rectal pain. Negative for abdominal distention, abdominal pain, anal bleeding, blood in stool, constipation, diarrhea, nausea and vomiting.  Endocrine: Negative for cold intolerance, heat intolerance, polydipsia, polyphagia and polyuria.  Genitourinary: Negative for difficulty urinating, dysuria and frequency.  Neurological: Negative for dizziness, light-headedness, numbness and headaches.    History Past Medical History:  Diagnosis Date   Allergy    Anal fissure - posterior 10/10/2016   Colon polyps    Tubular Adenoma and Hyperplastic    COPD (chronic obstructive pulmonary disease) ([place])    Epididymitis 01/03/2015   Hemochromatosis    HX OF   Inflamed seborrheic keratosis    Sinusitis, acute maxillary 04/29/2015   URI (upper respiratory infection)     He has a past surgical history that includes Colonoscopy w/ biopsies (10/20/2008); Hemorrhoid banding; Inguinal hernia repair (Right, 02/01/2015); Appendectomy (1950's); Tonsillectomy (1950's); Inguinal hernia repair (N/A, 02/01/2015); and Insertion of mesh (Right, 02/01/2015).   His family history is not on file.He reports that he quit smoking about 10 years ago. His smoking use included cigarettes. He has a 50.00 pack-year smoking history. He has never used smokeless tobacco. He reports current alcohol use of  about 2.0 standard drinks of alcohol per week. He reports that he does not use drugs.  Current Outpatient Medications on File Prior to Visit  Medication Sig Dispense Refill   primidone (MYSOLINE) 50 MG tablet Take 1 tablet (50 mg total) by mouth at bedtime. 90 tablet 1   triamcinolone cream (KENALOG) 0.1 % Apply 1 application topically 2 (two) times daily. 30 g 0   No current facility-administered medications on file prior to visit.     Objective:  Objective  Physical Exam Vitals and nursing note reviewed.  Constitutional:      General: He is sleeping.     Appearance: He is well-developed.  HENT:     Head: Normocephalic and atraumatic.  Eyes:     Pupils: Pupils are equal, round, and reactive to light.  Neck:     Thyroid: No thyromegaly.  Cardiovascular:     Rate and Rhythm: Normal rate and regular rhythm.     Heart sounds: No murmur heard.   Pulmonary:     Effort: Pulmonary effort is normal. No respiratory distress.     Breath sounds: Normal breath sounds. No wheezing or rales.  Chest:     Chest wall: No tenderness.  Genitourinary:    Prostate: Normal.     Rectum: Normal. Guaiac result negative.     Comments: No ext hemorrhoids  Musculoskeletal:        General: No tenderness.     Cervical back: Normal range of motion and neck supple.  Skin:    General: Skin is warm and dry.  Neurological:     Mental Status: He is oriented to person, place, and time.  Psychiatric:  Behavior: Behavior normal.        Thought Content: Thought content normal.        Judgment: Judgment normal.    BP 110/60 (BP Location: Right Arm, Patient Position: Sitting, Cuff Size: Normal)    Pulse 63    Temp 97.7 F (36.5 C) (Oral)    Resp 18    Ht 6' (1.829 m)    Wt 194 lb (88 kg)    SpO2 98%    BMI 26.31 kg/m  Wt Readings from Last 3 Encounters:  07/11/20 194 lb (88 kg)  05/24/20 196 lb 6.4 oz (89.1 kg)  09/14/19 196 lb 12.8 oz (89.3 kg)     Lab Results  Component Value Date   WBC  7.7 05/25/2020   HGB 15.9 05/25/2020   HCT 46.6 05/25/2020   PLT 163.0 05/25/2020   GLUCOSE 91 09/14/2019   CHOL 165 09/14/2019   TRIG 206.0 (H) 09/14/2019   HDL 59.80 09/14/2019   LDLDIRECT 72.0 09/14/2019   LDLCALC 100 (H) 12/13/2017   ALT 14 09/14/2019   AST 15 09/14/2019   NA 140 09/14/2019   K 4.3 09/14/2019   CL 102 09/14/2019   CREATININE 1.24 09/14/2019   BUN 16 09/14/2019   CO2 29 09/14/2019   TSH 0.60 11/20/2012   PSA 1.22 09/14/2019    CT Abdomen Pelvis Wo Contrast  Result Date: 07/02/2016 CLINICAL DATA:  History of inguinal hernia or repair. Right groin pain for 2 days. EXAM: CT ABDOMEN AND PELVIS WITHOUT CONTRAST TECHNIQUE: Multidetector CT imaging of the abdomen and pelvis was performed following the standard protocol without IV contrast. COMPARISON:  None. FINDINGS: Lower chest: Lung bases are clear. No effusions. Heart is normal size. Hepatobiliary: Small hypodensities in the liver compatible with small cysts. No additional focal hepatic abnormality. Layering small gallstones in the gallbladder. No biliary ductal dilatation. Pancreas: No focal abnormality or ductal dilatation. Spleen: Calcifications throughout the spleen compatible granulomatous disease. Normal size. Adrenals/Urinary Tract: Bilateral renal perinephric stranding, likely related to old insult. No hydronephrosis or focal abnormality. No visible stones. Adrenal glands and urinary bladder unremarkable. Stomach/Bowel: Stomach, large and small bowel grossly unremarkable. Vascular/Lymphatic: Diffuse aortic and iliac calcifications. No aneurysm. No adenopathy. Reproductive: Markedly enlarged prostate, measuring 7.2 x 7.0 cm. Other: No free fluid or free air. Postoperative changes from right inguinal hernia repair. No recurrent or residual hernia on the right. There is a small to moderate-sized left inguinal hernia containing fat. Musculoskeletal: No acute bony abnormality. Degenerative changes in the lumbar spine.  IMPRESSION: Cholelithiasis. 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 Prior right inguinal hernia repair without recurrent hernia. Small to moderate left inguinal hernia containing fat. Aortoiliac atherosclerosis. Electronically Signed   By: Rolm Baptise M.D.   On: 07/02/2016 11:12     Assessment & Plan:  Plan  I have discontinued Barbara Keng. Wafer's doxycycline. I am also having him maintain his triamcinolone cream and primidone.  No orders of the defined types were placed in this encounter.   Problem List Items Addressed This Visit      Unprioritized   Rectal pain - Primary    ? Int hemorrhoids------pain / pressure has resolved --  if pain comes back pt will try prep H He has app with GI next month  Inc fiber in diet Sitz baths if needed Drink plenty of water  rto prn        Other Visit Diagnoses    Need for influenza vaccination  Relevant Orders   Flu Vaccine QUAD High Dose(Fluad) (Completed)      Follow-up: Return if symptoms worsen or fail to improve.  Ann Held, DO

## 2020-07-12 DIAGNOSIS — K6289 Other specified diseases of anus and rectum: Secondary | ICD-10-CM | POA: Insufficient documentation

## 2020-07-12 NOTE — Assessment & Plan Note (Signed)
?   Int hemorrhoids------pain / pressure has resolved --  if pain comes back pt will try prep H He has app with GI next month  Inc fiber in diet Sitz baths if needed Drink plenty of water  rto prn

## 2020-07-27 ENCOUNTER — Other Ambulatory Visit (INDEPENDENT_AMBULATORY_CARE_PROVIDER_SITE_OTHER): Payer: Medicare Other

## 2020-07-27 LAB — CBC WITH DIFFERENTIAL/PLATELET
Basophils Absolute: 0 10*3/uL (ref 0.0–0.1)
Basophils Relative: 0.6 % (ref 0.0–3.0)
Eosinophils Absolute: 0.3 10*3/uL (ref 0.0–0.7)
Eosinophils Relative: 4.6 % (ref 0.0–5.0)
HCT: 44.9 % (ref 39.0–52.0)
Hemoglobin: 15.2 g/dL (ref 13.0–17.0)
Lymphocytes Relative: 12 % (ref 12.0–46.0)
Lymphs Abs: 0.9 10*3/uL (ref 0.7–4.0)
MCHC: 33.8 g/dL (ref 30.0–36.0)
MCV: 97.9 fl (ref 78.0–100.0)
Monocytes Absolute: 0.8 10*3/uL (ref 0.1–1.0)
Monocytes Relative: 11.4 % (ref 3.0–12.0)
Neutro Abs: 5.1 10*3/uL (ref 1.4–7.7)
Neutrophils Relative %: 71.4 % (ref 43.0–77.0)
Platelets: 169 10*3/uL (ref 150.0–400.0)
RBC: 4.58 Mil/uL (ref 4.22–5.81)
RDW: 12.8 % (ref 11.5–15.5)
WBC: 7.1 10*3/uL (ref 4.0–10.5)

## 2020-08-01 ENCOUNTER — Other Ambulatory Visit: Payer: Self-pay

## 2020-08-02 ENCOUNTER — Encounter: Payer: Self-pay | Admitting: Gastroenterology

## 2020-08-02 ENCOUNTER — Ambulatory Visit: Payer: Medicare Other | Admitting: Gastroenterology

## 2020-08-02 VITALS — BP 114/68 | HR 60 | Ht 72.0 in | Wt 193.8 lb

## 2020-08-02 DIAGNOSIS — K602 Anal fissure, unspecified: Secondary | ICD-10-CM

## 2020-08-02 MED ORDER — AMBULATORY NON FORMULARY MEDICATION: 1 refills | Status: DC

## 2020-08-02 NOTE — Progress Notes (Signed)
08/02/2020 Devin Schmidt [id number] Sep 29, 1941   HISTORY OF PRESENT ILLNESS: This is a pleasant 79 year old male is a patient of Dr. Celesta Aver.  He is here today with complaints of anal irritation.  He tells me that about 6 weeks ago he woke up one morning with joint pain and also some anal discomfort that he thought was from the anal fissure that he has had in the past.  Was not quite sure what the association would be with the joint pain.  Nonetheless he used some hemorrhoid cream (Preparation H) and the irritation went away for the most part.  He is no longer having any significant issues but does have some burning discomfort when having a bowel movement.  He denies any bleeding.  He was treated for an anal fissure in 2017.  Last colonoscopy was in May 2019 at which time he was found to have internal hemorrhoids and a small lipoma in the ascending colon.   Past Medical History:  Diagnosis Date  . Allergy   . Anal fissure - posterior 10/10/2016  . Colon polyps    Tubular Adenoma and Hyperplastic   . COPD (chronic obstructive pulmonary disease) ([place])   . Epididymitis 01/03/2015  . Hemochromatosis    HX OF  . Inflamed seborrheic keratosis   . Sinusitis, acute maxillary 04/29/2015  . URI (upper respiratory infection)    Past Surgical History:  Procedure Laterality Date  . APPENDECTOMY  1950's  . COLONOSCOPY W/ BIOPSIES  10/20/2008   X 2  . HEMORRHOID BANDING    . INGUINAL HERNIA REPAIR Right 02/01/2015  . INGUINAL HERNIA REPAIR N/A 02/01/2015   Procedure: LAPAROSCOPIC RIGHT INGUINAL HERNIA REPAIR;  Surgeon: Doreen Salvage, MD;  Location: [facility];  Service: General;  Laterality: N/A;  . INSERTION OF MESH Right 02/01/2015   Procedure: INSERTION OF MESH;  Surgeon: Doreen Salvage, MD;  Location: [facility];  Service: General;  Laterality: Right;  . TONSILLECTOMY  1950's    reports that he quit smoking about 10 years ago. His smoking use included cigarettes. He has a 50.00 pack-year smoking history.  He has never used smokeless tobacco. He reports current alcohol use of about 2.0 standard drinks of alcohol per week. He reports that he does not use drugs. family history is not on file. No Known Allergies    Outpatient Encounter Medications as of 08/02/2020  Medication Sig  . primidone (MYSOLINE) 50 MG tablet Take 1 tablet (50 mg total) by mouth at bedtime.  . [DISCONTINUED] triamcinolone cream (KENALOG) 0.1 % Apply 1 application topically 2 (two) times daily.   No facility-administered encounter medications on file as of 08/02/2020.    REVIEW OF SYSTEMS  : All other systems reviewed and negative except where noted in the History of Present Illness.  PHYSICAL EXAM: BP 114/68   Pulse 60   Ht 6' (1.829 m)   Wt 193 lb 12.8 oz (87.9 kg)   BMI 26.28 kg/m  General: Well developed white male in no acute distress Head: Normocephalic and atraumatic Eyes:  Sclerae anicteric, conjunctiva pink. Ears: Normal auditory acuity Rectal:  Some brown stool seen in the perianal area with mild irritation/erythema of the skin.  No fissure seen, but then on DRE he was tender posteriorly and a could palpate a ridge just inside the anal canal. Musculoskeletal: Symmetrical with no gross deformities  Skin: No lesions on visible extremities Extremities: No edema  Neurological: Alert oriented x 4, grossly non-focal Psychological:  Alert and cooperative.  Normal mood and affect  ASSESSMENT AND PLAN: *Anal fissure, posterior:  Felt on exam today.  He's had fissure in the past.  Will prescribe diltiazem gel with lidocaine to be applied BID.  Prescription sent to pharmacy.  He will call back with an update in 3-4 weeks.  CC:  Ann Held, *

## 2020-08-02 NOTE — Patient Instructions (Signed)
If you are age 81 or older, your body mass index should be between 23-30. Your Body mass index is 26.28 kg/m. If this is out of the aforementioned range listed, please consider follow up with your Primary Care Provider.  If you are age 12 or younger, your body mass index should be between 19-25. Your Body mass index is 26.28 kg/m. If this is out of the aformentioned range listed, please consider follow up with your Primary Care Provider.   We have sent a prescription for Diltiazem 2% gel to [facility] for you. Using your index finger, you should apply a small amount of medication inside the rectum up to your first knuckle/joint twice daily x 4 weeks.  [facility]'s information is below: Address: [address] [place], [place] [address]  [phone]  *Please DO NOT go directly from our office to pick up this medication! Give the pharmacy 1 day to process the prescription as this is compounded and takes time to make.  Call back with an update in 3-4 weeks, ask for Koren Shiver, RN.

## 2020-08-24 DIAGNOSIS — L281 Prurigo nodularis: Secondary | ICD-10-CM | POA: Diagnosis not present

## 2020-08-24 DIAGNOSIS — L280 Lichen simplex chronicus: Secondary | ICD-10-CM | POA: Diagnosis not present

## 2020-08-31 ENCOUNTER — Telehealth: Payer: Self-pay | Admitting: Gastroenterology

## 2020-08-31 NOTE — Telephone Encounter (Signed)
Left message on machine to call back  

## 2020-08-31 NOTE — Telephone Encounter (Signed)
Pt is wanting to inform the nurse he is using the medication Lidocaine 5% mixed with Diltiazem gel 2%    Pt states he has noticed some improvement, pt would like to discuss further with the nurse.

## 2020-09-01 NOTE — Telephone Encounter (Signed)
The pt states that he is doing very well.  He will call if he does not continue to improve

## 2020-09-01 NOTE — Telephone Encounter (Signed)
Great. Thank you.

## 2020-09-19 ENCOUNTER — Other Ambulatory Visit: Payer: Self-pay

## 2020-09-19 ENCOUNTER — Ambulatory Visit (INDEPENDENT_AMBULATORY_CARE_PROVIDER_SITE_OTHER): Payer: Medicare Other | Admitting: Family Medicine

## 2020-09-19 ENCOUNTER — Encounter: Payer: Self-pay | Admitting: Family Medicine

## 2020-09-19 VITALS — BP 118/70 | HR 60 | Temp 97.5°F | Resp 18 | Ht 72.0 in | Wt 193.2 lb

## 2020-09-19 DIAGNOSIS — Z136 Encounter for screening for cardiovascular disorders: Secondary | ICD-10-CM

## 2020-09-19 DIAGNOSIS — G250 Essential tremor: Secondary | ICD-10-CM | POA: Diagnosis not present

## 2020-09-19 DIAGNOSIS — Z0000 Encounter for general adult medical examination without abnormal findings: Secondary | ICD-10-CM

## 2020-09-19 DIAGNOSIS — R351 Nocturia: Secondary | ICD-10-CM

## 2020-09-19 LAB — CBC WITH DIFFERENTIAL/PLATELET
Basophils Absolute: 0 10*3/uL (ref 0.0–0.1)
Basophils Relative: 0.5 % (ref 0.0–3.0)
Eosinophils Absolute: 0.2 10*3/uL (ref 0.0–0.7)
Eosinophils Relative: 4.3 % (ref 0.0–5.0)
HCT: 48.7 % (ref 39.0–52.0)
Hemoglobin: 16.3 g/dL (ref 13.0–17.0)
Lymphocytes Relative: 13.7 % (ref 12.0–46.0)
Lymphs Abs: 0.8 10*3/uL (ref 0.7–4.0)
MCHC: 33.5 g/dL (ref 30.0–36.0)
MCV: 98.6 fl (ref 78.0–100.0)
Monocytes Absolute: 0.8 10*3/uL (ref 0.1–1.0)
Monocytes Relative: 14.5 % — ABNORMAL HIGH (ref 3.0–12.0)
Neutro Abs: 3.8 10*3/uL (ref 1.4–7.7)
Neutrophils Relative %: 67 % (ref 43.0–77.0)
Platelets: 162 10*3/uL (ref 150.0–400.0)
RBC: 4.94 Mil/uL (ref 4.22–5.81)
RDW: 13.3 % (ref 11.5–15.5)
WBC: 5.7 10*3/uL (ref 4.0–10.5)

## 2020-09-19 LAB — COMPREHENSIVE METABOLIC PANEL
ALT: 12 U/L (ref 0–53)
AST: 15 U/L (ref 0–37)
Albumin: 4.2 g/dL (ref 3.5–5.2)
Alkaline Phosphatase: 63 U/L (ref 39–117)
BUN: 20 mg/dL (ref 6–23)
CO2: 33 mEq/L — ABNORMAL HIGH (ref 19–32)
Calcium: 9.1 mg/dL (ref 8.4–10.5)
Chloride: 103 mEq/L (ref 96–112)
Creatinine, Ser: 1.42 mg/dL (ref 0.40–1.50)
GFR: 46.24 mL/min — ABNORMAL LOW (ref >60.00)
Glucose, Bld: 85 mg/dL (ref 70–99)
Potassium: 4.9 mEq/L (ref 3.5–5.1)
Sodium: 140 mEq/L (ref 135–145)
Total Bilirubin: 0.6 mg/dL (ref 0.2–1.2)
Total Protein: 6.5 g/dL (ref 6.0–8.3)

## 2020-09-19 LAB — LIPID PANEL
Cholesterol: 172 mg/dL (ref 0–200)
HDL: 58.2 mg/dL (ref >39.00)
LDL Cholesterol: 86 mg/dL (ref 0–99)
NonHDL: 113.85
Total CHOL/HDL Ratio: 3
Triglycerides: 137 mg/dL (ref 0.0–149.0)
VLDL: 27.4 mg/dL (ref 0.0–40.0)

## 2020-09-19 LAB — PSA: PSA: 1.34 ng/mL (ref 0.10–4.00)

## 2020-09-19 NOTE — Patient Instructions (Signed)

## 2020-09-19 NOTE — Assessment & Plan Note (Signed)
ghm utd Check labs  See avs  

## 2020-09-19 NOTE — Progress Notes (Signed)
Patient ID: Devin Schmidt, male    DOB: [birth year]  Age: 79 y.o. MRN: [id number]    Subjective:  Subjective  HPI Devin Schmidt presents for cpe-- he has no new complaints   Review of Systems  Constitutional: Negative for appetite change, diaphoresis, fatigue and unexpected weight change.  Eyes: Negative for pain, redness and visual disturbance.  Respiratory: Negative for cough, chest tightness, shortness of breath and wheezing.   Cardiovascular: Negative for chest pain, palpitations and leg swelling.  Endocrine: Negative for cold intolerance, heat intolerance, polydipsia, polyphagia and polyuria.  Genitourinary: Positive for frequency. Negative for difficulty urinating and dysuria.  Neurological: Negative for dizziness, light-headedness, numbness and headaches.    History Past Medical History:  Diagnosis Date  . Allergy   . Anal fissure - posterior 10/10/2016  . Colon polyps    Tubular Adenoma and Hyperplastic   . COPD (chronic obstructive pulmonary disease) ([place])   . Epididymitis 01/03/2015  . Hemochromatosis    HX OF  . Inflamed seborrheic keratosis   . Sinusitis, acute maxillary 04/29/2015  . URI (upper respiratory infection)     He has a past surgical history that includes Colonoscopy w/ biopsies (10/20/2008); Hemorrhoid banding; Inguinal hernia repair (Right, 02/01/2015); Appendectomy (1950's); Tonsillectomy (1950's); Inguinal hernia repair (N/A, 02/01/2015); and Insertion of mesh (Right, 02/01/2015).   His family history is not on file.He reports that he quit smoking about 11 years ago. His smoking use included cigarettes. He has a 50.00 pack-year smoking history. He has never used smokeless tobacco. He reports current alcohol use of about 2.0 standard drinks of alcohol per week. He reports that he does not use drugs.  Current Outpatient Medications on File Prior to Visit  Medication Sig Dispense Refill  . AMBULATORY NON FORMULARY MEDICATION Lidocaine 5% mixed with Diltiazem  gel 2% : Apply rectal twice a day as needed. 30 g 1  . primidone (MYSOLINE) 50 MG tablet Take 1 tablet (50 mg total) by mouth at bedtime. 90 tablet 1   No current facility-administered medications on file prior to visit.     Objective:  Objective  Physical Exam Vitals and nursing note reviewed.  Constitutional:      General: He is sleeping. He is not in acute distress.    Appearance: He is well-developed. He is not diaphoretic.  HENT:     Head: Normocephalic and atraumatic.     Right Ear: External ear normal.     Left Ear: External ear normal.     Nose: Nose normal.     Mouth/Throat:     Pharynx: No oropharyngeal exudate.  Eyes:     General:        Right eye: No discharge.        Left eye: No discharge.     Conjunctiva/sclera: Conjunctivae normal.     Pupils: Pupils are equal, round, and reactive to light.  Neck:     Thyroid: No thyromegaly.     Vascular: No JVD.  Cardiovascular:     Rate and Rhythm: Normal rate and regular rhythm.     Heart sounds: No murmur heard.  No friction rub. No gallop.   Pulmonary:     Effort: Pulmonary effort is normal. No respiratory distress.     Breath sounds: Normal breath sounds. No wheezing or rales.  Chest:     Chest wall: No tenderness.  Abdominal:     General: Bowel sounds are normal. There is no distension.     Palpations: Abdomen is  soft. There is no mass.     Tenderness: There is no abdominal tenderness. There is no guarding or rebound.  Musculoskeletal:        General: No tenderness. Normal range of motion.     Cervical back: Normal range of motion and neck supple.  Lymphadenopathy:     Cervical: No cervical adenopathy.  Skin:    General: Skin is warm and dry.     Coloration: Skin is not pale.     Findings: No erythema or rash.  Neurological:     Mental Status: He is oriented to person, place, and time.     Motor: No abnormal muscle tone.     Deep Tendon Reflexes: Reflexes are normal and symmetric. Reflexes normal.    Psychiatric:        Behavior: Behavior normal.        Thought Content: Thought content normal.        Judgment: Judgment normal.    BP 118/70 (BP Location: Right Arm, Patient Position: Sitting, Cuff Size: Normal)   Pulse 60   Temp (!) 97.5 F (36.4 C) (Oral)   Resp 18   Ht 6' (1.829 m)   Wt 193 lb 3.2 oz (87.6 kg)   SpO2 99%   BMI 26.20 kg/m  Wt Readings from Last 3 Encounters:  09/19/20 193 lb 3.2 oz (87.6 kg)  08/02/20 193 lb 12.8 oz (87.9 kg)  07/11/20 194 lb (88 kg)     Lab Results  Component Value Date   WBC 5.7 09/19/2020   HGB 16.3 09/19/2020   HCT 48.7 09/19/2020   PLT 162.0 09/19/2020   GLUCOSE 85 09/19/2020   CHOL 172 09/19/2020   TRIG 137.0 09/19/2020   HDL 58.20 09/19/2020   LDLDIRECT 72.0 09/14/2019   LDLCALC 86 09/19/2020   ALT 12 09/19/2020   AST 15 09/19/2020   NA 140 09/19/2020   K 4.9 09/19/2020   CL 103 09/19/2020   CREATININE 1.42 09/19/2020   BUN 20 09/19/2020   CO2 33 (H) 09/19/2020   TSH 0.60 11/20/2012   PSA 1.34 09/19/2020    CT Abdomen Pelvis Wo Contrast  Result Date: 07/02/2016 CLINICAL DATA:  History of inguinal hernia or repair. Right groin pain for 2 days. EXAM: CT ABDOMEN AND PELVIS WITHOUT CONTRAST TECHNIQUE: Multidetector CT imaging of the abdomen and pelvis was performed following the standard protocol without IV contrast. COMPARISON:  None. FINDINGS: Lower chest: Lung bases are clear. No effusions. Heart is normal size. Hepatobiliary: Small hypodensities in the liver compatible with small cysts. No additional focal hepatic abnormality. Layering small gallstones in the gallbladder. No biliary ductal dilatation. Pancreas: No focal abnormality or ductal dilatation. Spleen: Calcifications throughout the spleen compatible granulomatous disease. Normal size. Adrenals/Urinary Tract: Bilateral renal perinephric stranding, likely related to old insult. No hydronephrosis or focal abnormality. No visible stones. Adrenal glands and urinary  bladder unremarkable. Stomach/Bowel: Stomach, large and small bowel grossly unremarkable. Vascular/Lymphatic: Diffuse aortic and iliac calcifications. No aneurysm. No adenopathy. Reproductive: Markedly enlarged prostate, measuring 7.2 x 7.0 cm. Other: No free fluid or free air. Postoperative changes from right inguinal hernia repair. No recurrent or residual hernia on the right. There is a small to moderate-sized left inguinal hernia containing fat. Musculoskeletal: No acute bony abnormality. Degenerative changes in the lumbar spine. IMPRESSION: Cholelithiasis. 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 Prior right inguinal hernia repair without recurrent hernia. Small to moderate left inguinal hernia containing fat. Aortoiliac atherosclerosis. Electronically Signed   By: Lennette Bihari  [place] M.D.   On: 07/02/2016 11:12     Assessment & Plan:  Plan  I am having Vanessa Ralphs. Tsou maintain his primidone and AMBULATORY NON FORMULARY MEDICATION.  No orders of the defined types were placed in this encounter.   Problem List Items Addressed This Visit      Unprioritized   Essential tremor    con't primidone        Relevant Orders   CBC with Differential/Platelet (Completed)   Comprehensive metabolic panel (Completed)   Hereditary hemochromatosis ([place])   Relevant Orders   CBC with Differential/Platelet (Completed)   Comprehensive metabolic panel (Completed)   Ischemic heart disease screen   Relevant Orders   Lipid panel (Completed)   Comprehensive metabolic panel (Completed)   Nocturia    No worse than previously Check psa       Relevant Orders   PSA (Completed)   Comprehensive metabolic panel (Completed)   Preventative health care - Primary    ghm utd Check labs  See avs           Follow-up: Return in about 1 year (around 09/19/2021), or if symptoms worsen or fail to improve, for annual exam, fasting.  Ann Held, DO

## 2020-09-19 NOTE — Assessment & Plan Note (Signed)
con't primidone

## 2020-09-19 NOTE — Assessment & Plan Note (Signed)
No worse than previously Check psa

## 2020-09-26 ENCOUNTER — Other Ambulatory Visit (INDEPENDENT_AMBULATORY_CARE_PROVIDER_SITE_OTHER): Payer: Medicare Other

## 2020-09-26 LAB — CBC WITH DIFFERENTIAL/PLATELET
Basophils Absolute: 0 10*3/uL (ref 0.0–0.1)
Basophils Relative: 0.6 % (ref 0.0–3.0)
Eosinophils Absolute: 0.3 10*3/uL (ref 0.0–0.7)
Eosinophils Relative: 4.4 % (ref 0.0–5.0)
HCT: 45.6 % (ref 39.0–52.0)
Hemoglobin: 15.5 g/dL (ref 13.0–17.0)
Lymphocytes Relative: 14.6 % (ref 12.0–46.0)
Lymphs Abs: 0.9 10*3/uL (ref 0.7–4.0)
MCHC: 34 g/dL (ref 30.0–36.0)
MCV: 97.5 fl (ref 78.0–100.0)
Monocytes Absolute: 0.7 10*3/uL (ref 0.1–1.0)
Monocytes Relative: 11.4 % (ref 3.0–12.0)
Neutro Abs: 4.2 10*3/uL (ref 1.4–7.7)
Neutrophils Relative %: 69 % (ref 43.0–77.0)
Platelets: 155 10*3/uL (ref 150.0–400.0)
RBC: 4.67 Mil/uL (ref 4.22–5.81)
RDW: 13 % (ref 11.5–15.5)
WBC: 6.1 10*3/uL (ref 4.0–10.5)

## 2020-09-27 ENCOUNTER — Other Ambulatory Visit: Payer: Self-pay

## 2020-11-15 ENCOUNTER — Ambulatory Visit: Payer: Medicare Other | Admitting: Dermatology

## 2020-11-28 ENCOUNTER — Other Ambulatory Visit (INDEPENDENT_AMBULATORY_CARE_PROVIDER_SITE_OTHER): Payer: Medicare Other

## 2020-11-28 LAB — CBC WITH DIFFERENTIAL/PLATELET
Basophils Absolute: 0 10*3/uL (ref 0.0–0.1)
Basophils Relative: 0.3 % (ref 0.0–3.0)
Eosinophils Absolute: 0.2 10*3/uL (ref 0.0–0.7)
Eosinophils Relative: 3.5 % (ref 0.0–5.0)
HCT: 45 % (ref 39.0–52.0)
Hemoglobin: 15.4 g/dL (ref 13.0–17.0)
Lymphocytes Relative: 11.2 % — ABNORMAL LOW (ref 12.0–46.0)
Lymphs Abs: 0.7 10*3/uL (ref 0.7–4.0)
MCHC: 34.2 g/dL (ref 30.0–36.0)
MCV: 96.5 fl (ref 78.0–100.0)
Monocytes Absolute: 0.8 10*3/uL (ref 0.1–1.0)
Monocytes Relative: 12 % (ref 3.0–12.0)
Neutro Abs: 4.8 10*3/uL (ref 1.4–7.7)
Neutrophils Relative %: 73 % (ref 43.0–77.0)
Platelets: 146 10*3/uL — ABNORMAL LOW (ref 150.0–400.0)
RBC: 4.67 Mil/uL (ref 4.22–5.81)
RDW: 13.2 % (ref 11.5–15.5)
WBC: 6.6 10*3/uL (ref 4.0–10.5)

## 2020-11-28 LAB — FERRITIN: Ferritin: 25.4 ng/mL (ref 22.0–322.0)

## 2020-12-23 ENCOUNTER — Telehealth: Payer: Self-pay | Admitting: Family Medicine

## 2020-12-23 DIAGNOSIS — G250 Essential tremor: Secondary | ICD-10-CM

## 2020-12-23 MED ORDER — PRIMIDONE 50 MG PO TABS: 50.0000 mg | ORAL_TABLET | Freq: Every day | ORAL | 1 refills | Status: DC

## 2020-12-23 NOTE — Telephone Encounter (Signed)
Refill sent.

## 2020-12-23 NOTE — Telephone Encounter (Signed)
Medication: primidone (MYSOLINE) 50 MG tablet [[id number]]       Has the patient contacted their pharmacy? (If no, request that the patient contact the pharmacy for the refill.) (If yes, when and what did the pharmacy advise?)     Preferred Pharmacy (with phone number or street name): [facility], [place].  [address] [place] [address]  Phone:  [phone] Fax:  [phone]     Agent: Please be advised that RX refills may take up to 3 business days. We ask that you follow-up with your pharmacy.

## 2021-01-24 ENCOUNTER — Other Ambulatory Visit (INDEPENDENT_AMBULATORY_CARE_PROVIDER_SITE_OTHER): Payer: Medicare Other

## 2021-01-24 LAB — CBC WITH DIFFERENTIAL/PLATELET
Basophils Absolute: 0 10*3/uL (ref 0.0–0.1)
Basophils Relative: 0.4 % (ref 0.0–3.0)
Eosinophils Absolute: 0.3 10*3/uL (ref 0.0–0.7)
Eosinophils Relative: 4.4 % (ref 0.0–5.0)
HCT: 46.8 % (ref 39.0–52.0)
Hemoglobin: 15.8 g/dL (ref 13.0–17.0)
Lymphocytes Relative: 13.8 % (ref 12.0–46.0)
Lymphs Abs: 0.8 10*3/uL (ref 0.7–4.0)
MCHC: 33.8 g/dL (ref 30.0–36.0)
MCV: 96.9 fl (ref 78.0–100.0)
Monocytes Absolute: 0.8 10*3/uL (ref 0.1–1.0)
Monocytes Relative: 12.5 % — ABNORMAL HIGH (ref 3.0–12.0)
Neutro Abs: 4.2 10*3/uL (ref 1.4–7.7)
Neutrophils Relative %: 68.9 % (ref 43.0–77.0)
Platelets: 159 10*3/uL (ref 150.0–400.0)
RBC: 4.83 Mil/uL (ref 4.22–5.81)
RDW: 13.5 % (ref 11.5–15.5)
WBC: 6.1 10*3/uL (ref 4.0–10.5)

## 2021-03-23 ENCOUNTER — Other Ambulatory Visit: Payer: Self-pay

## 2021-03-23 ENCOUNTER — Other Ambulatory Visit (INDEPENDENT_AMBULATORY_CARE_PROVIDER_SITE_OTHER): Payer: Medicare Other

## 2021-03-23 LAB — CBC
HCT: 46.4 % (ref 39.0–52.0)
Hemoglobin: 15.6 g/dL (ref 13.0–17.0)
MCHC: 33.7 g/dL (ref 30.0–36.0)
MCV: 97.4 fl (ref 78.0–100.0)
Platelets: 168 10*3/uL (ref 150.0–400.0)
RBC: 4.77 Mil/uL (ref 4.22–5.81)
RDW: 13.5 % (ref 11.5–15.5)
WBC: 4.7 10*3/uL (ref 4.0–10.5)

## 2021-03-27 ENCOUNTER — Other Ambulatory Visit: Payer: Self-pay

## 2021-04-18 DIAGNOSIS — Z20822 Contact with and (suspected) exposure to covid-19: Secondary | ICD-10-CM | POA: Diagnosis not present

## 2021-05-24 ENCOUNTER — Other Ambulatory Visit (INDEPENDENT_AMBULATORY_CARE_PROVIDER_SITE_OTHER): Payer: Medicare Other

## 2021-05-24 LAB — CBC
HCT: 44.2 % (ref 39.0–52.0)
Hemoglobin: 14.9 g/dL (ref 13.0–17.0)
MCHC: 33.7 g/dL (ref 30.0–36.0)
MCV: 97.8 fl (ref 78.0–100.0)
Platelets: 159 10*3/uL (ref 150.0–400.0)
RBC: 4.52 Mil/uL (ref 4.22–5.81)
RDW: 13.3 % (ref 11.5–15.5)
WBC: 6 10*3/uL (ref 4.0–10.5)

## 2021-05-24 LAB — FERRITIN: Ferritin: 19 ng/mL — ABNORMAL LOW (ref 22.0–322.0)

## 2021-05-30 ENCOUNTER — Other Ambulatory Visit: Payer: Self-pay | Admitting: *Deleted

## 2021-06-27 ENCOUNTER — Telehealth: Payer: Self-pay | Admitting: Family Medicine

## 2021-06-27 DIAGNOSIS — G250 Essential tremor: Secondary | ICD-10-CM

## 2021-06-27 NOTE — Telephone Encounter (Signed)
Medication: primidone (MYSOLINE) 50 MG tablet   Has the patient contacted their pharmacy? No. (If no, request that the patient contact the pharmacy for the refill.) (If yes, when and what did the pharmacy advise?)  Preferred Pharmacy (with phone number or street name): [facility], [place].  [address] [place] [address]  Phone:  [phone]    Agent: Please be advised that RX refills may take up to 3 business days. We ask that you follow-up with your pharmacy.

## 2021-06-28 MED ORDER — PRIMIDONE 50 MG PO TABS
50.0000 mg | ORAL_TABLET | Freq: Every day | ORAL | 1 refills | Status: DC
Start: 2021-06-28 — End: 2021-10-02

## 2021-06-28 NOTE — Telephone Encounter (Signed)
Refill sent.

## 2021-07-24 ENCOUNTER — Other Ambulatory Visit (INDEPENDENT_AMBULATORY_CARE_PROVIDER_SITE_OTHER): Payer: Medicare Other

## 2021-07-24 LAB — CBC
HCT: 43.7 % (ref 39.0–52.0)
Hemoglobin: 14.8 g/dL (ref 13.0–17.0)
MCHC: 33.9 g/dL (ref 30.0–36.0)
MCV: 96.4 fl (ref 78.0–100.0)
Platelets: 178 10*3/uL (ref 150.0–400.0)
RBC: 4.53 Mil/uL (ref 4.22–5.81)
RDW: 13.1 % (ref 11.5–15.5)
WBC: 5.6 10*3/uL (ref 4.0–10.5)

## 2021-07-24 LAB — FERRITIN: Ferritin: 19.4 ng/mL — ABNORMAL LOW (ref 22.0–322.0)

## 2021-07-26 ENCOUNTER — Other Ambulatory Visit: Payer: Self-pay

## 2021-08-28 ENCOUNTER — Telehealth: Payer: Self-pay | Admitting: Internal Medicine

## 2021-08-28 NOTE — Telephone Encounter (Signed)
Pt made aware of Dr. Gessner recommendations: Pt verbalized understanding with all questions answered.   

## 2021-08-28 NOTE — Telephone Encounter (Signed)
Pt states that he usually receives a phone call a week or two after his phlebotomy regarding orders for future phlebotomy.  Pt states that he never received any phone call.  Please advise

## 2021-08-28 NOTE — Telephone Encounter (Signed)
Inbound call from pt requesting a call back stating that he never received a call back regarding his phlebotomy. Please advise. Thank you.

## 2021-08-28 NOTE — Telephone Encounter (Signed)
Let him know that we are in process of changing the location of the phlebotomy and that is why we have not called.  I have the [employer] forms on my desk but am a bit uncertain as to how the process will work and once I know more will advise. I anticipate repeating at same interval.

## 2021-09-01 ENCOUNTER — Other Ambulatory Visit: Payer: Self-pay

## 2021-09-01 ENCOUNTER — Telehealth: Payer: Self-pay | Admitting: Internal Medicine

## 2021-09-01 NOTE — Telephone Encounter (Signed)
Called pt and informed about lab's decision to forego therapeutic draws for hemochromatosis. Also advised about n/o's for pt to be seen by Dr. Marin Olp for hemochromatosis. Pt is aware of referral scheduling process and verbalized acceptance and understanding of all information provided. Amb referral placed for pt to be seen by hem/onc.

## 2021-09-01 NOTE — Telephone Encounter (Signed)
Please contact and explained to patient that our lab is no longer performing therapeutic phlebotomy.  Thus, I cannot manage his hemochromatosis anymore.  We need to refer him to hematology,  Lets refer to Dr. Antonieta Pert office which is in the same building as his primary care

## 2021-09-25 ENCOUNTER — Inpatient Hospital Stay: Payer: Medicare Other | Attending: Hematology & Oncology

## 2021-09-25 ENCOUNTER — Other Ambulatory Visit: Payer: Self-pay | Admitting: *Deleted

## 2021-09-25 ENCOUNTER — Inpatient Hospital Stay (HOSPITAL_BASED_OUTPATIENT_CLINIC_OR_DEPARTMENT_OTHER): Payer: Medicare Other | Admitting: Hematology & Oncology

## 2021-09-25 ENCOUNTER — Inpatient Hospital Stay: Payer: Medicare Other

## 2021-09-25 ENCOUNTER — Other Ambulatory Visit: Payer: Self-pay

## 2021-09-25 ENCOUNTER — Encounter: Payer: Self-pay | Admitting: Hematology & Oncology

## 2021-09-25 DIAGNOSIS — Z7189 Other specified counseling: Secondary | ICD-10-CM | POA: Diagnosis not present

## 2021-09-25 HISTORY — DX: Other specified counseling: Z71.89

## 2021-09-25 LAB — CBC WITH DIFFERENTIAL (CANCER CENTER ONLY)
Abs Immature Granulocytes: 0.02 10*3/uL (ref 0.00–0.07)
Basophils Absolute: 0 10*3/uL (ref 0.0–0.1)
Basophils Relative: 1 %
Eosinophils Absolute: 0.2 10*3/uL (ref 0.0–0.5)
Eosinophils Relative: 4 %
HCT: 46.8 % (ref 39.0–52.0)
Hemoglobin: 15.6 g/dL (ref 13.0–17.0)
Immature Granulocytes: 0 %
Lymphocytes Relative: 18 %
Lymphs Abs: 1.1 10*3/uL (ref 0.7–4.0)
MCH: 32.1 pg (ref 26.0–34.0)
MCHC: 33.3 g/dL (ref 30.0–36.0)
MCV: 96.3 fL (ref 80.0–100.0)
Monocytes Absolute: 0.9 10*3/uL (ref 0.1–1.0)
Monocytes Relative: 14 %
Neutro Abs: 4 10*3/uL (ref 1.7–7.7)
Neutrophils Relative %: 63 %
Platelet Count: 167 10*3/uL (ref 150–400)
RBC: 4.86 MIL/uL (ref 4.22–5.81)
RDW: 12.7 % (ref 11.5–15.5)
WBC Count: 6.2 10*3/uL (ref 4.0–10.5)
nRBC: 0 % (ref 0.0–0.2)

## 2021-09-25 LAB — COMPREHENSIVE METABOLIC PANEL
ALT: 12 U/L (ref 0–44)
AST: 14 U/L — ABNORMAL LOW (ref 15–41)
Albumin: 4.3 g/dL (ref 3.5–5.0)
Alkaline Phosphatase: 64 U/L (ref 38–126)
Anion gap: 7 (ref 5–15)
BUN: 19 mg/dL (ref 8–23)
CO2: 31 mmol/L (ref 22–32)
Calcium: 9.6 mg/dL (ref 8.9–10.3)
Chloride: 101 mmol/L (ref 98–111)
Creatinine, Ser: 1.52 mg/dL — ABNORMAL HIGH (ref 0.61–1.24)
GFR, Estimated: 45 mL/min — ABNORMAL LOW (ref >60)
Glucose, Bld: 92 mg/dL (ref 70–99)
Potassium: 4.2 mmol/L (ref 3.5–5.1)
Sodium: 139 mmol/L (ref 135–145)
Total Bilirubin: 0.5 mg/dL (ref 0.3–1.2)
Total Protein: 6.7 g/dL (ref 6.5–8.1)

## 2021-09-25 NOTE — Progress Notes (Signed)
Referral MD  Reason for Referral: Hereditary hemochromatosis  Chief Complaint  Patient presents with   New Patient (Initial Visit)  : I can no longer do phlebotomies my gastroenterologist  HPI: Mr. Mashek is a really nice 80 year old white male.  He was originally from [place].  [place].  He moved out here for the weather.  He was found to have hemochromatosis up in [place].  This was 30 years ago.  He had a liver biopsy.  At the time when he was first diagnosed, I suppose it was kind of genetic test for hemochromatosis.  He has been getting phlebotomies every couple months.  Apparently, these have been stopped by the gastroenterologist as they cannot do that any longer in his office.  He was subsequently referred to the [facility].  He feels well.  He started having phlebotomies every 2 months.  His last ferritin was 19.4 back in October.  I really think he could probably donate blood to the [facility].  I think his blood is fine for donation.  Used to smoke.  He stopped I think 6 years ago.  He has no obvious occupational exposures.  He is not aware of anybody in the family with hemochromatosis.  He has had no cardiac issues.  He has only had 1 surgery that was inguinal hernia surgery.  He has had no problems with bleeding.  He has had no issues with his prostate.  Overall, I would say his performance status is probably ECOG 0.    Past Medical History:  Diagnosis Date   Allergy    Anal fissure - posterior 10/10/2016   Colon polyps    Tubular Adenoma and Hyperplastic    COPD (chronic obstructive pulmonary disease) ([place])    Epididymitis 01/03/2015   Hemochromatosis    HX OF   Inflamed seborrheic keratosis    Sinusitis, acute maxillary 04/29/2015   URI (upper respiratory infection)   :   Past Surgical History:  Procedure Laterality Date   APPENDECTOMY  1950's   COLONOSCOPY W/ BIOPSIES  10/20/2008   X 2    HEMORRHOID BANDING     INGUINAL HERNIA REPAIR Right 02/01/2015   INGUINAL HERNIA REPAIR N/A 02/01/2015   Procedure: LAPAROSCOPIC RIGHT INGUINAL HERNIA REPAIR;  Surgeon: Doreen Salvage, MD;  Location: [facility];  Service: General;  Laterality: N/A;   INSERTION OF MESH Right 02/01/2015   Procedure: INSERTION OF MESH;  Surgeon: Doreen Salvage, MD;  Location: [facility];  Service: General;  Laterality: Right;   TONSILLECTOMY  1950's  :   Current Outpatient Medications:    AMBULATORY NON FORMULARY MEDICATION, Lidocaine 5% mixed with Diltiazem gel 2% : Apply rectal twice a day as needed., Disp: 30 g, Rfl: 1   primidone (MYSOLINE) 50 MG tablet, Take 1 tablet (50 mg total) by mouth at bedtime., Disp: 90 tablet, Rfl: 1:  :  No Known Allergies:   Family History  Problem Relation Age of Onset   Colon cancer Neg Hx    Pancreatic cancer Neg Hx    Rectal cancer Neg Hx    Stomach cancer Neg Hx    Esophageal cancer Neg Hx    Liver cancer Neg Hx   :   Social History   Socioeconomic History   Marital status: Divorced    Spouse name: Not on file   Number of children: 1   Years of education: Not on file   Highest education  level: Not on file  Occupational History   Occupation: RETIRED    Employer: RETIRED  Tobacco Use   Smoking status: Former    Packs/day: 1.00    Years: 50.00    Pack years: 50.00    Types: Cigarettes    Quit date: 08/14/2009    Years since quitting: 12.1   Smokeless tobacco: Never   Tobacco comments:    Counseled to remain smoke free.  Vaping Use   Vaping Use: Never used  Substance and Sexual Activity   Alcohol use: Yes    Alcohol/week: 2.0 standard drinks    Types: 2 Standard drinks or equivalent per week    Comment: 02/01/2015 rare (one time per month)   Drug use: No   Sexual activity: Never    Partners: Female  Other Topics Concern   Not on file  Social History Narrative   Exercise-- , except golf, pickle ball    Live with girlfriend   Social Determinants of Health    Financial Resource Strain: Not on file  Food Insecurity: Not on file  Transportation Needs: Not on file  Physical Activity: Not on file  Stress: Not on file  Social Connections: Not on file  Intimate Partner Violence: Not on file  :  Review of Systems  Constitutional: Negative.   HENT: Negative.    Eyes: Negative.   Respiratory: Negative.    Cardiovascular: Negative.   Gastrointestinal: Negative.   Genitourinary: Negative.   Musculoskeletal: Negative.   Skin: Negative.   Neurological: Negative.   Endo/Heme/Allergies: Negative.   Psychiatric/Behavioral: Negative.      Exam: @IPVITALS @ Physical Exam Vitals reviewed.  HENT:     Head: Normocephalic and atraumatic.  Eyes:     Pupils: Pupils are equal, round, and reactive to light.  Cardiovascular:     Rate and Rhythm: Normal rate and regular rhythm.     Heart sounds: Normal heart sounds.  Pulmonary:     Effort: Pulmonary effort is normal.     Breath sounds: Normal breath sounds.  Abdominal:     General: Bowel sounds are normal.     Palpations: Abdomen is soft.  Musculoskeletal:        General: No tenderness or deformity. Normal range of motion.     Cervical back: Normal range of motion.  Lymphadenopathy:     Cervical: No cervical adenopathy.  Skin:    General: Skin is warm and dry.     Findings: No erythema or rash.  Neurological:     Mental Status: He is alert and oriented to person, place, and time.  Psychiatric:        Behavior: Behavior normal.        Thought Content: Thought content normal.        Judgment: Judgment normal.    Recent Labs    09/25/21 1353  WBC 6.2  HGB 15.6  HCT 46.8  PLT 167    Recent Labs    09/25/21 1353  NA 139  K 4.2  CL 101  CO2 31  GLUCOSE 92  BUN 19  CREATININE 1.52*  CALCIUM 9.6    Blood smear review: None  Pathology: None    Assessment and Plan: Mr. Pickelsimer is a very nice 80 year old white male.  I absolutely talking to him about baseball.  He is a big  baseball guy.  We had a long talk about the [facility]  Who think that we can make life a lot easier for him.  I  really do not think he needs to phlebotomize every 2 months.  We will see what his iron studies show.  I would need to see what his iron saturation is.  I really think that again, he can be donating blood to the [facility].  His blood is perfect.  There is no reason that his blood could not be used for donations.  I think that donating blood every 4 months would be a reasonable way to go.  Since he is here, we will go ahead and do a phlebotomy.  I really think that we can probably get him back to see [name] in about 8 months or so.  I really do not think that we have to do any blood work on him as long as he is donating blood to the [facility].

## 2021-09-25 NOTE — Progress Notes (Signed)
Unsuccessful phlebotomy attempt to right ac via phlebotomy kit.  Pt states that he does not want to be stuck again today and will f/u with one blood as ordered by Dr. Marin Olp.

## 2021-09-26 ENCOUNTER — Telehealth: Payer: Self-pay

## 2021-09-26 LAB — IRON AND TIBC
Iron: 224 ug/dL — ABNORMAL HIGH (ref 42–163)
Saturation Ratios: 85 % — ABNORMAL HIGH (ref 20–55)
TIBC: 263 ug/dL (ref 202–409)
UIBC: 38 ug/dL — ABNORMAL LOW (ref 117–376)

## 2021-09-26 LAB — FERRITIN: Ferritin: 27 ng/mL (ref 24–336)

## 2021-09-26 NOTE — Telephone Encounter (Signed)
Called and informed patient of lab results, patient verbalized understanding and denies any questions or concerns at this time. Repeat blood donations every 4 months per Dr Marin Olp.

## 2021-09-26 NOTE — Telephone Encounter (Signed)
-----   Message from Volanda Napoleon, MD sent at 09/26/2021  2:29 PM EST ----- Call and let him know that her ferritin is only 27.  This is okay.  He needs to donate blood to the [facility].  I think this was certainly quite helpful for him.  Laurey Arrow

## 2021-10-02 ENCOUNTER — Encounter: Payer: Self-pay | Admitting: Family Medicine

## 2021-10-02 ENCOUNTER — Ambulatory Visit (INDEPENDENT_AMBULATORY_CARE_PROVIDER_SITE_OTHER): Payer: Medicare Other | Admitting: Family Medicine

## 2021-10-02 VITALS — BP 134/78 | HR 66 | Temp 97.7°F | Resp 18 | Ht 72.0 in | Wt 198.8 lb

## 2021-10-02 DIAGNOSIS — Z0000 Encounter for general adult medical examination without abnormal findings: Secondary | ICD-10-CM | POA: Diagnosis not present

## 2021-10-02 DIAGNOSIS — G250 Essential tremor: Secondary | ICD-10-CM | POA: Diagnosis not present

## 2021-10-02 DIAGNOSIS — Z136 Encounter for screening for cardiovascular disorders: Secondary | ICD-10-CM | POA: Diagnosis not present

## 2021-10-02 LAB — COMPREHENSIVE METABOLIC PANEL
ALT: 10 U/L (ref 0–53)
AST: 12 U/L (ref 0–37)
Albumin: 3.9 g/dL (ref 3.5–5.2)
Alkaline Phosphatase: 58 U/L (ref 39–117)
BUN: 20 mg/dL (ref 6–23)
CO2: 32 mEq/L (ref 19–32)
Calcium: 9.2 mg/dL (ref 8.4–10.5)
Chloride: 104 mEq/L (ref 96–112)
Creatinine, Ser: 1.5 mg/dL (ref 0.40–1.50)
GFR: 42.98 mL/min — ABNORMAL LOW (ref >60.00)
Glucose, Bld: 87 mg/dL (ref 70–99)
Potassium: 4.6 mEq/L (ref 3.5–5.1)
Sodium: 140 mEq/L (ref 135–145)
Total Bilirubin: 0.6 mg/dL (ref 0.2–1.2)
Total Protein: 6.1 g/dL (ref 6.0–8.3)

## 2021-10-02 LAB — LIPID PANEL
Cholesterol: 164 mg/dL (ref 0–200)
HDL: 59 mg/dL (ref >39.00)
LDL Cholesterol: 76 mg/dL (ref 0–99)
NonHDL: 104.99
Total CHOL/HDL Ratio: 3
Triglycerides: 144 mg/dL (ref 0.0–149.0)
VLDL: 28.8 mg/dL (ref 0.0–40.0)

## 2021-10-02 LAB — CBC WITH DIFFERENTIAL/PLATELET
Basophils Absolute: 0 10*3/uL (ref 0.0–0.1)
Basophils Relative: 0.5 % (ref 0.0–3.0)
Eosinophils Absolute: 0.2 10*3/uL (ref 0.0–0.7)
Eosinophils Relative: 3.6 % (ref 0.0–5.0)
HCT: 46.1 % (ref 39.0–52.0)
Hemoglobin: 15.1 g/dL (ref 13.0–17.0)
Lymphocytes Relative: 14.5 % (ref 12.0–46.0)
Lymphs Abs: 0.8 10*3/uL (ref 0.7–4.0)
MCHC: 32.7 g/dL (ref 30.0–36.0)
MCV: 96.5 fl (ref 78.0–100.0)
Monocytes Absolute: 0.7 10*3/uL (ref 0.1–1.0)
Monocytes Relative: 13.7 % — ABNORMAL HIGH (ref 3.0–12.0)
Neutro Abs: 3.5 10*3/uL (ref 1.4–7.7)
Neutrophils Relative %: 67.7 % (ref 43.0–77.0)
Platelets: 157 10*3/uL (ref 150.0–400.0)
RBC: 4.77 Mil/uL (ref 4.22–5.81)
RDW: 13.6 % (ref 11.5–15.5)
WBC: 5.2 10*3/uL (ref 4.0–10.5)

## 2021-10-02 LAB — PSA: PSA: 1.12 ng/mL (ref 0.10–4.00)

## 2021-10-02 MED ORDER — PRIMIDONE 50 MG PO TABS: 50.0000 mg | ORAL_TABLET | Freq: Every day | ORAL | 1 refills | Status: DC

## 2021-10-02 NOTE — Patient Instructions (Signed)
Preventive Care 65 Years and Older, Male °Preventive care refers to lifestyle choices and visits with your health care provider that can promote health and wellness. Preventive care visits are also called wellness exams. °What can I expect for my preventive care visit? °Counseling °During your preventive care visit, your health care provider may ask about your: °Medical history, including: °Past medical problems. °Family medical history. °History of falls. °Current health, including: °Emotional well-being. °Home life and relationship well-being. °Sexual activity. °Memory and ability to understand (cognition). °Lifestyle, including: °Alcohol, nicotine or tobacco, and drug use. °Access to firearms. °Diet, exercise, and sleep habits. °Work and work environment. °Sunscreen use. °Safety issues such as seatbelt and bike helmet use. °Physical exam °Your health care provider will check your: °Height and weight. These may be used to calculate your BMI (body mass index). BMI is a measurement that tells if you are at a healthy weight. °Waist circumference. This measures the distance around your waistline. This measurement also tells if you are at a healthy weight and may help predict your risk of certain diseases, such as type 2 diabetes and high blood pressure. °Heart rate and blood pressure. °Body temperature. °Skin for abnormal spots. °What immunizations do I need? °Vaccines are usually given at various ages, according to a schedule. Your health care provider will recommend vaccines for you based on your age, medical history, and lifestyle or other factors, such as travel or where you work. °What tests do I need? °Screening °Your health care provider may recommend screening tests for certain conditions. This may include: °Lipid and cholesterol levels. °Diabetes screening. This is done by checking your blood sugar (glucose) after you have not eaten for a while (fasting). °Hepatitis C test. °Hepatitis B test. °HIV (human  immunodeficiency virus) test. °STI (sexually transmitted infection) testing, if you are at risk. °Lung cancer screening. °Colorectal cancer screening. °Prostate cancer screening. °Abdominal aortic aneurysm (AAA) screening. You may need this if you are a current or former smoker. °Talk with your health care provider about your test results, treatment options, and if necessary, the need for more tests. °Follow these instructions at home: °Eating and drinking ° °Eat a diet that includes fresh fruits and vegetables, whole grains, lean protein, and low-fat dairy products. Limit your intake of foods with high amounts of sugar, saturated fats, and salt. °Take vitamin and mineral supplements as recommended by your health care provider. °Do not drink alcohol if your health care provider tells you not to drink. °If you drink alcohol: °Limit how much you have to 0-2 drinks a day. °Know how much alcohol is in your drink. In the [place] one drink equals one 12 oz bottle of beer (355 mL), one 5 oz glass of wine (148 mL), or one 1½ oz glass of hard liquor (44 mL). °Lifestyle °Brush your teeth every morning and night with fluoride toothpaste. Floss one time each day. °Exercise for at least 30 minutes 5 or more days each week. °Do not use any products that contain nicotine or tobacco. These products include cigarettes, chewing tobacco, and vaping devices, such as e-cigarettes. If you need help quitting, ask your health care provider. °Do not use drugs. °If you are sexually active, practice safe sex. Use a condom or other form of protection to prevent STIs. °Take aspirin only as told by your health care provider. Make sure that you understand how much to take and what form to take. Work with your health care provider to find out whether it is safe and   beneficial for you to take aspirin daily. °Ask your health care provider if you need to take a cholesterol-lowering medicine (statin). °Find healthy ways to manage stress, such  as: °Meditation, yoga, or listening to music. °Journaling. °Talking to a trusted person. °Spending time with friends and family. °Safety °Always wear your seat belt while driving or riding in a vehicle. °Do not drive: °If you have been drinking alcohol. Do not ride with someone who has been drinking. °When you are tired or distracted. °While texting. °If you have been using any mind-altering substances or drugs. °Wear a helmet and other protective equipment during sports activities. °If you have firearms in your house, make sure you follow all gun safety procedures. °Minimize exposure to UV radiation to reduce your risk of skin cancer. °What's next? °Visit your health care provider once a year for an annual wellness visit. °Ask your health care provider how often you should have your eyes and teeth checked. °Stay up to date on all vaccines. °This information is not intended to replace advice given to you by your health care provider. Make sure you discuss any questions you have with your health care provider. °Document Revised: 04/05/2021 Document Reviewed: 04/05/2021 °Elsevier Patient Education © 2022 [employer] ° °

## 2021-10-02 NOTE — Assessment & Plan Note (Addendum)
ghm utd Check labs  See avs Pt does not want pneum, shingles or covid vaccine  We discussed tetanus--- he would need to get that at the pharmacy

## 2021-10-02 NOTE — Progress Notes (Signed)
Subjective:   By signing my name below, I, Zite Okoli, attest that this documentation has been prepared under the direction and in the presence of Ann Held, DO. 10/02/2021   Patient ID: Devin Schmidt, male    DOB: [birth year], 80 y.o.   MRN: [id number]  Chief Complaint  Patient presents with   Annual Exam    Pt states fasting     HPI Patient is in today for a comprehensive physical exam.  He denies fever, hearing loss, ear pain,congestion, sinus pain, sore throat, eye pain, chest pain, palpitations, cough, shortness of breath, wheezing, nausea. vomiting, diarrhea, constipation, blood in stool, dysuria,frequency, hematuria and headaches.   He has received the flu vaccine. He has 4 Pfizer Covid-19 vaccines at this time. He is not interested in the pneumonia or shingles vaccine.  He has stopped playing pickle ball because it became too exerting. He exercises by walking and playing golf in the summer.  He uses hearing aids.  He has no recent surgeries. There has been no changes in his family history.   Past Medical History:  Diagnosis Date   Allergy    Anal fissure - posterior 10/10/2016   Colon polyps    Tubular Adenoma and Hyperplastic    COPD (chronic obstructive pulmonary disease) ([place])    Epididymitis 01/03/2015   Goals of care, counseling/discussion 09/25/2021   Hemochromatosis    HX OF   Inflamed seborrheic keratosis    Sinusitis, acute maxillary 04/29/2015   URI (upper respiratory infection)     Past Surgical History:  Procedure Laterality Date   APPENDECTOMY  1950's   COLONOSCOPY W/ BIOPSIES  10/20/2008   X 2   HEMORRHOID BANDING     INGUINAL HERNIA REPAIR Right 02/01/2015   INGUINAL HERNIA REPAIR N/A 02/01/2015   Procedure: LAPAROSCOPIC RIGHT INGUINAL HERNIA REPAIR;  Surgeon: Doreen Salvage, MD;  Location: [facility];  Service: General;  Laterality: N/A;   INSERTION OF MESH Right 02/01/2015   Procedure: INSERTION OF MESH;  Surgeon: Doreen Salvage, MD;  Location: [facility];  Service: General;  Laterality: Right;   TONSILLECTOMY  1950's    Family History  Problem Relation Age of Onset   Colon cancer Neg Hx    Pancreatic cancer Neg Hx    Rectal cancer Neg Hx    Stomach cancer Neg Hx    Esophageal cancer Neg Hx    Liver cancer Neg Hx     Social History   Socioeconomic History   Marital status: Divorced    Spouse name: Not on file   Number of children: 1   Years of education: Not on file   Highest education level: Not on file  Occupational History   Occupation: RETIRED    Employer: RETIRED  Tobacco Use   Smoking status: Former    Packs/day: 1.00    Years: 50.00    Pack years: 50.00    Types: Cigarettes    Quit date: 08/14/2009    Years since quitting: 12.1   Smokeless tobacco: Never   Tobacco comments:    Counseled to remain smoke free.  Vaping Use   Vaping Use: Never used  Substance and Sexual Activity   Alcohol use: Yes    Alcohol/week: 2.0 standard drinks    Types: 2 Standard drinks or equivalent per week    Comment: 02/01/2015 rare (one time per month)   Drug use: No   Sexual activity: Never    Partners: Female  Other Topics Concern  Not on file  Social History Narrative   Exercise-- , except golf, walking    Live with girlfriend   Social Determinants of Health   Financial Resource Strain: Not on file  Food Insecurity: Not on file  Transportation Needs: Not on file  Physical Activity: Not on file  Stress: Not on file  Social Connections: Not on file  Intimate Partner Violence: Not on file    Outpatient Medications Prior to Visit  Medication Sig Dispense Refill   AMBULATORY NON FORMULARY MEDICATION Lidocaine 5% mixed with Diltiazem gel 2% : Apply rectal twice a day as needed. 30 g 1   primidone (MYSOLINE) 50 MG tablet Take 1 tablet (50 mg total) by mouth at bedtime. 90 tablet 1   No facility-administered medications prior to visit.    No Known Allergies  Review of Systems  Constitutional:  Negative for fever.   HENT:  Negative for congestion, ear pain, hearing loss, sinus pain and sore throat.   Eyes:  Negative for blurred vision and pain.  Respiratory:  Negative for cough, sputum production, shortness of breath and wheezing.   Cardiovascular:  Negative for chest pain and palpitations.  Gastrointestinal:  Negative for blood in stool, constipation, diarrhea, nausea and vomiting.  Genitourinary:  Negative for dysuria, frequency, hematuria and urgency.  Musculoskeletal:  Negative for back pain, falls and myalgias.  Neurological:  Negative for dizziness, sensory change, loss of consciousness, weakness and headaches.  Endo/Heme/Allergies:  Negative for environmental allergies. Does not bruise/bleed easily.  Psychiatric/Behavioral:  Negative for depression and suicidal ideas. The patient is not nervous/anxious and does not have insomnia.       Objective:    Physical Exam Constitutional:      General: He is not in acute distress.    Appearance: Normal appearance. He is not ill-appearing.  HENT:     Head: Normocephalic and atraumatic.     Right Ear: Tympanic membrane, ear canal and external ear normal.     Left Ear: Tympanic membrane, ear canal and external ear normal.  Eyes:     Pupils: Pupils are equal, round, and reactive to light.  Cardiovascular:     Rate and Rhythm: Normal rate and regular rhythm.     Pulses: Normal pulses.     Heart sounds: No murmur heard.   No gallop.  Pulmonary:     Effort: Pulmonary effort is normal. No respiratory distress.     Breath sounds: Normal breath sounds. No wheezing or rhonchi.  Abdominal:     General: Bowel sounds are normal. There is no distension.     Palpations: Abdomen is soft.     Tenderness: There is no abdominal tenderness. There is no guarding.     Hernia: No hernia is present.  Musculoskeletal:     Cervical back: Neck supple.  Lymphadenopathy:     Cervical: No cervical adenopathy.  Skin:    General: Skin is warm and dry.  Neurological:      Mental Status: He is alert and oriented to person, place, and time.    BP 134/78 (BP Location: Left Arm, Patient Position: Sitting, Cuff Size: Normal)   Pulse 66   Temp 97.7 F (36.5 C) (Oral)   Resp 18   Ht 6' (1.829 m)   Wt 198 lb 12.8 oz (90.2 kg)   SpO2 97%   BMI 26.96 kg/m  Wt Readings from Last 3 Encounters:  10/02/21 198 lb 12.8 oz (90.2 kg)  09/25/21 201 lb (91.2 kg)  09/19/20  193 lb 3.2 oz (87.6 kg)    Diabetic Foot Exam - Simple   No data filed    Lab Results  Component Value Date   WBC 6.2 09/25/2021   HGB 15.6 09/25/2021   HCT 46.8 09/25/2021   PLT 167 09/25/2021   GLUCOSE 92 09/25/2021   CHOL 172 09/19/2020   TRIG 137.0 09/19/2020   HDL 58.20 09/19/2020   LDLDIRECT 72.0 09/14/2019   LDLCALC 86 09/19/2020   ALT 12 09/25/2021   AST 14 (L) 09/25/2021   NA 139 09/25/2021   K 4.2 09/25/2021   CL 101 09/25/2021   CREATININE 1.52 (H) 09/25/2021   BUN 19 09/25/2021   CO2 31 09/25/2021   TSH 0.60 11/20/2012   PSA 1.34 09/19/2020    Lab Results  Component Value Date   TSH 0.60 11/20/2012   Lab Results  Component Value Date   WBC 6.2 09/25/2021   HGB 15.6 09/25/2021   HCT 46.8 09/25/2021   MCV 96.3 09/25/2021   PLT 167 09/25/2021   Lab Results  Component Value Date   NA 139 09/25/2021   K 4.2 09/25/2021   CO2 31 09/25/2021   GLUCOSE 92 09/25/2021   BUN 19 09/25/2021   CREATININE 1.52 (H) 09/25/2021   BILITOT 0.5 09/25/2021   ALKPHOS 64 09/25/2021   AST 14 (L) 09/25/2021   ALT 12 09/25/2021   PROT 6.7 09/25/2021   ALBUMIN 4.3 09/25/2021   CALCIUM 9.6 09/25/2021   ANIONGAP 7 09/25/2021   GFR 46.24 (L) 09/19/2020   Lab Results  Component Value Date   CHOL 172 09/19/2020   Lab Results  Component Value Date   HDL 58.20 09/19/2020   Lab Results  Component Value Date   LDLCALC 86 09/19/2020   Lab Results  Component Value Date   TRIG 137.0 09/19/2020   Lab Results  Component Value Date   CHOLHDL 3 09/19/2020   No results  found for: HGBA1C      Colonoscopy: Last completed on 03/13/2018. There were internal hemorrhoids and a small lipoma in the ascending colon. No repeat colonoscopy. Assessment & Plan:   Problem List Items Addressed This Visit       Unprioritized   Ischemic heart disease screen   Relevant Orders   PSA   CBC with Differential/Platelet   Comprehensive metabolic panel   Lipid panel   Essential tremor    Stable con't mysoline      Relevant Medications   primidone (MYSOLINE) 50 MG tablet   Other Relevant Orders   PSA   CBC with Differential/Platelet   Comprehensive metabolic panel   Lipid panel   Preventative health care - Primary    ghm utd Check labs  See avs Pt does not want pneum, shingles or covid vaccine  We discussed tetanus--- he would need to get that at the pharmacy        Meds ordered this encounter  Medications   primidone (MYSOLINE) 50 MG tablet    Sig: Take 1 tablet (50 mg total) by mouth at bedtime.    Dispense:  90 tablet    Refill:  1    I,Zite Okoli,acting as a Education administrator for [employer], DO.,have documented all relevant documentation on the behalf of Ann Held, DO,as directed by  Ann Held, DO while in the presence of Ann Held, DO.   I, Ann Held, DO., personally preformed the services described in this documentation.  All medical record entries made by the scribe were at my direction and in my presence.  I have reviewed the chart and discharge instructions (if applicable) and agree that the record reflects my personal performance and is accurate and complete. 10/02/2021

## 2021-10-02 NOTE — Assessment & Plan Note (Signed)
Stable con't mysoline

## 2022-01-23 ENCOUNTER — Telehealth: Payer: Self-pay | Admitting: Family Medicine

## 2022-01-23 NOTE — Telephone Encounter (Signed)
Tried calling patient to schedule Medicare Annual Wellness Visit (AWV) either virtually or phone ? ?No answer phone kept disconnecting ? ? ?Last AWV 09/20/14 ?please schedule at anytime with health coach ? ? ?

## 2022-04-02 ENCOUNTER — Telehealth: Payer: Self-pay | Admitting: Family Medicine

## 2022-04-02 DIAGNOSIS — G250 Essential tremor: Secondary | ICD-10-CM

## 2022-04-02 NOTE — Telephone Encounter (Signed)
Medication: primidone (MYSOLINE) 50 MG tablet   Has the patient contacted their pharmacy? No. (If no, request that the patient contact the pharmacy for the refill.) (If yes, when and what did the pharmacy advise?)     Preferred Pharmacy (with phone number or street name): [facility], [place].  [address] [place] [address]  Phone:  [phone]  Fax:  [phone]

## 2022-04-03 MED ORDER — PRIMIDONE 50 MG PO TABS: 50.0000 mg | ORAL_TABLET | Freq: Every day | ORAL | 1 refills | Status: DC

## 2022-04-03 NOTE — Telephone Encounter (Signed)
Refills sent

## 2022-05-23 ENCOUNTER — Encounter: Payer: Self-pay | Admitting: Hematology & Oncology

## 2022-05-23 ENCOUNTER — Inpatient Hospital Stay: Payer: Medicare Other | Attending: Hematology & Oncology

## 2022-05-23 ENCOUNTER — Other Ambulatory Visit: Payer: Self-pay

## 2022-05-23 ENCOUNTER — Inpatient Hospital Stay: Payer: Medicare Other | Admitting: Hematology & Oncology

## 2022-05-23 DIAGNOSIS — Z79899 Other long term (current) drug therapy: Secondary | ICD-10-CM | POA: Insufficient documentation

## 2022-05-23 LAB — CBC WITH DIFFERENTIAL (CANCER CENTER ONLY)
Abs Immature Granulocytes: 0.01 10*3/uL (ref 0.00–0.07)
Basophils Absolute: 0 10*3/uL (ref 0.0–0.1)
Basophils Relative: 0 %
Eosinophils Absolute: 0.3 10*3/uL (ref 0.0–0.5)
Eosinophils Relative: 5 %
HCT: 47.2 % (ref 39.0–52.0)
Hemoglobin: 16.1 g/dL (ref 13.0–17.0)
Immature Granulocytes: 0 %
Lymphocytes Relative: 15 %
Lymphs Abs: 0.9 10*3/uL (ref 0.7–4.0)
MCH: 32.9 pg (ref 26.0–34.0)
MCHC: 34.1 g/dL (ref 30.0–36.0)
MCV: 96.3 fL (ref 80.0–100.0)
Monocytes Absolute: 0.7 10*3/uL (ref 0.1–1.0)
Monocytes Relative: 11 %
Neutro Abs: 4.2 10*3/uL (ref 1.7–7.7)
Neutrophils Relative %: 69 %
Platelet Count: 146 10*3/uL — ABNORMAL LOW (ref 150–400)
RBC: 4.9 MIL/uL (ref 4.22–5.81)
RDW: 12.9 % (ref 11.5–15.5)
WBC Count: 6.2 10*3/uL (ref 4.0–10.5)
nRBC: 0 % (ref 0.0–0.2)

## 2022-05-23 LAB — CMP (CANCER CENTER ONLY)
ALT: 11 U/L (ref 0–44)
AST: 10 U/L — ABNORMAL LOW (ref 15–41)
Albumin: 4.3 g/dL (ref 3.5–5.0)
Alkaline Phosphatase: 63 U/L (ref 38–126)
Anion gap: 7 (ref 5–15)
BUN: 23 mg/dL (ref 8–23)
CO2: 27 mmol/L (ref 22–32)
Calcium: 9.3 mg/dL (ref 8.9–10.3)
Chloride: 106 mmol/L (ref 98–111)
Creatinine: 1.59 mg/dL — ABNORMAL HIGH (ref 0.61–1.24)
GFR, Estimated: 43 mL/min — ABNORMAL LOW (ref >60)
Glucose, Bld: 111 mg/dL — ABNORMAL HIGH (ref 70–99)
Potassium: 4.1 mmol/L (ref 3.5–5.1)
Sodium: 140 mmol/L (ref 135–145)
Total Bilirubin: 0.5 mg/dL (ref 0.3–1.2)
Total Protein: 6.4 g/dL — ABNORMAL LOW (ref 6.5–8.1)

## 2022-05-23 LAB — FERRITIN: Ferritin: 26 ng/mL (ref 24–336)

## 2022-05-23 NOTE — Progress Notes (Addendum)
Hematology and Oncology Follow Up Visit  Devin Schmidt [id number] 15-Nov-1940 81 y.o. 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 --He donates blood to [facility] every 3-4 months.     Interim History:  Devin Schmidt is back for follow-up.  This is his second office visit.  We first saw him back in December of last year.  At that time, we arranged for him to have his blood donated to [facility].  He is on this twice already.  When we last saw him, his iron studies showed a ferritin of 27 with an iron saturation of 85%.  He is quite active.  He has had no complaints.  He is playing golf.  He has had no problems with nausea or vomiting.  He has had no problems with fever.  He has had no nausea or vomiting.  He has had no leg swelling.  He has had no rashes.  He has had no headache.  Overall, his performance status is ECOG 0.  Medications:  Current Outpatient Medications:    AMBULATORY NON FORMULARY MEDICATION, Lidocaine 5% mixed with Diltiazem gel 2% : Apply rectal twice a day as needed., Disp: 30 g, Rfl: 1   primidone (MYSOLINE) 50 MG tablet, Take 1 tablet (50 mg total) by mouth at bedtime., Disp: 90 tablet, Rfl: 1  Allergies: No Known Allergies  Past Medical History, Surgical history, Social history, and Family History were reviewed and updated.  Review of Systems: Review of Systems  Constitutional: Negative.   HENT:  Negative.    Eyes: Negative.   Respiratory: Negative.    Cardiovascular: Negative.   Gastrointestinal: Negative.   Endocrine: Negative.   Genitourinary: Negative.    Musculoskeletal: Negative.   Skin: Negative.   Neurological: Negative.   Hematological: Negative.   Psychiatric/Behavioral: Negative.      Physical Exam:  weight is 197 lb (89.4 kg). His oral temperature is 98 F (36.7 C). His blood pressure is  113/57 (abnormal) and his pulse is 64. His respiration is 18 and oxygen saturation is 97%.   Wt Readings from Last 3 Encounters:  05/23/22 197 lb (89.4 kg)  10/02/21 198 lb 12.8 oz (90.2 kg)  09/25/21 201 lb (91.2 kg)    Physical Exam Vitals reviewed.  HENT:     Head: Normocephalic and atraumatic.  Eyes:     Pupils: Pupils are equal, round, and reactive to light.  Cardiovascular:     Rate and Rhythm: Normal rate and regular rhythm.     Heart sounds: Normal heart sounds.  Pulmonary:     Effort: Pulmonary effort is normal.     Breath sounds: Normal breath sounds.  Abdominal:     General: Bowel sounds are normal.     Palpations: Abdomen is soft.  Musculoskeletal:        General: No tenderness or deformity. Normal range of motion.     Cervical back: Normal range of motion.  Lymphadenopathy:     Cervical: No cervical adenopathy.  Skin:    General: Skin is warm and dry.     Findings: No erythema or rash.  Neurological:     Mental Status: He is alert and oriented to person, place, and time.  Psychiatric:        Behavior: Behavior normal.        Thought Content: Thought content normal.  Judgment: Judgment normal.    Lab Results  Component Value Date   WBC 6.2 05/23/2022   HGB 16.1 05/23/2022   HCT 47.2 05/23/2022   MCV 96.3 05/23/2022   PLT 146 (L) 05/23/2022     Chemistry      Component Value Date/Time   NA 140 05/23/2022 1443   K 4.1 05/23/2022 1443   CL 106 05/23/2022 1443   CO2 27 05/23/2022 1443   BUN 23 05/23/2022 1443   CREATININE 1.59 (H) 05/23/2022 1443      Component Value Date/Time   CALCIUM 9.3 05/23/2022 1443   ALKPHOS 63 05/23/2022 1443   AST 10 (L) 05/23/2022 1443   ALT 11 05/23/2022 1443   BILITOT 0.5 05/23/2022 1443      Impression and Plan: Devin Schmidt is a very nice 81 year old white male.  He has hemochromatosis.  We are sending off the genetic assay to see what type of mutations he has.  He is donating blood to the community.   This I think it is really the best thing that he can do.  He probably have a little bit of iron avidity which is why the iron saturation was so high.  We will see what the iron saturation is at this time.  His quality life is doing great.  He is doing well.  We will still plan for follow-up in 6 months.  I think this would be reasonable.  If we have to get him back here for another or extra phlebotomy, we can certainly do this.   Volanda Napoleon, MD 8/2/20234:01 PM

## 2022-05-24 LAB — IRON AND IRON BINDING CAPACITY (CC-WL,HP ONLY)
Iron: 219 ug/dL — ABNORMAL HIGH (ref 45–182)
Saturation Ratios: 90 % — ABNORMAL HIGH (ref 17.9–39.5)
TIBC: 244 ug/dL — ABNORMAL LOW (ref 250–450)
UIBC: 25 ug/dL — ABNORMAL LOW (ref 117–376)

## 2022-05-25 ENCOUNTER — Telehealth: Payer: Self-pay

## 2022-05-25 NOTE — Telephone Encounter (Signed)
-----   Message from Volanda Napoleon, MD sent at 05/24/2022  4:51 PM EDT ----- Call and let him know that the iron levels are still okay.  The iron saturation is on the higher side this because there are not a lot of iron binding sites for the iron to find 2.  This is okay because the ferritin is only 26.  Please keep donating blood to 1 blood.  Thanks.  Laurey Arrow

## 2022-05-25 NOTE — Telephone Encounter (Signed)
Advised via MyChart.

## 2022-05-29 LAB — HEMOCHROMATOSIS DNA-PCR(C282Y,H63D)

## 2022-09-24 ENCOUNTER — Telehealth: Payer: Self-pay | Admitting: Family Medicine

## 2022-09-24 DIAGNOSIS — G250 Essential tremor: Secondary | ICD-10-CM

## 2022-09-24 MED ORDER — PRIMIDONE 50 MG PO TABS: 50.0000 mg | ORAL_TABLET | Freq: Every day | ORAL | 0 refills | Status: DC

## 2022-09-24 NOTE — Telephone Encounter (Signed)
Refill sent. Pt has appt on 10/27/22

## 2022-09-24 NOTE — Telephone Encounter (Signed)
Medication: primidone (MYSOLINE) 50 MG tablet  Has the patient contacted their pharmacy? No.   Preferred Pharmacy:    [place], [place]. [address] [place] [address] Phone: [phone]  Fax: [phone]

## 2022-10-05 ENCOUNTER — Encounter: Payer: Medicare Other | Admitting: Family Medicine

## 2022-11-06 ENCOUNTER — Encounter: Payer: Medicare Other | Admitting: Family Medicine

## 2022-12-12 ENCOUNTER — Inpatient Hospital Stay: Payer: Medicare Other | Admitting: Hematology & Oncology

## 2022-12-12 ENCOUNTER — Encounter: Payer: Self-pay | Admitting: Hematology & Oncology

## 2022-12-12 ENCOUNTER — Inpatient Hospital Stay: Payer: Medicare Other | Attending: Hematology & Oncology

## 2022-12-12 LAB — CBC WITH DIFFERENTIAL (CANCER CENTER ONLY)
Abs Immature Granulocytes: 0.02 10*3/uL (ref 0.00–0.07)
Basophils Absolute: 0 10*3/uL (ref 0.0–0.1)
Basophils Relative: 0 %
Eosinophils Absolute: 0.3 10*3/uL (ref 0.0–0.5)
Eosinophils Relative: 4 %
HCT: 47 % (ref 39.0–52.0)
Hemoglobin: 15.9 g/dL (ref 13.0–17.0)
Immature Granulocytes: 0 %
Lymphocytes Relative: 14 %
Lymphs Abs: 0.9 10*3/uL (ref 0.7–4.0)
MCH: 33.5 pg (ref 26.0–34.0)
MCHC: 33.8 g/dL (ref 30.0–36.0)
MCV: 99.2 fL (ref 80.0–100.0)
Monocytes Absolute: 0.9 10*3/uL (ref 0.1–1.0)
Monocytes Relative: 13 %
Neutro Abs: 4.5 10*3/uL (ref 1.7–7.7)
Neutrophils Relative %: 69 %
Platelet Count: 159 10*3/uL (ref 150–400)
RBC: 4.74 MIL/uL (ref 4.22–5.81)
RDW: 12.2 % (ref 11.5–15.5)
WBC Count: 6.6 10*3/uL (ref 4.0–10.5)
nRBC: 0 % (ref 0.0–0.2)

## 2022-12-12 LAB — CMP (CANCER CENTER ONLY)
ALT: 11 U/L (ref 0–44)
AST: 12 U/L — ABNORMAL LOW (ref 15–41)
Albumin: 4.3 g/dL (ref 3.5–5.0)
Alkaline Phosphatase: 65 U/L (ref 38–126)
Anion gap: 6 (ref 5–15)
BUN: 23 mg/dL (ref 8–23)
CO2: 29 mmol/L (ref 22–32)
Calcium: 9.5 mg/dL (ref 8.9–10.3)
Chloride: 105 mmol/L (ref 98–111)
Creatinine: 1.69 mg/dL — ABNORMAL HIGH (ref 0.61–1.24)
GFR, Estimated: 40 mL/min — ABNORMAL LOW (ref >60)
Glucose, Bld: 83 mg/dL (ref 70–99)
Potassium: 4.5 mmol/L (ref 3.5–5.1)
Sodium: 140 mmol/L (ref 135–145)
Total Bilirubin: 0.5 mg/dL (ref 0.3–1.2)
Total Protein: 6.7 g/dL (ref 6.5–8.1)

## 2022-12-12 LAB — FERRITIN: Ferritin: 35 ng/mL (ref 24–336)

## 2022-12-12 NOTE — Progress Notes (Signed)
Hematology and Oncology Follow Up Visit  Devin Schmidt [id number] 10-21-1941 82 y.o. 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 --He donates blood to [facility] every 3-4 months.     Interim History:  Mr. Devin Schmidt is back for follow-up.  We last saw him back in August.  Since then, he has been donating blood a little bit.  I think that we can by have him donate blood more often.  I told that he can donate blood every 3 months.  When we last saw him, his iron levels showed a ferritin of 26 with an iron saturation of 90%.  He is doing well.  He had no problems over the Holiday season.  He is playing golf when the weather gets warm.  He has had no change in bowel or bladder habits.  He has had no problems with COVID.  He has had no bleeding.  He has had no cough or shortness of breath.  He has had no leg swelling.  He has had no arthritic issues.  Overall, I would say that his performance status is probably ECOG 0.    Medications:  Current Outpatient Medications:    primidone (MYSOLINE) 50 MG tablet, Take 1 tablet (50 mg total) by mouth at bedtime., Disp: 90 tablet, Rfl: 0   AMBULATORY NON FORMULARY MEDICATION, Lidocaine 5% mixed with Diltiazem gel 2% : Apply rectal twice a day as needed. (Patient not taking: Reported on 12/12/2022), Disp: 30 g, Rfl: 1  Allergies: No Known Allergies  Past Medical History, Surgical history, Social history, and Family History were reviewed and updated.  Review of Systems: Review of Systems  Constitutional: Negative.   HENT:  Negative.    Eyes: Negative.   Respiratory: Negative.    Cardiovascular: Negative.   Gastrointestinal: Negative.   Endocrine: Negative.   Genitourinary: Negative.    Musculoskeletal: Negative.   Skin: Negative.   Neurological: Negative.   Hematological: Negative.    Psychiatric/Behavioral: Negative.      Physical Exam:  weight is 197 lb (89.4 kg). His oral temperature is 98.7 F (37.1 C). His blood pressure is 143/70 (abnormal) and his pulse is 66. His respiration is 18 and oxygen saturation is 98%.   Wt Readings from Last 3 Encounters:  12/12/22 197 lb (89.4 kg)  05/23/22 197 lb (89.4 kg)  10/02/21 198 lb 12.8 oz (90.2 kg)    Physical Exam Vitals reviewed.  HENT:     Head: Normocephalic and atraumatic.  Eyes:     Pupils: Pupils are equal, round, and reactive to light.  Cardiovascular:     Rate and Rhythm: Normal rate and regular rhythm.     Heart sounds: Normal heart sounds.  Pulmonary:     Effort: Pulmonary effort is normal.     Breath sounds: Normal breath sounds.  Abdominal:     General: Bowel sounds are normal.     Palpations: Abdomen is soft.  Musculoskeletal:        General: No tenderness or deformity. Normal range of motion.     Cervical back: Normal range of motion.  Lymphadenopathy:     Cervical: No cervical adenopathy.  Skin:    General: Skin is warm and dry.     Findings: No erythema or rash.  Neurological:     Mental Status: He is alert and oriented to person, place, and  time.  Psychiatric:        Behavior: Behavior normal.        Thought Content: Thought content normal.        Judgment: Judgment normal.   Lab Results  Component Value Date   WBC 6.6 12/12/2022   HGB 15.9 12/12/2022   HCT 47.0 12/12/2022   MCV 99.2 12/12/2022   PLT 159 12/12/2022     Chemistry      Component Value Date/Time   NA 140 12/12/2022 1457   K 4.5 12/12/2022 1457   CL 105 12/12/2022 1457   CO2 29 12/12/2022 1457   BUN 23 12/12/2022 1457   CREATININE 1.69 (H) 12/12/2022 1457      Component Value Date/Time   CALCIUM 9.5 12/12/2022 1457   ALKPHOS 65 12/12/2022 1457   AST 12 (L) 12/12/2022 1457   ALT 11 12/12/2022 1457   BILITOT 0.5 12/12/2022 1457      Impression and Plan: Mr. Devin Schmidt is a very nice 82 year old white male.   He has hemochromatosis.  He has a major mutation.  He is homozygous for this.  Again, we really need to be aggressive with given his iron saturation.  I told him that he can donate blood every 3 months.  Hopefully, he will go to [facility] in a week or so to donate blood.  Again I told him that he can go every 3 months.  We will see what his iron studies look like.  I still think that we can probably get him back to see [name] in another 6 months.    Volanda Napoleon, MD 2/21/20243:52 PM

## 2022-12-13 ENCOUNTER — Telehealth: Payer: Self-pay

## 2022-12-13 LAB — IRON AND IRON BINDING CAPACITY (CC-WL,HP ONLY)
Iron: 218 ug/dL — ABNORMAL HIGH (ref 45–182)
Saturation Ratios: 87 % — ABNORMAL HIGH (ref 17.9–39.5)
TIBC: 251 ug/dL (ref 250–450)
UIBC: 33 ug/dL — ABNORMAL LOW (ref 117–376)

## 2022-12-13 NOTE — Telephone Encounter (Signed)
Advised via MyChart.

## 2022-12-13 NOTE — Telephone Encounter (Signed)
-----   Message from Volanda Napoleon, MD sent at 12/13/2022 11:14 AM EST ----- Please call him and let him know that the iron saturation is on the high side.  He really needs to donate blood every 3 months.  Thanks.  Laurey Arrow

## 2022-12-24 ENCOUNTER — Telehealth: Payer: Self-pay | Admitting: Family Medicine

## 2022-12-24 DIAGNOSIS — G250 Essential tremor: Secondary | ICD-10-CM

## 2022-12-24 MED ORDER — PRIMIDONE 50 MG PO TABS: 50.0000 mg | ORAL_TABLET | Freq: Every day | ORAL | 0 refills | Status: DC

## 2022-12-24 NOTE — Telephone Encounter (Signed)
Prescription Request  12/24/2022  Is this a "Controlled Substance" medicine? No  LOV: Visit date not found  What is the name of the medication or equipment?   primidone (MYSOLINE) 50 MG tablet [id number]   Have you contacted your pharmacy to request a refill? No   Which pharmacy would you like this sent to?  [facility], [address] [place] [address] Phone: [phone] Fax: [phone]    Patient notified that their request is being sent to the clinical staff for review and that they should receive a response within 2 business days.   Please advise at Mobile There is no such number on file (mobile).

## 2022-12-24 NOTE — Telephone Encounter (Signed)
Refill sent.

## 2022-12-28 ENCOUNTER — Other Ambulatory Visit: Payer: Self-pay | Admitting: Family Medicine

## 2022-12-28 ENCOUNTER — Encounter: Payer: Self-pay | Admitting: Family Medicine

## 2022-12-28 DIAGNOSIS — Z87891 Personal history of nicotine dependence: Secondary | ICD-10-CM

## 2023-01-22 ENCOUNTER — Ambulatory Visit (INDEPENDENT_AMBULATORY_CARE_PROVIDER_SITE_OTHER): Payer: Medicare Other | Admitting: Family Medicine

## 2023-01-22 ENCOUNTER — Encounter: Payer: Self-pay | Admitting: Family Medicine

## 2023-01-22 VITALS — BP 110/70 | HR 59 | Temp 98.1°F | Resp 18 | Ht 72.0 in | Wt 194.0 lb

## 2023-01-22 DIAGNOSIS — H60541 Acute eczematoid otitis externa, right ear: Secondary | ICD-10-CM | POA: Diagnosis not present

## 2023-01-22 DIAGNOSIS — Z0000 Encounter for general adult medical examination without abnormal findings: Secondary | ICD-10-CM

## 2023-01-22 DIAGNOSIS — R0609 Other forms of dyspnea: Secondary | ICD-10-CM | POA: Diagnosis not present

## 2023-01-22 DIAGNOSIS — H9191 Unspecified hearing loss, right ear: Secondary | ICD-10-CM | POA: Diagnosis not present

## 2023-01-22 DIAGNOSIS — R42 Dizziness and giddiness: Secondary | ICD-10-CM | POA: Diagnosis not present

## 2023-01-22 DIAGNOSIS — J439 Emphysema, unspecified: Secondary | ICD-10-CM | POA: Diagnosis not present

## 2023-01-22 DIAGNOSIS — D229 Melanocytic nevi, unspecified: Secondary | ICD-10-CM | POA: Diagnosis not present

## 2023-01-22 DIAGNOSIS — G250 Essential tremor: Secondary | ICD-10-CM

## 2023-01-22 LAB — LIPID PANEL
Cholesterol: 161 mg/dL (ref 0–200)
HDL: 50 mg/dL (ref >39.00)
LDL Cholesterol: 86 mg/dL (ref 0–99)
NonHDL: 111.04
Total CHOL/HDL Ratio: 3
Triglycerides: 123 mg/dL (ref 0.0–149.0)
VLDL: 24.6 mg/dL (ref 0.0–40.0)

## 2023-01-22 LAB — COMPREHENSIVE METABOLIC PANEL
ALT: 19 U/L (ref 0–53)
AST: 21 U/L (ref 0–37)
Albumin: 4.3 g/dL (ref 3.5–5.2)
Alkaline Phosphatase: 71 U/L (ref 39–117)
BUN: 17 mg/dL (ref 6–23)
CO2: 30 mEq/L (ref 19–32)
Calcium: 9.2 mg/dL (ref 8.4–10.5)
Chloride: 104 mEq/L (ref 96–112)
Creatinine, Ser: 1.34 mg/dL (ref 0.40–1.50)
GFR: 48.76 mL/min — ABNORMAL LOW (ref >60.00)
Glucose, Bld: 84 mg/dL (ref 70–99)
Potassium: 4.7 mEq/L (ref 3.5–5.1)
Sodium: 140 mEq/L (ref 135–145)
Total Bilirubin: 0.6 mg/dL (ref 0.2–1.2)
Total Protein: 6.6 g/dL (ref 6.0–8.3)

## 2023-01-22 LAB — CBC WITH DIFFERENTIAL/PLATELET
Basophils Absolute: 0 10*3/uL (ref 0.0–0.1)
Basophils Relative: 0.4 % (ref 0.0–3.0)
Eosinophils Absolute: 0.3 10*3/uL (ref 0.0–0.7)
Eosinophils Relative: 6 % — ABNORMAL HIGH (ref 0.0–5.0)
HCT: 47.8 % (ref 39.0–52.0)
Hemoglobin: 16.5 g/dL (ref 13.0–17.0)
Lymphocytes Relative: 13.2 % (ref 12.0–46.0)
Lymphs Abs: 0.7 10*3/uL (ref 0.7–4.0)
MCHC: 34.4 g/dL (ref 30.0–36.0)
MCV: 99.2 fl (ref 78.0–100.0)
Monocytes Absolute: 0.8 10*3/uL (ref 0.1–1.0)
Monocytes Relative: 16 % — ABNORMAL HIGH (ref 3.0–12.0)
Neutro Abs: 3.2 10*3/uL (ref 1.4–7.7)
Neutrophils Relative %: 64.4 % (ref 43.0–77.0)
Platelets: 157 10*3/uL (ref 150.0–400.0)
RBC: 4.82 Mil/uL (ref 4.22–5.81)
RDW: 12.9 % (ref 11.5–15.5)
WBC: 5 10*3/uL (ref 4.0–10.5)

## 2023-01-22 LAB — TSH: TSH: 1.19 u[IU]/mL (ref 0.35–5.50)

## 2023-01-22 LAB — PSA: PSA: 1.22 ng/mL (ref 0.10–4.00)

## 2023-01-22 MED ORDER — TRIAMCINOLONE ACETONIDE 0.1 % EX CREA
1.0000 | TOPICAL_CREAM | Freq: Two times a day (BID) | CUTANEOUS | 0 refills | Status: DC
Start: 2023-01-22 — End: 2023-12-19

## 2023-01-22 NOTE — Progress Notes (Addendum)
Subjective:   By signing my name below, I, Devin Schmidt, attest that this documentation has been prepared under the direction and in the presence of Ann Held, DO. 01/22/2023   Patient ID: Devin Schmidt, male    DOB: November 17, 1940, 82 y.o.   MRN: [id number]  Chief Complaint  Patient presents with   Annual Exam    Pt states fasting     HPI Patient is in today for a comprehensive physical exam.   He complains of dizziness when turning over from his left to right side last year. He denies having any recent episodes within the past couple of months. He denies having dizziness while standing up. He continues following up with his hematologist/oncologist and reports his blood work is normal. He continues following up with his ENT for hearing and reports no new changes. He has shortness of breathe and thinks his history of smoking for 50 years may have contributed to it. He has a history of COPD and was diagnosed 7 years ago but has not had a follow up for it since then. He denies having any heart palpitations, chest pains, wheezing, chest tightness, ear pain.  Lab Results  Component Value Date   IRON 218 (H) 12/12/2022   TIBC 251 12/12/2022   FERRITIN 35 12/12/2022   His blood pressure is good during this visit but his pulse is low.  BP Readings from Last 3 Encounters:  01/22/23 110/70  12/12/22 (!) 143/70  05/23/22 (!) 113/57   Pulse Readings from Last 3 Encounters:  01/22/23 (!) 59  12/12/22 66  05/23/22 64   He reports burning the bridge of his nose a couple of years ago and it has not healed well since. He is not following up with a dermatologist regularly at this time.  He denies fever, new moles, congestion, sinus pain, sore throat, chest pain, palpitations, cough, wheezing, nausea, vomiting, abdominal pain, diarrhea, constipation, dysuria, frequency, hematuria, new muscle pain, new joint pain, or headaches at this time.  His brother passed away from bladder cancer  recently. He notes his brother had a history of drinking excessively. Otherwise he has no other changes to his family medical history. He has 1 male partner.  He is due for pneumonia vaccine and is not interested in receiving it. He received the latest flu vaccine on October 2023. He is due for tetanus vaccine and is considering receiving it at his pharmacy. He is eligible for the RSV vaccine and is interested in receiving it at his pharmacy.  He participates in exercise by golfing.  Colonoscopy was last completed 03/13/2018. Result showed: - Internal hemorrhoids. - Small lipoma in the ascending colon. - The examination was otherwise normal on direct and retroflexion views. - No specimens collected. No repeat colonoscopy due to age.  Last PSA was 10/02/2021. Results showed 1.12. He is due for vision care. He is UTD on dental care. He received oral surgery last week to received his back upper molar due to it becoming infected.    Past Medical History:  Diagnosis Date   Allergy    Anal fissure - posterior 10/10/2016   Colon polyps    Tubular Adenoma and Hyperplastic    COPD (chronic obstructive pulmonary disease)    Epididymitis 01/03/2015   Goals of care, counseling/discussion 09/25/2021   Hemochromatosis    HX OF   Inflamed seborrheic keratosis    Sinusitis, acute maxillary 04/29/2015   URI (upper respiratory infection)     Past  Surgical History:  Procedure Laterality Date   APPENDECTOMY  1950's   COLONOSCOPY W/ BIOPSIES  10/20/2008   X 2   HEMORRHOID BANDING     INGUINAL HERNIA REPAIR Right 02/01/2015   INGUINAL HERNIA REPAIR N/A 02/01/2015   Procedure: LAPAROSCOPIC RIGHT INGUINAL HERNIA REPAIR;  Surgeon: Doreen Salvage, MD;  Location: [facility];  Service: General;  Laterality: N/A;   INSERTION OF MESH Right 02/01/2015   Procedure: INSERTION OF MESH;  Surgeon: Doreen Salvage, MD;  Location: [facility];  Service: General;  Laterality: Right;   TONSILLECTOMY  1950's    Family History  Problem Relation  Age of Onset   Cancer Brother    Colon cancer Neg Hx    Pancreatic cancer Neg Hx    Rectal cancer Neg Hx    Stomach cancer Neg Hx    Esophageal cancer Neg Hx    Liver cancer Neg Hx     Social History   Socioeconomic History   Marital status: Divorced    Spouse name: Not on file   Number of children: 1   Years of education: Not on file   Highest education level: Not on file  Occupational History   Occupation: RETIRED    Employer: RETIRED  Tobacco Use   Smoking status: Former    Packs/day: 2.00    Years: 55.00    Additional pack years: 0.00    Total pack years: 110.00    Types: Cigarettes    Quit date: 08/14/2009    Years since quitting: 13.4   Smokeless tobacco: Never   Tobacco comments:    Counseled to remain smoke free.  Vaping Use   Vaping Use: Never used  Substance and Sexual Activity   Alcohol use: Yes    Alcohol/week: 2.0 standard drinks of alcohol    Types: 2 Standard drinks or equivalent per week    Comment: 02/01/2015 rare (one time per month)   Drug use: No   Sexual activity: Not Currently    Partners: Female  Other Topics Concern   Not on file  Social History Narrative   Exercise-- , except golf, walking    Live with girlfriend   Social Determinants of Health   Financial Resource Strain: Not on file  Food Insecurity: Not on file  Transportation Needs: Not on file  Physical Activity: Not on file  Stress: Not on file  Social Connections: Not on file  Intimate Partner Violence: Not on file    Outpatient Medications Prior to Visit  Medication Sig Dispense Refill   primidone (MYSOLINE) 50 MG tablet Take 1 tablet (50 mg total) by mouth at bedtime. 90 tablet 0   AMBULATORY NON FORMULARY MEDICATION Lidocaine 5% mixed with Diltiazem gel 2% : Apply rectal twice a day as needed. (Patient not taking: Reported on 12/12/2022) 30 g 1   No facility-administered medications prior to visit.    No Known Allergies  Review of Systems  Constitutional:   Negative for fever and malaise/fatigue.  HENT:  Negative for congestion, ear pain, sinus pain and sore throat.   Eyes:  Negative for blurred vision.  Respiratory:  Positive for shortness of breath. Negative for cough and wheezing.        (-)chest tightness  Cardiovascular:  Negative for chest pain, palpitations and leg swelling.  Gastrointestinal:  Negative for abdominal pain, blood in stool, constipation, diarrhea, nausea and vomiting.  Genitourinary:  Negative for dysuria, frequency and hematuria.  Musculoskeletal:  Negative for falls.       (-)  new muscle pain (-)new joint pain  Skin:  Negative for rash.       (-)New moles  Neurological:  Positive for dizziness. Negative for loss of consciousness and headaches.  Endo/Heme/Allergies:  Negative for environmental allergies.  Psychiatric/Behavioral:  Negative for depression. The patient is not nervous/anxious.        Objective:    Physical Exam Vitals and nursing note reviewed.  Constitutional:      General: He is not in acute distress.    Appearance: Normal appearance. He is not ill-appearing.  HENT:     Head: Normocephalic and atraumatic.     Right Ear: Tympanic membrane, ear canal and external ear normal.     Left Ear: Tympanic membrane, ear canal and external ear normal.     Ears:     Comments: Cerumen in both ears (R>L) Dry flaky skin left ear    Mouth/Throat:     Mouth: Mucous membranes are moist.     Pharynx: Oropharynx is clear. No oropharyngeal exudate or posterior oropharyngeal erythema.  Eyes:     Extraocular Movements: Extraocular movements intact.     Pupils: Pupils are equal, round, and reactive to light.  Cardiovascular:     Rate and Rhythm: Normal rate and regular rhythm.     Heart sounds: Normal heart sounds. No murmur heard. Pulmonary:     Effort: Pulmonary effort is normal. No respiratory distress.     Breath sounds: Normal breath sounds. No wheezing or rales.  Abdominal:     General: Bowel sounds are  normal. There is no distension.     Tenderness: There is no abdominal tenderness. There is no guarding.  Musculoskeletal:        General: Normal range of motion.  Lymphadenopathy:     Cervical: No cervical adenopathy.  Skin:    General: Skin is warm and dry.  Neurological:     General: No focal deficit present.     Mental Status: He is alert and oriented to person, place, and time.     Motor: No weakness.     Gait: Gait normal.     Deep Tendon Reflexes: Reflexes normal.  Psychiatric:        Mood and Affect: Mood normal.        Behavior: Behavior normal.     BP 110/70 (BP Location: Left Arm, Patient Position: Sitting, Cuff Size: Normal)   Pulse (!) 59   Temp 98.1 F (36.7 C) (Oral)   Resp 18   Ht 6' (1.829 m)   Wt 194 lb (88 kg)   SpO2 98%   BMI 26.31 kg/m  Wt Readings from Last 3 Encounters:  01/22/23 194 lb (88 kg)  12/12/22 197 lb (89.4 kg)  05/23/22 197 lb (89.4 kg)       Assessment & Plan:  Preventative health care Assessment & Plan: Ghm utd Check labs  Health Maintenance  Topic Date Due   DTaP/Tdap/Td (1 - Tdap) Never done   Medicare Annual Wellness (AWV)  09/09/2014   COVID-19 Vaccine (5 - 2023-24 season) 02/07/2023 (Originally 06/22/2022)   Zoster Vaccines- Shingrix (1 of 2) 04/23/2023 (Originally 11/19/1957)   Pneumonia Vaccine 52+ Years old (1 of 1 - PCV) 01/22/2024 (Originally 11/20/2003)   INFLUENZA VACCINE  05/23/2023   HPV VACCINES  Aged Out      Pulmonary emphysema, unspecified emphysema type -     CT CHEST LUNG CANCER SCREENING LOW DOSE WO CONTRAST; Future -     EKG 12-Lead -  Lipid panel -     PSA -     TSH -     Comprehensive metabolic panel -     CBC with Differential/Platelet  DOE (dyspnea on exertion) -     EKG 12-Lead -     Lipid panel -     PSA -     TSH -     Comprehensive metabolic panel -     CBC with Differential/Platelet  Suspicious nevus -     Ambulatory referral to Dermatology  Eczema of right external  ear Assessment & Plan: Triamcinolone cream  Refer to derm   Orders: -     Ambulatory referral to Dermatology -     Triamcinolone Acetonide; Apply 1 Application topically 2 (two) times daily.  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  All medical record entries made by the scribe were at my direction and in my presence.  I have reviewed the chart and discharge instructions (if applicable) and agree that the record reflects my personal performance and is accurate and complete. 01/22/2023   I,Devin Schmidt,acting as a scribe for Ann Held, DO.,have documented all relevant documentation on the behalf of Ann Held, DO,as directed by  Ann Held, DO while in the presence of Ann Held, DO.   Ann Held, DO

## 2023-01-22 NOTE — Assessment & Plan Note (Signed)
Per dr ennever 

## 2023-01-22 NOTE — Assessment & Plan Note (Signed)
EKG-- sinus bradycardia  Refer to ent --- for hearing loss, dizziness and cerumen impaction

## 2023-01-22 NOTE — Assessment & Plan Note (Signed)
Stable  Con't primidone

## 2023-01-22 NOTE — Assessment & Plan Note (Signed)
Ghm utd Check labs  Health Maintenance  Topic Date Due   DTaP/Tdap/Td (1 - Tdap) Never done   Medicare Annual Wellness (AWV)  09/09/2014   COVID-19 Vaccine (5 - 2023-24 season) 02/07/2023 (Originally 06/22/2022)   Zoster Vaccines- Shingrix (1 of 2) 04/23/2023 (Originally 11/19/1957)   Pneumonia Vaccine 32+ Years old (1 of 1 - PCV) 01/22/2024 (Originally 11/20/2003)   INFLUENZA VACCINE  05/23/2023   HPV VACCINES  Aged Out

## 2023-01-22 NOTE — Assessment & Plan Note (Signed)
Triamcinolone cream  Refer to derm

## 2023-01-22 NOTE — Assessment & Plan Note (Signed)
Stable

## 2023-01-28 ENCOUNTER — Encounter: Payer: Self-pay | Admitting: Family Medicine

## 2023-01-28 ENCOUNTER — Telehealth (INDEPENDENT_AMBULATORY_CARE_PROVIDER_SITE_OTHER): Payer: Medicare Other | Admitting: Family Medicine

## 2023-01-28 DIAGNOSIS — U071 COVID-19: Secondary | ICD-10-CM

## 2023-01-28 NOTE — Assessment & Plan Note (Signed)
Pt is feeling better Pt decided to not take paxlovid  Quarantine for 5 days and then can wear a mask and leave house for 5 more days

## 2023-01-28 NOTE — Progress Notes (Signed)
MyChart Video Visit    Virtual Visit via Video Note   This visit type was conducted due to national recommendations for restrictions regarding the COVID-19 Pandemic (e.g. social distancing) in an effort to limit this patient's exposure and mitigate transmission in our community. This patient is at least at moderate risk for complications without adequate follow up. This format is felt to be most appropriate for this patient at this time. Physical exam was limited by quality of the video and audio technology used for the visit. Devin Schmidt was able to get the patient set up on a video visit.  Patient location: Home Patient and provider in visit Provider location: Office  I discussed the limitations of evaluation and management by telemedicine and the availability of in person appointments. The patient expressed understanding and agreed to proceed.  Visit Date: 01/28/2023  Today's healthcare provider: Donato SchultzYvonne R Lowne Chase, DO     Subjective:    Patient ID: Devin Schmidt, male    DOB: 04/21/1941, 82 y.o.   MRN: [id number]  Chief Complaint  Patient presents with   Covid Positive    HPI Patient is in today for a sick visit.   He complains of cough, body aches, congestion, runny nose, and watery eyes starting Saturday night. He tested positive using an at home Covid test on 4/7. He was unable to take his temperature, but endorses feeling warm. He states his symptoms were worse yesterday and have improved today.   Past Medical History:  Diagnosis Date   Allergy    Anal fissure - posterior 10/10/2016   Colon polyps    Tubular Adenoma and Hyperplastic    COPD (chronic obstructive pulmonary disease)    Epididymitis 01/03/2015   Goals of care, counseling/discussion 09/25/2021   Hemochromatosis    HX OF   Inflamed seborrheic keratosis    Sinusitis, acute maxillary 04/29/2015   URI (upper respiratory infection)     Past Surgical History:  Procedure Laterality Date   APPENDECTOMY   1950's   COLONOSCOPY W/ BIOPSIES  10/20/2008   X 2   HEMORRHOID BANDING     INGUINAL HERNIA REPAIR Right 02/01/2015   INGUINAL HERNIA REPAIR N/A 02/01/2015   Procedure: LAPAROSCOPIC RIGHT INGUINAL HERNIA REPAIR;  Surgeon: Frederik SchmidtJay Wyatt, MD;  Location: [facility];  Service: General;  Laterality: N/A;   INSERTION OF MESH Right 02/01/2015   Procedure: INSERTION OF MESH;  Surgeon: Frederik SchmidtJay Wyatt, MD;  Location: [facility];  Service: General;  Laterality: Right;   TONSILLECTOMY  1950's    Family History  Problem Relation Age of Onset   Cancer Brother    Colon cancer Neg Hx    Pancreatic cancer Neg Hx    Rectal cancer Neg Hx    Stomach cancer Neg Hx    Esophageal cancer Neg Hx    Liver cancer Neg Hx     Social History   Socioeconomic History   Marital status: Divorced    Spouse name: Not on file   Number of children: 1   Years of education: Not on file   Highest education level: Not on file  Occupational History   Occupation: RETIRED    Employer: RETIRED  Tobacco Use   Smoking status: Former    Packs/day: 2.00    Years: 55.00    Additional pack years: 0.00    Total pack years: 110.00    Types: Cigarettes    Quit date: 08/14/2009    Years since quitting: 13.4   Smokeless  tobacco: Never   Tobacco comments:    Counseled to remain smoke free.  Vaping Use   Vaping Use: Never used  Substance and Sexual Activity   Alcohol use: Yes    Alcohol/week: 2.0 standard drinks of alcohol    Types: 2 Standard drinks or equivalent per week    Comment: 02/01/2015 rare (one time per month)   Drug use: No   Sexual activity: Not Currently    Partners: Female  Other Topics Concern   Not on file  Social History Narrative   Exercise-- , except golf, walking    Live with girlfriend   Social Determinants of Health   Financial Resource Strain: Not on file  Food Insecurity: Not on file  Transportation Needs: Not on file  Physical Activity: Not on file  Stress: Not on file  Social Connections: Not on file   Intimate Partner Violence: Not on file    Outpatient Medications Prior to Visit  Medication Sig Dispense Refill   primidone (MYSOLINE) 50 MG tablet Take 1 tablet (50 mg total) by mouth at bedtime. 90 tablet 0   triamcinolone cream (KENALOG) 0.1 % Apply 1 Application topically 2 (two) times daily. 30 g 0   AMBULATORY NON FORMULARY MEDICATION Lidocaine 5% mixed with Diltiazem gel 2% : Apply rectal twice a day as needed. (Patient not taking: Reported on 12/12/2022) 30 g 1   No facility-administered medications prior to visit.    No Known Allergies  Review of Systems  Constitutional:  Negative for fever and malaise/fatigue.  HENT:  Positive for congestion.        (+) runny nose  Eyes:  Negative for blurred vision.       (+) watery eyes  Respiratory:  Positive for cough. Negative for shortness of breath.   Cardiovascular:  Negative for chest pain, palpitations and leg swelling.  Gastrointestinal:  Negative for abdominal pain, blood in stool and nausea.  Genitourinary:  Negative for dysuria and frequency.  Musculoskeletal:  Positive for myalgias (body aches). Negative for falls.  Skin:  Negative for rash.  Neurological:  Negative for dizziness, loss of consciousness and headaches.  Endo/Heme/Allergies:  Negative for environmental allergies.  Psychiatric/Behavioral:  Negative for depression. The patient is not nervous/anxious.        Objective:    Physical Exam Vitals and nursing note reviewed.  Constitutional:      General: He is not in acute distress.    Appearance: He is not ill-appearing or toxic-appearing.  Pulmonary:     Effort: Pulmonary effort is normal.  Neurological:     Mental Status: He is alert.     There were no vitals taken for this visit. Wt Readings from Last 3 Encounters:  01/22/23 194 lb (88 kg)  12/12/22 197 lb (89.4 kg)  05/23/22 197 lb (89.4 kg)    Diabetic Foot Exam - Simple   No data filed    Lab Results  Component Value Date   WBC 5.0  01/22/2023   HGB 16.5 01/22/2023   HCT 47.8 01/22/2023   PLT 157.0 01/22/2023   GLUCOSE 84 01/22/2023   CHOL 161 01/22/2023   TRIG 123.0 01/22/2023   HDL 50.00 01/22/2023   LDLDIRECT 72.0 09/14/2019   LDLCALC 86 01/22/2023   ALT 19 01/22/2023   AST 21 01/22/2023   NA 140 01/22/2023   K 4.7 01/22/2023   CL 104 01/22/2023   CREATININE 1.34 01/22/2023   BUN 17 01/22/2023   CO2 30 01/22/2023   TSH  1.19 01/22/2023   PSA 1.22 01/22/2023    Lab Results  Component Value Date   TSH 1.19 01/22/2023   Lab Results  Component Value Date   WBC 5.0 01/22/2023   HGB 16.5 01/22/2023   HCT 47.8 01/22/2023   MCV 99.2 01/22/2023   PLT 157.0 01/22/2023   Lab Results  Component Value Date   NA 140 01/22/2023   K 4.7 01/22/2023   CO2 30 01/22/2023   GLUCOSE 84 01/22/2023   BUN 17 01/22/2023   CREATININE 1.34 01/22/2023   BILITOT 0.6 01/22/2023   ALKPHOS 71 01/22/2023   AST 21 01/22/2023   ALT 19 01/22/2023   PROT 6.6 01/22/2023   ALBUMIN 4.3 01/22/2023   CALCIUM 9.2 01/22/2023   ANIONGAP 6 12/12/2022   GFR 48.76 (L) 01/22/2023   Lab Results  Component Value Date   CHOL 161 01/22/2023   Lab Results  Component Value Date   HDL 50.00 01/22/2023   Lab Results  Component Value Date   LDLCALC 86 01/22/2023   Lab Results  Component Value Date   TRIG 123.0 01/22/2023   Lab Results  Component Value Date   CHOLHDL 3 01/22/2023   No results found for: "HGBA1C"     Assessment & Plan:   Problem List Items Addressed This Visit       Unprioritized   COVID-19 - Primary    Pt is feeling better Pt decided to not take paxlovid  Quarantine for 5 days and then can wear a mask and leave house for 5 more days        No orders of the defined types were placed in this encounter.   I discussed the assessment and treatment plan with the patient. The patient was provided an opportunity to ask questions and all were answered. The patient agreed with the plan and demonstrated  an understanding of the instructions.   The patient was advised to call back or seek an in-person evaluation if the symptoms worsen or if the condition fails to improve as anticipated.   I,Rachel Rivera,acting as a Neurosurgeon for [employer], DO.,have documented all relevant documentation on the behalf of Donato Schultz, DO,as directed by  Donato Schultz, DO while in the presence of Donato Schultz, DO.   Donato Schultz, DO [facility] [phone] (phone) [phone] (fax)  [facility]

## 2023-02-05 ENCOUNTER — Ambulatory Visit (HOSPITAL_BASED_OUTPATIENT_CLINIC_OR_DEPARTMENT_OTHER)
Admission: RE | Admit: 2023-02-05 | Discharge: 2023-02-05 | Disposition: A | Discharge status: Home / Self Care | Payer: Medicare Other | Source: Ambulatory Visit | Attending: Family Medicine | Admitting: Family Medicine

## 2023-02-05 DIAGNOSIS — J439 Emphysema, unspecified: Secondary | ICD-10-CM | POA: Insufficient documentation

## 2023-02-05 DIAGNOSIS — I700 Atherosclerosis of aorta: Secondary | ICD-10-CM | POA: Diagnosis not present

## 2023-02-05 DIAGNOSIS — Z122 Encounter for screening for malignant neoplasm of respiratory organs: Secondary | ICD-10-CM | POA: Insufficient documentation

## 2023-02-05 DIAGNOSIS — Z87891 Personal history of nicotine dependence: Secondary | ICD-10-CM | POA: Diagnosis not present

## 2023-02-05 DIAGNOSIS — R911 Solitary pulmonary nodule: Secondary | ICD-10-CM | POA: Diagnosis not present

## 2023-02-05 DIAGNOSIS — I2510 Atherosclerotic heart disease of native coronary artery without angina pectoris: Secondary | ICD-10-CM | POA: Diagnosis not present

## 2023-02-08 ENCOUNTER — Telehealth: Payer: Self-pay | Admitting: *Deleted

## 2023-02-08 NOTE — Telephone Encounter (Signed)
FYI: Per CMS age guidelines for lung cancer screening pt must be between Age 84-77 to qualify for lung cancer screening Referral has been cancelled.

## 2023-03-25 ENCOUNTER — Telehealth: Payer: Self-pay | Admitting: Family Medicine

## 2023-03-25 DIAGNOSIS — G250 Essential tremor: Secondary | ICD-10-CM

## 2023-03-25 MED ORDER — PRIMIDONE 50 MG PO TABS
50.0000 mg | ORAL_TABLET | Freq: Every day | ORAL | 0 refills | Status: DC
Start: 2023-03-25 — End: 2023-06-25

## 2023-03-25 NOTE — Telephone Encounter (Signed)
Medication:  primidone (MYSOLINE) 50 MG tablet  Has the patient contacted their pharmacy? Yes.     Preferred Pharmacy: [facility] [address], [place] - [address] [place] [address] Phone: [phone]  Fax: [phone]

## 2023-03-25 NOTE — Telephone Encounter (Signed)
Rx has been sent in. 

## 2023-06-13 ENCOUNTER — Inpatient Hospital Stay: Payer: Medicare Other | Admitting: Hematology & Oncology

## 2023-06-13 ENCOUNTER — Encounter: Payer: Self-pay | Admitting: Hematology & Oncology

## 2023-06-13 ENCOUNTER — Inpatient Hospital Stay: Payer: Medicare Other | Attending: Hematology & Oncology

## 2023-06-13 LAB — CMP (CANCER CENTER ONLY)
ALT: 10 U/L (ref 0–44)
AST: 11 U/L — ABNORMAL LOW (ref 15–41)
Albumin: 4.3 g/dL (ref 3.5–5.0)
Alkaline Phosphatase: 66 U/L (ref 38–126)
Anion gap: 6 (ref 5–15)
BUN: 17 mg/dL (ref 8–23)
CO2: 31 mmol/L (ref 22–32)
Calcium: 9.2 mg/dL (ref 8.9–10.3)
Chloride: 105 mmol/L (ref 98–111)
Creatinine: 1.53 mg/dL — ABNORMAL HIGH (ref 0.61–1.24)
GFR, Estimated: 45 mL/min — ABNORMAL LOW (ref >60)
Glucose, Bld: 68 mg/dL — ABNORMAL LOW (ref 70–99)
Potassium: 4.7 mmol/L (ref 3.5–5.1)
Sodium: 142 mmol/L (ref 135–145)
Total Bilirubin: 0.4 mg/dL (ref 0.3–1.2)
Total Protein: 6.6 g/dL (ref 6.5–8.1)

## 2023-06-13 LAB — CBC WITH DIFFERENTIAL (CANCER CENTER ONLY)
Abs Immature Granulocytes: 0.02 10*3/uL (ref 0.00–0.07)
Basophils Absolute: 0 10*3/uL (ref 0.0–0.1)
Basophils Relative: 1 %
Eosinophils Absolute: 0.3 10*3/uL (ref 0.0–0.5)
Eosinophils Relative: 4 %
HCT: 47.5 % (ref 39.0–52.0)
Hemoglobin: 15.9 g/dL (ref 13.0–17.0)
Immature Granulocytes: 0 %
Lymphocytes Relative: 14 %
Lymphs Abs: 1 10*3/uL (ref 0.7–4.0)
MCH: 33.7 pg (ref 26.0–34.0)
MCHC: 33.5 g/dL (ref 30.0–36.0)
MCV: 100.6 fL — ABNORMAL HIGH (ref 80.0–100.0)
Monocytes Absolute: 0.8 10*3/uL (ref 0.1–1.0)
Monocytes Relative: 12 %
Neutro Abs: 4.7 10*3/uL (ref 1.7–7.7)
Neutrophils Relative %: 69 %
Platelet Count: 167 10*3/uL (ref 150–400)
RBC: 4.72 MIL/uL (ref 4.22–5.81)
RDW: 12.5 % (ref 11.5–15.5)
WBC Count: 6.8 10*3/uL (ref 4.0–10.5)
nRBC: 0 % (ref 0.0–0.2)

## 2023-06-13 LAB — FERRITIN: Ferritin: 26 ng/mL (ref 24–336)

## 2023-06-13 NOTE — Progress Notes (Signed)
Hematology and Oncology Follow Up Visit  Devin Schmidt [id number] Mar 15, 1941 82 y.o. 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 --He donates blood to [facility] every 3-4 months.     Interim History:  Mr. Devin Schmidt is back for follow-up.  We last saw him 6 months ago.  Since then, has been donating blood.  He is doing quite well with his blood donation.  When we last saw him, his ferritin was only 35.  His iron saturation was 87%.  However, I think this is more reflective of iron avidity.  He is been playing golf.  He has been trying to stay active.  He is eating well.  He is trying to hydrate.  Has been no problems with bowels or bladder.  He has had no cough or shortness of breath.  He has had no problems with COVID.Marland Kitchen  There is been no leg swelling.  He has had no headache.  Overall, I would say performance status is probably ECOG 0.    Medications:  Current Outpatient Medications:    primidone (MYSOLINE) 50 MG tablet, Take 1 tablet (50 mg total) by mouth at bedtime., Disp: 90 tablet, Rfl: 0   triamcinolone cream (KENALOG) 0.1 %, Apply 1 Application topically 2 (two) times daily., Disp: 30 g, Rfl: 0  Allergies: No Known Allergies  Past Medical History, Surgical history, Social history, and Family History were reviewed and updated.  Review of Systems: Review of Systems  Constitutional: Negative.   HENT:  Negative.    Eyes: Negative.   Respiratory: Negative.    Cardiovascular: Negative.   Gastrointestinal: Negative.   Endocrine: Negative.   Genitourinary: Negative.    Musculoskeletal: Negative.   Skin: Negative.   Neurological: Negative.   Hematological: Negative.   Psychiatric/Behavioral: Negative.      Physical Exam:  height is 6' (1.829 m) and weight is 195 lb 12.8 oz (88.8 kg). His oral temperature is 97.9 F (36.6  C). His blood pressure is 139/73 and his pulse is 63. His respiration is 20 and oxygen saturation is 100%.   Wt Readings from Last 3 Encounters:  06/13/23 195 lb 12.8 oz (88.8 kg)  01/22/23 194 lb (88 kg)  12/12/22 197 lb (89.4 kg)    Physical Exam Vitals reviewed.  HENT:     Head: Normocephalic and atraumatic.  Eyes:     Pupils: Pupils are equal, round, and reactive to light.  Cardiovascular:     Rate and Rhythm: Normal rate and regular rhythm.     Heart sounds: Normal heart sounds.  Pulmonary:     Effort: Pulmonary effort is normal.     Breath sounds: Normal breath sounds.  Abdominal:     General: Bowel sounds are normal.     Palpations: Abdomen is soft.  Musculoskeletal:        General: No tenderness or deformity. Normal range of motion.     Cervical back: Normal range of motion.  Lymphadenopathy:     Cervical: No cervical adenopathy.  Skin:    General: Skin is warm and dry.     Findings: No erythema or rash.  Neurological:     Mental Status: He is alert and oriented to person, place, and time.  Psychiatric:        Behavior: Behavior normal.        Thought Content: Thought content normal.  Judgment: Judgment normal.    Lab Results  Component Value Date   WBC 6.8 06/13/2023   HGB 15.9 06/13/2023   HCT 47.5 06/13/2023   MCV 100.6 (H) 06/13/2023   PLT 167 06/13/2023     Chemistry      Component Value Date/Time   NA 140 01/22/2023 0955   K 4.7 01/22/2023 0955   CL 104 01/22/2023 0955   CO2 30 01/22/2023 0955   BUN 17 01/22/2023 0955   CREATININE 1.34 01/22/2023 0955   CREATININE 1.69 (H) 12/12/2022 1457      Component Value Date/Time   CALCIUM 9.2 01/22/2023 0955   ALKPHOS 71 01/22/2023 0955   AST 21 01/22/2023 0955   AST 12 (L) 12/12/2022 1457   ALT 19 01/22/2023 0955   ALT 11 12/12/2022 1457   BILITOT 0.6 01/22/2023 0955   BILITOT 0.5 12/12/2022 1457      Impression and Plan: Mr. Devin Schmidt is a very nice 82 year old white male.  He has  hemochromatosis.  He has a major mutation.  He is homozygous for this.    I will still go ahead and make sure he donates blood every 2-3 months.  He certainly has enough blood that he can donate.  His blood type is B+.  This is needed in the community.   I will go ahead and plan to see him back in another 6 months.     Josph Macho, MD 8/22/20243:20 PM

## 2023-06-14 LAB — IRON AND IRON BINDING CAPACITY (CC-WL,HP ONLY)
Iron: 158 ug/dL (ref 45–182)
Saturation Ratios: 62 % — ABNORMAL HIGH (ref 17.9–39.5)
TIBC: 253 ug/dL (ref 250–450)
UIBC: 95 ug/dL — ABNORMAL LOW (ref 117–376)

## 2023-06-25 ENCOUNTER — Other Ambulatory Visit: Payer: Self-pay | Admitting: Family Medicine

## 2023-06-25 ENCOUNTER — Telehealth: Payer: Self-pay | Admitting: Family Medicine

## 2023-06-25 DIAGNOSIS — G250 Essential tremor: Secondary | ICD-10-CM

## 2023-06-25 MED ORDER — PRIMIDONE 50 MG PO TABS
50.0000 mg | ORAL_TABLET | Freq: Every day | ORAL | 0 refills | Status: DC
Start: 2023-06-25 — End: 2023-09-23

## 2023-06-25 NOTE — Addendum Note (Signed)
Addended by: Roxanne Gates on: 06/25/2023 10:32 AM   Modules accepted: Orders

## 2023-06-25 NOTE — Telephone Encounter (Signed)
Patient requesting Primidone refill to be sent to [facility] on [facility].

## 2023-06-25 NOTE — Telephone Encounter (Signed)
Rx sent 

## 2023-09-23 ENCOUNTER — Telehealth: Payer: Self-pay | Admitting: Family Medicine

## 2023-09-23 DIAGNOSIS — G250 Essential tremor: Secondary | ICD-10-CM

## 2023-09-23 MED ORDER — PRIMIDONE 50 MG PO TABS
50.0000 mg | ORAL_TABLET | Freq: Every day | ORAL | 1 refills | Status: DC
Start: 2023-09-23 — End: 2024-03-19

## 2023-09-23 NOTE — Addendum Note (Signed)
Addended by: Roxanne Gates on: 09/23/2023 11:27 AM   Modules accepted: Orders

## 2023-09-23 NOTE — Telephone Encounter (Signed)
Refills sent

## 2023-09-23 NOTE — Telephone Encounter (Signed)
Prescription Request  09/23/2023  Is this a "Controlled Substance" medicine? No  LOV: Visit date not found  What is the name of the medication or equipment?   primidone (MYSOLINE) 50 MG tablet  Have you contacted your pharmacy to request a refill? No   Which pharmacy would you like this sent to?  [facility] [address] [place] - [address] [place] [address] Phone: [phone] Fax: [phone]    Patient notified that their request is being sent to the clinical staff for review and that they should receive a response within 2 business days.   Please advise at [facility] [phone]

## 2023-11-07 ENCOUNTER — Telehealth: Payer: Self-pay | Admitting: *Deleted

## 2023-11-07 NOTE — Telephone Encounter (Signed)
This nurse returned patient's phone call regarding a new order be faxed to [facility] for a phlebotomy. Orders faxed to [phone]. Patient needs to go tomorrow for a phlebotomy. He verbalized understanding.

## 2023-11-25 DIAGNOSIS — L218 Other seborrheic dermatitis: Secondary | ICD-10-CM | POA: Diagnosis not present

## 2023-11-25 DIAGNOSIS — L308 Other specified dermatitis: Secondary | ICD-10-CM | POA: Diagnosis not present

## 2023-12-19 ENCOUNTER — Inpatient Hospital Stay: Payer: Medicare Other | Attending: Hematology & Oncology

## 2023-12-19 ENCOUNTER — Inpatient Hospital Stay: Payer: Medicare Other | Admitting: Hematology & Oncology

## 2023-12-19 ENCOUNTER — Encounter: Payer: Self-pay | Admitting: Hematology & Oncology

## 2023-12-19 LAB — CBC WITH DIFFERENTIAL (CANCER CENTER ONLY)
Abs Immature Granulocytes: 0.04 10*3/uL (ref 0.00–0.07)
Basophils Absolute: 0 10*3/uL (ref 0.0–0.1)
Basophils Relative: 1 %
Eosinophils Absolute: 0.3 10*3/uL (ref 0.0–0.5)
Eosinophils Relative: 5 %
HCT: 48.1 % (ref 39.0–52.0)
Hemoglobin: 16.3 g/dL (ref 13.0–17.0)
Immature Granulocytes: 1 %
Lymphocytes Relative: 15 %
Lymphs Abs: 1 10*3/uL (ref 0.7–4.0)
MCH: 33.7 pg (ref 26.0–34.0)
MCHC: 33.9 g/dL (ref 30.0–36.0)
MCV: 99.6 fL (ref 80.0–100.0)
Monocytes Absolute: 0.8 10*3/uL (ref 0.1–1.0)
Monocytes Relative: 13 %
Neutro Abs: 4.2 10*3/uL (ref 1.7–7.7)
Neutrophils Relative %: 65 %
Platelet Count: 170 10*3/uL (ref 150–400)
RBC: 4.83 MIL/uL (ref 4.22–5.81)
RDW: 12.4 % (ref 11.5–15.5)
WBC Count: 6.5 10*3/uL (ref 4.0–10.5)
nRBC: 0 % (ref 0.0–0.2)

## 2023-12-19 LAB — CMP (CANCER CENTER ONLY)
ALT: 12 U/L (ref 0–44)
AST: 12 U/L — ABNORMAL LOW (ref 15–41)
Albumin: 4.4 g/dL (ref 3.5–5.0)
Alkaline Phosphatase: 64 U/L (ref 38–126)
Anion gap: 5 (ref 5–15)
BUN: 19 mg/dL (ref 8–23)
CO2: 31 mmol/L (ref 22–32)
Calcium: 9.7 mg/dL (ref 8.9–10.3)
Chloride: 106 mmol/L (ref 98–111)
Creatinine: 1.57 mg/dL — ABNORMAL HIGH (ref 0.61–1.24)
GFR, Estimated: 43 mL/min — ABNORMAL LOW (ref >60)
Glucose, Bld: 77 mg/dL (ref 70–99)
Potassium: 4.8 mmol/L (ref 3.5–5.1)
Sodium: 142 mmol/L (ref 135–145)
Total Bilirubin: 0.4 mg/dL (ref 0.0–1.2)
Total Protein: 6.2 g/dL — ABNORMAL LOW (ref 6.5–8.1)

## 2023-12-19 LAB — RETICULOCYTES
Immature Retic Fract: 9.9 % (ref 2.3–15.9)
RBC.: 4.83 MIL/uL (ref 4.22–5.81)
Retic Count, Absolute: 60.9 10*3/uL (ref 19.0–186.0)
Retic Ct Pct: 1.3 % (ref 0.4–3.1)

## 2023-12-19 LAB — FERRITIN: Ferritin: 27 ng/mL (ref 24–336)

## 2023-12-19 NOTE — Progress Notes (Signed)
 Hematology and Oncology Follow Up Visit  Devin Schmidt [id number] 1940-11-23 83 y.o. 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 --He donates blood to [facility] every 3-4 months.     Interim History:  Devin Schmidt is back for follow-up.  We see him every 6 months.  Since we last saw him, he is doing quite nicely.  He is donating blood.  I give him a lot of credit for donating blood.  When we last saw him, his ferritin was 26 with an iron saturation of 72%.  I think some of the iron saturation is probably from iron avidity.  He has had no problem with COVID.  He has had no issues over the Holiday season.  There is been no problems with nausea or vomiting.  He is eating well.  He has had no bleeding.  There is been no fever.  He has had no rashes.  He has had no leg swelling.  Overall, I would have said that his performance status is ECOG 0.      Medications:  Current Outpatient Medications:    primidone (MYSOLINE) 50 MG tablet, Take 1 tablet (50 mg total) by mouth at bedtime., Disp: 90 tablet, Rfl: 1  Allergies: No Known Allergies  Past Medical History, Surgical history, Social history, and Family History were reviewed and updated.  Review of Systems: Review of Systems  Constitutional: Negative.   HENT:  Negative.    Eyes: Negative.   Respiratory: Negative.    Cardiovascular: Negative.   Gastrointestinal: Negative.   Endocrine: Negative.   Genitourinary: Negative.    Musculoskeletal: Negative.   Skin: Negative.   Neurological: Negative.   Hematological: Negative.   Psychiatric/Behavioral: Negative.      Physical Exam:  height is 6' (1.829 m) and weight is 200 lb 6.4 oz (90.9 kg). His oral temperature is 97.8 F (36.6 C). His blood pressure is 141/59 (abnormal) and his pulse is 59 (abnormal). His respiration is 18 and  oxygen saturation is 100%.   Wt Readings from Last 3 Encounters:  12/19/23 200 lb 6.4 oz (90.9 kg)  06/13/23 195 lb 12.8 oz (88.8 kg)  01/22/23 194 lb (88 kg)    Physical Exam Vitals reviewed.  HENT:     Head: Normocephalic and atraumatic.  Eyes:     Pupils: Pupils are equal, round, and reactive to light.  Cardiovascular:     Rate and Rhythm: Normal rate and regular rhythm.     Heart sounds: Normal heart sounds.  Pulmonary:     Effort: Pulmonary effort is normal.     Breath sounds: Normal breath sounds.  Abdominal:     General: Bowel sounds are normal.     Palpations: Abdomen is soft.  Musculoskeletal:        General: No tenderness or deformity. Normal range of motion.     Cervical back: Normal range of motion.  Lymphadenopathy:     Cervical: No cervical adenopathy.  Skin:    General: Skin is warm and dry.     Findings: No erythema or rash.  Neurological:     Mental Status: He is alert and oriented to person, place, and time.  Psychiatric:        Behavior: Behavior normal.        Thought Content: Thought content normal.        Judgment: Judgment  normal.   Lab Results  Component Value Date   WBC 6.5 12/19/2023   HGB 16.3 12/19/2023   HCT 48.1 12/19/2023   MCV 99.6 12/19/2023   PLT 170 12/19/2023     Chemistry      Component Value Date/Time   NA 142 06/13/2023 1453   K 4.7 06/13/2023 1453   CL 105 06/13/2023 1453   CO2 31 06/13/2023 1453   BUN 17 06/13/2023 1453   CREATININE 1.53 (H) 06/13/2023 1453      Component Value Date/Time   CALCIUM 9.2 06/13/2023 1453   ALKPHOS 66 06/13/2023 1453   AST 11 (L) 06/13/2023 1453   ALT 10 06/13/2023 1453   BILITOT 0.4 06/13/2023 1453      Impression and Plan: Devin Schmidt is a very nice 83 year old white male.  He has hemochromatosis.  He has a major mutation.  He is homozygous for this.    I really think that everything is doing well for him.  We will see what his iron studies look like.  We will try to get him  through the Spring and most of the Summer.  I would like to see him back after Labor Day.       Josph Macho, MD 2/27/20253:17 PM

## 2023-12-20 ENCOUNTER — Encounter: Payer: Self-pay | Admitting: *Deleted

## 2023-12-20 LAB — IRON AND IRON BINDING CAPACITY (CC-WL,HP ONLY)
Iron: 192 ug/dL — ABNORMAL HIGH (ref 45–182)
Saturation Ratios: 73 % — ABNORMAL HIGH (ref 17.9–39.5)
TIBC: 262 ug/dL (ref 250–450)
UIBC: 70 ug/dL — ABNORMAL LOW (ref 117–376)

## 2024-02-04 ENCOUNTER — Encounter: Payer: Medicare Other | Admitting: Family Medicine

## 2024-03-10 ENCOUNTER — Encounter: Admitting: Family Medicine

## 2024-03-19 ENCOUNTER — Ambulatory Visit (INDEPENDENT_AMBULATORY_CARE_PROVIDER_SITE_OTHER): Admitting: Family Medicine

## 2024-03-19 ENCOUNTER — Encounter: Payer: Self-pay | Admitting: Family Medicine

## 2024-03-19 VITALS — BP 112/82 | HR 63 | Temp 97.7°F | Resp 16 | Ht 72.0 in | Wt 197.6 lb

## 2024-03-19 DIAGNOSIS — G250 Essential tremor: Secondary | ICD-10-CM

## 2024-03-19 DIAGNOSIS — Z0000 Encounter for general adult medical examination without abnormal findings: Secondary | ICD-10-CM

## 2024-03-19 DIAGNOSIS — J439 Emphysema, unspecified: Secondary | ICD-10-CM

## 2024-03-19 DIAGNOSIS — I259 Chronic ischemic heart disease, unspecified: Secondary | ICD-10-CM | POA: Diagnosis not present

## 2024-03-19 LAB — COMPREHENSIVE METABOLIC PANEL WITH GFR
ALT: 12 U/L (ref 0–53)
AST: 14 U/L (ref 0–37)
Albumin: 4.3 g/dL (ref 3.5–5.2)
Alkaline Phosphatase: 73 U/L (ref 39–117)
BUN: 17 mg/dL (ref 6–23)
CO2: 30 meq/L (ref 19–32)
Calcium: 9 mg/dL (ref 8.4–10.5)
Chloride: 102 meq/L (ref 96–112)
Creatinine, Ser: 1.37 mg/dL (ref 0.40–1.50)
GFR: 47.1 mL/min — ABNORMAL LOW (ref >60.00)
Glucose, Bld: 82 mg/dL (ref 70–99)
Potassium: 4.6 meq/L (ref 3.5–5.1)
Sodium: 138 meq/L (ref 135–145)
Total Bilirubin: 0.7 mg/dL (ref 0.2–1.2)
Total Protein: 6.4 g/dL (ref 6.0–8.3)

## 2024-03-19 LAB — CBC WITH DIFFERENTIAL/PLATELET
Basophils Absolute: 0 10*3/uL (ref 0.0–0.1)
Basophils Relative: 0.6 % (ref 0.0–3.0)
Eosinophils Absolute: 0.3 10*3/uL (ref 0.0–0.7)
Eosinophils Relative: 6.1 % — ABNORMAL HIGH (ref 0.0–5.0)
HCT: 48.2 % (ref 39.0–52.0)
Hemoglobin: 16.1 g/dL (ref 13.0–17.0)
Lymphocytes Relative: 14.9 % (ref 12.0–46.0)
Lymphs Abs: 0.8 10*3/uL (ref 0.7–4.0)
MCHC: 33.5 g/dL (ref 30.0–36.0)
MCV: 98.8 fl (ref 78.0–100.0)
Monocytes Absolute: 0.7 10*3/uL (ref 0.1–1.0)
Monocytes Relative: 12.1 % — ABNORMAL HIGH (ref 3.0–12.0)
Neutro Abs: 3.7 10*3/uL (ref 1.4–7.7)
Neutrophils Relative %: 66.3 % (ref 43.0–77.0)
Platelets: 153 10*3/uL (ref 150.0–400.0)
RBC: 4.88 Mil/uL (ref 4.22–5.81)
RDW: 13.2 % (ref 11.5–15.5)
WBC: 5.5 10*3/uL (ref 4.0–10.5)

## 2024-03-19 LAB — TSH: TSH: 2.44 u[IU]/mL (ref 0.35–5.50)

## 2024-03-19 LAB — LIPID PANEL
Cholesterol: 183 mg/dL (ref 0–200)
HDL: 55.2 mg/dL (ref >39.00)
LDL Cholesterol: 89 mg/dL (ref 0–99)
NonHDL: 127.33
Total CHOL/HDL Ratio: 3
Triglycerides: 190 mg/dL — ABNORMAL HIGH (ref 0.0–149.0)
VLDL: 38 mg/dL (ref 0.0–40.0)

## 2024-03-19 LAB — PSA: PSA: 1.01 ng/mL (ref 0.10–4.00)

## 2024-03-19 MED ORDER — FLUTICASONE-SALMETEROL 100-50 MCG/ACT IN AEPB: 1.0000 | INHALATION_SPRAY | Freq: Two times a day (BID) | RESPIRATORY_TRACT | 3 refills | Status: AC

## 2024-03-19 MED ORDER — PRIMIDONE 50 MG PO TABS
50.0000 mg | ORAL_TABLET | Freq: Every day | ORAL | 1 refills | Status: DC
Start: 2024-03-19 — End: 2024-09-15

## 2024-03-19 MED ORDER — TRIAMCINOLONE ACETONIDE 0.1 % EX CREA: 1.0000 | TOPICAL_CREAM | Freq: Two times a day (BID) | CUTANEOUS | 2 refills | Status: AC

## 2024-03-19 NOTE — Progress Notes (Signed)
 Established Patient Office Visit  Subjective   Patient ID: Devin Schmidt, male    DOB: Apr 21, 1941  Age: 83 y.o. MRN: [id number]  Chief Complaint  Patient presents with   Annual Exam    Pt states fasting     HPI Discussed the use of AI scribe software for clinical note transcription with the patient, who gave verbal consent to proceed.  History of Present Illness Devin Schmidt is an 83 year old male who presents for an annual physical exam.  He requires a refill of a medication prescribed last year and uses pyridine to manage ear wax buildup and dryness in his ears. He applies it directly to the tip of his ear to prevent drying and wax formation.  He experiences small white marks on his forearm, which appear when he is not exposed to the sun, particularly during golf. He has not discussed these with a dermatologist, although he has seen one in the past for other issues.  He has a history of COPD, diagnosed several years ago following a lung x-ray. He experiences shortness of breath primarily during exertion, such as exercise, but not at rest. He exercises two to three times a week, engaging in moderate activity, and plays golf twice a week using a cart. No wheezing is noted, and he maintains a routine of 20 minutes of exercise daily.  He is hard of hearing and uses hearing aids. He visits the dentist twice a year but has not seen an eye doctor in four years. He was informed by his eye doctor that he would never develop cataracts, but that he may need glasses.  He has not smoked and reports no changes in family history or new surgeries. He notes that his shortness of breath began around the time of COVID-19.   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   Dizziness 01/22/2023   Suspicious nevus 01/22/2023   DOE (dyspnea on exertion) 01/22/2023   Pulmonary emphysema  (HCC) 01/22/2023   Goals of care, counseling/discussion 09/25/2021   Rectal pain 07/12/2020   Nocturia 09/14/2019   Preventative health care 09/14/2019   Acute serous otitis media of left ear 06/19/2018   Ischemic heart disease screen 11/26/2016   Anal fissure - posterior 10/10/2016   External hemorrhoid 01/04/2016   Hematuria, gross 02/02/2015   S/P inguinal hernia repair 02/01/2015   Back pain 12/22/2014   History of colonic polyps 04/15/2014   Hemorrhoids 12/24/2013   Essential tremor 11/20/2012   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   Tremor, essential 04/30/2011   LYMPHOPENIA 12/08/2010   Hereditary hemochromatosis (HCC) 08/15/2007   SEBORRHEIC KERATOSIS, INFLAMED 08/15/2007   Past Medical History:  Diagnosis Date   Allergy    Anal fissure - posterior 10/10/2016   Colon polyps    Tubular Adenoma and Hyperplastic    COPD (chronic obstructive pulmonary disease) (HCC)    Epididymitis 01/03/2015   Goals of care, counseling/discussion 09/25/2021   Hemochromatosis    HX OF   Inflamed seborrheic keratosis    Sinusitis, acute maxillary 04/29/2015   URI (upper respiratory infection)    Past Surgical History:  Procedure Laterality Date   APPENDECTOMY  1950's   COLONOSCOPY W/ BIOPSIES  10/20/2008   X 2   HEMORRHOID BANDING     INGUINAL HERNIA REPAIR Right 02/01/2015   INGUINAL HERNIA REPAIR N/A 02/01/2015  Procedure: LAPAROSCOPIC RIGHT INGUINAL HERNIA REPAIR;  Surgeon: Arvin Laundry, MD;  Location: [facility] OR;  Service: General;  Laterality: N/A;   INSERTION OF MESH Right 02/01/2015   Procedure: INSERTION OF MESH;  Surgeon: Arvin Laundry, MD;  Location: [facility] OR;  Service: General;  Laterality: Right;   TONSILLECTOMY  1950's   Social History   Tobacco Use   Smoking status: Former    Current packs/day: 0.00    Average packs/day: 2.0 packs/day for 55.0 years (110.0 ttl pk-yrs)    Types: Cigarettes    Start date: 08/14/1954    Quit date: 08/14/2009    Years since  quitting: 14.6   Smokeless tobacco: Never   Tobacco comments:    Counseled to remain smoke free.  Vaping Use   Vaping status: Never Used  Substance Use Topics   Alcohol use: Yes    Alcohol/week: 2.0 standard drinks of alcohol    Types: 2 Standard drinks or equivalent per week    Comment: 02/01/2015 rare (one time per month)   Drug use: No   Social History   Socioeconomic History   Marital status: Divorced    Spouse name: Not on file   Number of children: 1   Years of education: Not on file   Highest education level: Bachelor's degree (e.g., BA, AB, BS)  Occupational History   Occupation: RETIRED    Employer: RETIRED  Tobacco Use   Smoking status: Former    Current packs/day: 0.00    Average packs/day: 2.0 packs/day for 55.0 years (110.0 ttl pk-yrs)    Types: Cigarettes    Start date: 08/14/1954    Quit date: 08/14/2009    Years since quitting: 14.6   Smokeless tobacco: Never   Tobacco comments:    Counseled to remain smoke free.  Vaping Use   Vaping status: Never Used  Substance and Sexual Activity   Alcohol use: Yes    Alcohol/week: 2.0 standard drinks of alcohol    Types: 2 Standard drinks or equivalent per week    Comment: 02/01/2015 rare (one time per month)   Drug use: No   Sexual activity: Not Currently    Partners: Female  Other Topics Concern   Not on file  Social History Narrative   Exercise-- , except golf, walking    Live with girlfriend   Social Drivers of Health   Financial Resource Strain: Low Risk  (03/12/2024)   Overall Financial Resource Strain (CARDIA)    Difficulty of Paying Living Expenses: Not very hard  Food Insecurity: No Food Insecurity (03/12/2024)   Hunger Vital Sign    Worried About Running Out of Food in the Last Year: Never true    Ran Out of Food in the Last Year: Never true  Transportation Needs: No Transportation Needs (03/12/2024)   PRAPARE - Administrator, Civil Service (Medical): No    Lack of Transportation  (Non-Medical): No  Physical Activity: Insufficiently Active (03/12/2024)   Exercise Vital Sign    Days of Exercise per Week: 2 days    Minutes of Exercise per Session: 20 min  Stress: No Stress Concern Present (03/12/2024)   [employer] of Occupational Health - Occupational Stress Questionnaire    Feeling of Stress : Only a little  Social Connections: Moderately Isolated (03/12/2024)   Social Connection and Isolation Panel [NHANES]    Frequency of Communication with Friends and Family: Once a week    Frequency of Social Gatherings with Friends and Family: Never  Attends Religious Services: More than 4 times per year    Active Member of Clubs or Organizations: No    Attends Engineer, structural: Not on file    Marital Status: Living with partner  Intimate Partner Violence: Not on file   Family Status  Relation Name Status   Mother  Deceased at age 47       old age   Father  Deceased at age 29       MVA   Brother  Deceased at age 52       detached retina metastatic bladder cancer   Child  Alive       healthy   Neg Hx  (Not Specified)  No partnership data on file   Family History  Problem Relation Age of Onset   Cancer Brother    Colon cancer Neg Hx    Pancreatic cancer Neg Hx    Rectal cancer Neg Hx    Stomach cancer Neg Hx    Esophageal cancer Neg Hx    Liver cancer Neg Hx    No Known Allergies    Review of Systems  Constitutional:  Negative for fever and malaise/fatigue.  HENT:  Negative for congestion.   Eyes:  Negative for blurred vision.  Respiratory:  Negative for cough and shortness of breath.   Cardiovascular:  Negative for chest pain, palpitations and leg swelling.  Gastrointestinal:  Negative for abdominal pain, blood in stool, nausea and vomiting.  Genitourinary:  Negative for dysuria and frequency.  Musculoskeletal:  Negative for back pain and falls.  Skin:  Negative for rash.  Neurological:  Negative for dizziness, loss of consciousness  and headaches.  Endo/Heme/Allergies:  Negative for environmental allergies.  Psychiatric/Behavioral:  Negative for depression. The patient is not nervous/anxious.       Objective:     BP 112/82 (BP Location: Left Arm, Patient Position: Sitting, Cuff Size: Normal)   Pulse 63   Temp 97.7 F (36.5 C) (Oral)   Resp 16   Ht 6' (1.829 m)   Wt 197 lb 9.6 oz (89.6 kg)   SpO2 97%   BMI 26.80 kg/m  BP Readings from Last 3 Encounters:  03/19/24 112/82  12/19/23 (!) 141/59  06/13/23 139/73   Wt Readings from Last 3 Encounters:  03/19/24 197 lb 9.6 oz (89.6 kg)  12/19/23 200 lb 6.4 oz (90.9 kg)  06/13/23 195 lb 12.8 oz (88.8 kg)   SpO2 Readings from Last 3 Encounters:  03/19/24 97%  12/19/23 100%  06/13/23 100%      Physical Exam Vitals and nursing note reviewed.  Constitutional:      General: He is not in acute distress.    Appearance: Normal appearance. He is well-developed.  HENT:     Head: Normocephalic and atraumatic.  Eyes:     General: No scleral icterus.       Right eye: No discharge.        Left eye: No discharge.  Cardiovascular:     Rate and Rhythm: Normal rate and regular rhythm.     Heart sounds: No murmur heard. Pulmonary:     Effort: Pulmonary effort is normal. No respiratory distress.     Breath sounds: Normal breath sounds.  Musculoskeletal:        General: Normal range of motion.     Cervical back: Normal range of motion and neck supple.     Right lower leg: No edema.     Left lower leg: No  edema.  Skin:    General: Skin is warm and dry.  Neurological:     Mental Status: He is alert and oriented to person, place, and time.  Psychiatric:        Mood and Affect: Mood normal.        Behavior: Behavior normal.        Thought Content: Thought content normal.        Judgment: Judgment normal.      No results found for any visits on 03/19/24.  Last CBC Lab Results  Component Value Date   WBC 6.5 12/19/2023   HGB 16.3 12/19/2023   HCT 48.1  12/19/2023   MCV 99.6 12/19/2023   MCH 33.7 12/19/2023   RDW 12.4 12/19/2023   PLT 170 12/19/2023   Last metabolic panel Lab Results  Component Value Date   GLUCOSE 77 12/19/2023   NA 142 12/19/2023   K 4.8 12/19/2023   CL 106 12/19/2023   CO2 31 12/19/2023   BUN 19 12/19/2023   CREATININE 1.57 (H) 12/19/2023   GFRNONAA 43 (L) 12/19/2023   CALCIUM 9.7 12/19/2023   PROT 6.2 (L) 12/19/2023   ALBUMIN 4.4 12/19/2023   BILITOT 0.4 12/19/2023   ALKPHOS 64 12/19/2023   AST 12 (L) 12/19/2023   ALT 12 12/19/2023   ANIONGAP 5 12/19/2023   Last lipids Lab Results  Component Value Date   CHOL 161 01/22/2023   HDL 50.00 01/22/2023   LDLCALC 86 01/22/2023   LDLDIRECT 72.0 09/14/2019   TRIG 123.0 01/22/2023   CHOLHDL 3 01/22/2023   Last hemoglobin A1c No results found for: "HGBA1C" Last thyroid  functions Lab Results  Component Value Date   TSH 1.19 01/22/2023   Last vitamin D  Lab Results  Component Value Date   VD25OH 31 11/20/2012   Last vitamin B12 and Folate Lab Results  Component Value Date   VITAMINB12 1,020 (H) 11/20/2012   FOLATE >24.8 11/20/2012      The ASCVD Risk score (Arnett DK, et al., 2019) failed to calculate for the following reasons:   The 2019 ASCVD risk score is only valid for ages 54 to 57    Assessment & Plan:   Problem List Items Addressed This Visit       Unprioritized   Tremor, essential   Relevant Orders   Lipid panel   PSA   TSH   Comprehensive metabolic panel with GFR   CBC with Differential/Platelet   Pulmonary emphysema (HCC)   Relevant Medications   fluticasone -salmeterol (ADVAIR) 100-50 MCG/ACT AEPB   Preventative health care - Primary   Relevant Orders   Lipid panel   PSA   TSH   Comprehensive metabolic panel with GFR   CBC with Differential/Platelet   Hereditary hemochromatosis (HCC)   Relevant Orders   Lipid panel   PSA   TSH   Comprehensive metabolic panel with GFR   CBC with Differential/Platelet    Essential tremor   Relevant Medications   primidone  (MYSOLINE ) 50 MG tablet   Other Visit Diagnoses       Ischemic heart disease       Relevant Orders   Lipid panel   PSA   TSH   Comprehensive metabolic panel with GFR   CBC with Differential/Platelet     Assessment and Plan Assessment & Plan Chronic Obstructive Pulmonary Disease (COPD)   COPD presents with exertional dyspnea, likely exacerbated post-COVID. No wheezing is noted. Symptoms include shortness of breath during exercise, not at rest. COPD  is confirmed by previous chest x-ray. Discussed using an inhaler before exertion to improve exercise tolerance. Check inhaler cost and coverage. Prescribe inhaler for use prior to exertion.  Keratosis   White marks on the forearm suggest keratosis. Dermatological evaluation is pending. Recommend a skin check for evaluation. Refer to a dermatologist for further assessment.    No follow-ups on file.    Yony Roulston R Lowne Chase, DO

## 2024-03-23 ENCOUNTER — Ambulatory Visit: Payer: Self-pay | Admitting: Family Medicine

## 2024-03-31 ENCOUNTER — Telehealth: Payer: Self-pay | Admitting: *Deleted

## 2024-03-31 NOTE — Telephone Encounter (Signed)
-----   Message from Estill Hemming sent at 03/31/2024  2:08 PM EDT ----- Its been a while since he has seen Dr Tat for his tremor --- -he should probably see her again for f/u Ok to place new referral if needed

## 2024-03-31 NOTE — Telephone Encounter (Signed)
 Attempted to reach pt twice. Unable to reach pt. Voicemail was not available to leave a message.

## 2024-07-02 ENCOUNTER — Inpatient Hospital Stay: Payer: Medicare Other | Attending: Hematology & Oncology

## 2024-07-02 ENCOUNTER — Encounter: Payer: Self-pay | Admitting: Hematology & Oncology

## 2024-07-02 ENCOUNTER — Inpatient Hospital Stay: Payer: Medicare Other | Admitting: Hematology & Oncology

## 2024-07-02 LAB — CBC WITH DIFFERENTIAL (CANCER CENTER ONLY)
Abs Immature Granulocytes: 0.06 K/uL (ref 0.00–0.07)
Basophils Absolute: 0.1 K/uL (ref 0.0–0.1)
Basophils Relative: 1 %
Eosinophils Absolute: 0.3 K/uL (ref 0.0–0.5)
Eosinophils Relative: 5 %
HCT: 46.3 % (ref 39.0–52.0)
Hemoglobin: 15.6 g/dL (ref 13.0–17.0)
Immature Granulocytes: 1 %
Lymphocytes Relative: 14 %
Lymphs Abs: 0.9 K/uL (ref 0.7–4.0)
MCH: 33.4 pg (ref 26.0–34.0)
MCHC: 33.7 g/dL (ref 30.0–36.0)
MCV: 99.1 fL (ref 80.0–100.0)
Monocytes Absolute: 0.8 K/uL (ref 0.1–1.0)
Monocytes Relative: 13 %
Neutro Abs: 4.5 K/uL (ref 1.7–7.7)
Neutrophils Relative %: 66 %
Platelet Count: 156 K/uL (ref 150–400)
RBC: 4.67 MIL/uL (ref 4.22–5.81)
RDW: 12.6 % (ref 11.5–15.5)
WBC Count: 6.7 K/uL (ref 4.0–10.5)
nRBC: 0 % (ref 0.0–0.2)

## 2024-07-02 LAB — CMP (CANCER CENTER ONLY)
ALT: 11 U/L (ref 0–44)
AST: 18 U/L (ref 15–41)
Albumin: 4.2 g/dL (ref 3.5–5.0)
Alkaline Phosphatase: 75 U/L (ref 38–126)
Anion gap: 9 (ref 5–15)
BUN: 18 mg/dL (ref 8–23)
CO2: 28 mmol/L (ref 22–32)
Calcium: 9.1 mg/dL (ref 8.9–10.3)
Chloride: 106 mmol/L (ref 98–111)
Creatinine: 1.48 mg/dL — ABNORMAL HIGH (ref 0.61–1.24)
GFR, Estimated: 46 mL/min — ABNORMAL LOW (ref >60)
Glucose, Bld: 90 mg/dL (ref 70–99)
Potassium: 4.9 mmol/L (ref 3.5–5.1)
Sodium: 143 mmol/L (ref 135–145)
Total Bilirubin: 0.4 mg/dL (ref 0.0–1.2)
Total Protein: 6.5 g/dL (ref 6.5–8.1)

## 2024-07-02 LAB — IRON AND IRON BINDING CAPACITY (CC-WL,HP ONLY)
Iron: 204 ug/dL — ABNORMAL HIGH (ref 45–182)
Saturation Ratios: 87 % — ABNORMAL HIGH (ref 17.9–39.5)
TIBC: 235 ug/dL — ABNORMAL LOW (ref 250–450)
UIBC: 31 ug/dL

## 2024-07-02 LAB — FERRITIN: Ferritin: 95 ng/mL (ref 24–336)

## 2024-07-02 LAB — RETICULOCYTES
Immature Retic Fract: 6.5 % (ref 2.3–15.9)
RBC.: 4.58 MIL/uL (ref 4.22–5.81)
Retic Count, Absolute: 52.2 K/uL (ref 19.0–186.0)
Retic Ct Pct: 1.1 % (ref 0.4–3.1)

## 2024-07-02 NOTE — Progress Notes (Signed)
 Hematology and Oncology Follow Up Visit  Devin Schmidt [id number] [birth year] 83 y.o. 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 --He donates blood to [facility] every 3-4 months.     Interim History:  Devin Schmidt is back for follow-up.  We last saw him 6 months ago.  So far, everything is doing so well for him.  He has had no problems since we last saw him.  He has been playing golf.  He is he was up in [place].  When we last saw him, his ferritin was 27 with an iron saturation of 73%.  He has had no problems with donating blood.  He goes to donate blood every 3 months.  He has had no change in bowel or bladder habits.  He has had no rashes.  There is been no leg swelling.  He has had no headache.  He has had no issues with COVID.  Overall, I would say that his performance status is probably ECOG 0.     Medications:  Current Outpatient Medications:    fluticasone -salmeterol (ADVAIR) 100-50 MCG/ACT AEPB, Inhale 1 puff into the lungs 2 (two) times daily., Disp: 1 each, Rfl: 3   primidone  (MYSOLINE ) 50 MG tablet, Take 1 tablet (50 mg total) by mouth at bedtime., Disp: 90 tablet, Rfl: 1   triamcinolone  cream (KENALOG ) 0.1 %, Apply 1 Application topically 2 (two) times daily., Disp: 30 g, Rfl: 2  Allergies: No Known Allergies  Past Medical History, Surgical history, Social history, and Family History were reviewed and updated.  Review of Systems: Review of Systems  Constitutional: Negative.   HENT:  Negative.    Eyes: Negative.   Respiratory: Negative.    Cardiovascular: Negative.   Gastrointestinal: Negative.   Endocrine: Negative.   Genitourinary: Negative.    Musculoskeletal: Negative.   Skin: Negative.   Neurological: Negative.   Hematological: Negative.   Psychiatric/Behavioral: Negative.      Physical Exam:   weight is 193 lb (87.5 kg). His oral temperature is 98.3 F (36.8 C). His blood pressure is 157/73 (abnormal) and his pulse is 70. His respiration is 17 and oxygen saturation is 100%.   Wt Readings from Last 3 Encounters:  07/02/24 193 lb (87.5 kg)  03/19/24 197 lb 9.6 oz (89.6 kg)  12/19/23 200 lb 6.4 oz (90.9 kg)    Physical Exam Vitals reviewed.  HENT:     Head: Normocephalic and atraumatic.  Eyes:     Pupils: Pupils are equal, round, and reactive to light.  Cardiovascular:     Rate and Rhythm: Normal rate and regular rhythm.     Heart sounds: Normal heart sounds.  Pulmonary:     Effort: Pulmonary effort is normal.     Breath sounds: Normal breath sounds.  Abdominal:     General: Bowel sounds are normal.     Palpations: Abdomen is soft.  Musculoskeletal:        General: No tenderness or deformity. Normal range of motion.     Cervical back: Normal range of motion.  Lymphadenopathy:     Cervical: No cervical adenopathy.  Skin:    General: Skin is warm and dry.     Findings: No erythema or rash.  Neurological:     Mental Status: He is alert and oriented to person, place, and time.  Psychiatric:  Behavior: Behavior normal.        Thought Content: Thought content normal.        Judgment: Judgment normal.    Lab Results  Component Value Date   WBC 6.7 07/02/2024   HGB 15.6 07/02/2024   HCT 46.3 07/02/2024   MCV 99.1 07/02/2024   PLT 156 07/02/2024     Chemistry      Component Value Date/Time   NA 143 07/02/2024 1503   K 4.9 07/02/2024 1503   CL 106 07/02/2024 1503   CO2 28 07/02/2024 1503   BUN 18 07/02/2024 1503   CREATININE 1.48 (H) 07/02/2024 1503      Component Value Date/Time   CALCIUM 9.1 07/02/2024 1503   ALKPHOS 75 07/02/2024 1503   AST 18 07/02/2024 1503   ALT 11 07/02/2024 1503   BILITOT 0.4 07/02/2024 1503      Impression and Plan: Devin Schmidt is a very nice 83 year old white male.  He has hemochromatosis.  He has a major mutation.  He  is homozygous for this.    I cannot imagine that his iron levels can be high.  He has really done nicely.  For right now, I think we can probably go 1 year.  He would like this.  I told him that he can always come back sooner if he has any problems.      Maude JONELLE Crease, MD 9/11/20253:58 PM

## 2024-07-03 ENCOUNTER — Ambulatory Visit: Payer: Self-pay | Admitting: Hematology & Oncology

## 2024-07-03 NOTE — Telephone Encounter (Signed)
-----   Message from Maude JONELLE Crease sent at 07/03/2024  8:06 AM EDT ----- Please call and let him know that the iron levels were too high.  He needs to donate more often for the [employer].  Please try to see if he can donate every 2 months.  Thanks.  Jeralyn ----- Message ----- From: Rebecka, [facility] [place] Sent: 07/02/2024   3:16 PM EDT To: Maude JONELLE Crease, MD

## 2024-07-03 NOTE — Telephone Encounter (Signed)
 Advised via MyChart.

## 2024-09-07 ENCOUNTER — Other Ambulatory Visit: Payer: Self-pay | Admitting: Family Medicine

## 2024-09-15 ENCOUNTER — Other Ambulatory Visit: Payer: Self-pay | Admitting: Family Medicine

## 2024-09-15 DIAGNOSIS — G250 Essential tremor: Secondary | ICD-10-CM

## 2024-09-15 MED ORDER — PRIMIDONE 50 MG PO TABS: 50.0000 mg | ORAL_TABLET | Freq: Every day | ORAL | 1 refills | Status: AC

## 2024-09-15 NOTE — Telephone Encounter (Signed)
 Copied from CRM [id number]. Topic: Clinical - Medication Refill >> Sep 15, 2024  8:29 AM Robinson H wrote: Medication: primidone  (MYSOLINE ) 50 MG tablet  Has the patient contacted their pharmacy? No, bottle states patient has to call for refills (Agent: If no, request that the patient contact the pharmacy for the refill. If patient does not wish to contact the pharmacy document the reason why and proceed with request.) (Agent: If yes, when and what did the pharmacy advise?)  This is the patient's preferred pharmacy:  [facility] [address], [place] - [address] [place] [address] Phone: [phone] Fax: [phone]  Is this the correct pharmacy for this prescription? Yes If no, delete pharmacy and type the correct one.   Has the prescription been filled recently? No  Is the patient out of the medication? No  Has the patient been seen for an appointment in the last year OR does the patient have an upcoming appointment? Yes  Can we respond through MyChart? No  Agent: Please be advised that Rx refills may take up to 3 business days. We ask that you follow-up with your pharmacy.

## 2024-11-05 ENCOUNTER — Telehealth: Payer: Self-pay

## 2024-11-05 NOTE — Telephone Encounter (Signed)
 Received phone call from patient stating that his prescription for phlebotomies at [facility] will expire in one week. Pt requesting an updated prescription be faxed to [facility] so he can continue to his treatment plan. This RN reviewed pt request with Dr. Timmy who wanted pt to continue with phlebotomies every 2 months. This RN faxed new prescription to [facility] with confirmation receipt received. Pt aware and had no further questions and appreciative of help.

## 2025-03-23 ENCOUNTER — Encounter: Admitting: Family Medicine

## 2025-07-01 ENCOUNTER — Inpatient Hospital Stay

## 2025-07-01 ENCOUNTER — Ambulatory Visit: Admitting: Hematology & Oncology
